# Patient Record
Sex: Female | Born: 1940
Health system: Southern US, Community
[De-identification: ages and names within clinical notes are randomized; demographics above are authoritative.]

## PROBLEM LIST (undated history)

## (undated) DIAGNOSIS — Z8719 Personal history of other diseases of the digestive system: Secondary | ICD-10-CM

## (undated) DIAGNOSIS — Z923 Personal history of irradiation: Secondary | ICD-10-CM

## (undated) DIAGNOSIS — E559 Vitamin D deficiency, unspecified: Secondary | ICD-10-CM

## (undated) DIAGNOSIS — C50919 Malignant neoplasm of unspecified site of unspecified female breast: Secondary | ICD-10-CM

## (undated) DIAGNOSIS — K219 Gastro-esophageal reflux disease without esophagitis: Secondary | ICD-10-CM

## (undated) DIAGNOSIS — E039 Hypothyroidism, unspecified: Secondary | ICD-10-CM

## (undated) DIAGNOSIS — D649 Anemia, unspecified: Secondary | ICD-10-CM

## (undated) DIAGNOSIS — Z8669 Personal history of other diseases of the nervous system and sense organs: Secondary | ICD-10-CM

## (undated) DIAGNOSIS — Z9889 Other specified postprocedural states: Secondary | ICD-10-CM

## (undated) DIAGNOSIS — M1990 Unspecified osteoarthritis, unspecified site: Secondary | ICD-10-CM

## (undated) DIAGNOSIS — R112 Nausea with vomiting, unspecified: Secondary | ICD-10-CM

## (undated) HISTORY — PX: BREAST EXCISIONAL BIOPSY: SUR124

## (undated) HISTORY — PX: BREAST LUMPECTOMY: SHX2

## (undated) HISTORY — PX: CATARACT EXTRACTION, BILATERAL: SHX1313

---

## 1998-02-16 ENCOUNTER — Ambulatory Visit (HOSPITAL_COMMUNITY): Admission: RE | Admit: 1998-02-16 | Discharge: 1998-02-16 | Payer: Self-pay | Admitting: *Deleted

## 2000-04-22 ENCOUNTER — Other Ambulatory Visit: Admission: RE | Admit: 2000-04-22 | Discharge: 2000-04-22 | Payer: Self-pay | Admitting: Obstetrics and Gynecology

## 2000-05-07 ENCOUNTER — Encounter: Admission: RE | Admit: 2000-05-07 | Discharge: 2000-05-07 | Payer: Self-pay | Admitting: Obstetrics and Gynecology

## 2000-05-07 ENCOUNTER — Encounter: Payer: Self-pay | Admitting: Obstetrics and Gynecology

## 2001-07-03 ENCOUNTER — Other Ambulatory Visit: Admission: RE | Admit: 2001-07-03 | Discharge: 2001-07-03 | Payer: Self-pay | Admitting: Obstetrics and Gynecology

## 2001-07-08 ENCOUNTER — Encounter: Admission: RE | Admit: 2001-07-08 | Discharge: 2001-07-08 | Payer: Self-pay | Admitting: Obstetrics and Gynecology

## 2001-07-08 ENCOUNTER — Encounter: Payer: Self-pay | Admitting: Obstetrics and Gynecology

## 2002-08-20 ENCOUNTER — Other Ambulatory Visit: Admission: RE | Admit: 2002-08-20 | Discharge: 2002-08-20 | Payer: Self-pay | Admitting: Obstetrics and Gynecology

## 2004-01-07 ENCOUNTER — Other Ambulatory Visit: Admission: RE | Admit: 2004-01-07 | Discharge: 2004-01-07 | Payer: Self-pay | Admitting: Obstetrics and Gynecology

## 2005-08-20 ENCOUNTER — Ambulatory Visit (HOSPITAL_COMMUNITY): Admission: RE | Admit: 2005-08-20 | Discharge: 2005-08-20 | Payer: Self-pay | Admitting: Family Medicine

## 2006-06-12 ENCOUNTER — Encounter: Admission: RE | Admit: 2006-06-12 | Discharge: 2006-06-12 | Payer: Self-pay | Admitting: Obstetrics and Gynecology

## 2006-06-12 ENCOUNTER — Encounter (INDEPENDENT_AMBULATORY_CARE_PROVIDER_SITE_OTHER): Payer: Self-pay | Admitting: Diagnostic Radiology

## 2006-06-12 ENCOUNTER — Encounter (INDEPENDENT_AMBULATORY_CARE_PROVIDER_SITE_OTHER): Payer: Self-pay | Admitting: *Deleted

## 2006-06-21 ENCOUNTER — Encounter: Admission: RE | Admit: 2006-06-21 | Discharge: 2006-06-21 | Payer: Self-pay | Admitting: Obstetrics and Gynecology

## 2006-07-08 ENCOUNTER — Encounter: Admission: RE | Admit: 2006-07-08 | Discharge: 2006-07-08 | Payer: Self-pay | Admitting: General Surgery

## 2006-07-08 ENCOUNTER — Ambulatory Visit (HOSPITAL_BASED_OUTPATIENT_CLINIC_OR_DEPARTMENT_OTHER): Admission: RE | Admit: 2006-07-08 | Discharge: 2006-07-08 | Payer: Self-pay | Admitting: General Surgery

## 2006-07-08 ENCOUNTER — Encounter (INDEPENDENT_AMBULATORY_CARE_PROVIDER_SITE_OTHER): Payer: Self-pay | Admitting: *Deleted

## 2006-07-09 ENCOUNTER — Ambulatory Visit: Payer: Self-pay | Admitting: Oncology

## 2006-07-15 ENCOUNTER — Ambulatory Visit: Admission: RE | Admit: 2006-07-15 | Discharge: 2006-11-06 | Payer: Self-pay | Admitting: Radiation Oncology

## 2006-07-16 LAB — CBC WITH DIFFERENTIAL/PLATELET
Basophils Absolute: 0 10*3/uL (ref 0.0–0.1)
EOS%: 3.4 % (ref 0.0–7.0)
HCT: 40.4 % (ref 34.8–46.6)
HGB: 13.9 g/dL (ref 11.6–15.9)
MCH: 30.7 pg (ref 26.0–34.0)
MCV: 89.4 fL (ref 81.0–101.0)
MONO%: 7.8 % (ref 0.0–13.0)
NEUT%: 59.8 % (ref 39.6–76.8)

## 2006-07-22 ENCOUNTER — Ambulatory Visit (HOSPITAL_COMMUNITY): Admission: RE | Admit: 2006-07-22 | Discharge: 2006-07-22 | Payer: Self-pay | Admitting: Oncology

## 2006-10-11 ENCOUNTER — Ambulatory Visit: Admission: RE | Admit: 2006-10-11 | Discharge: 2006-11-06 | Payer: Self-pay | Admitting: Radiation Oncology

## 2006-12-04 ENCOUNTER — Ambulatory Visit: Payer: Self-pay | Admitting: Oncology

## 2006-12-09 LAB — LACTATE DEHYDROGENASE: LDH: 177 U/L (ref 94–250)

## 2006-12-09 LAB — CBC WITH DIFFERENTIAL/PLATELET
BASO%: 0.4 % (ref 0.0–2.0)
EOS%: 2.8 % (ref 0.0–7.0)
HCT: 38.2 % (ref 34.8–46.6)
LYMPH%: 22.7 % (ref 14.0–48.0)
MCH: 31.3 pg (ref 26.0–34.0)
MCHC: 34.9 g/dL (ref 32.0–36.0)
MONO#: 0.5 10*3/uL (ref 0.1–0.9)
NEUT%: 64.8 % (ref 39.6–76.8)
Platelets: 311 10*3/uL (ref 145–400)

## 2006-12-09 LAB — COMPREHENSIVE METABOLIC PANEL
Albumin: 4.1 g/dL (ref 3.5–5.2)
BUN: 19 mg/dL (ref 6–23)
CO2: 28 mEq/L (ref 19–32)
Calcium: 9.4 mg/dL (ref 8.4–10.5)
Chloride: 108 mEq/L (ref 96–112)
Creatinine, Ser: 0.79 mg/dL (ref 0.40–1.20)
Glucose, Bld: 115 mg/dL — ABNORMAL HIGH (ref 70–99)
Potassium: 5 mEq/L (ref 3.5–5.3)

## 2006-12-25 ENCOUNTER — Ambulatory Visit: Payer: Self-pay | Admitting: Gastroenterology

## 2007-01-03 ENCOUNTER — Ambulatory Visit: Payer: Self-pay | Admitting: Gastroenterology

## 2007-01-03 HISTORY — PX: COLONOSCOPY: SHX174

## 2007-03-07 ENCOUNTER — Ambulatory Visit: Payer: Self-pay | Admitting: Oncology

## 2007-04-25 ENCOUNTER — Ambulatory Visit: Payer: Self-pay | Admitting: Oncology

## 2007-06-16 ENCOUNTER — Encounter: Admission: RE | Admit: 2007-06-16 | Discharge: 2007-06-16 | Payer: Self-pay | Admitting: *Deleted

## 2007-07-24 ENCOUNTER — Ambulatory Visit: Payer: Self-pay | Admitting: Oncology

## 2007-08-01 LAB — CBC WITH DIFFERENTIAL/PLATELET
Basophils Absolute: 0 10*3/uL (ref 0.0–0.1)
Eosinophils Absolute: 0.1 10*3/uL (ref 0.0–0.5)
HGB: 13.8 g/dL (ref 11.6–15.9)
MONO%: 11.5 % (ref 0.0–13.0)
NEUT#: 3.8 10*3/uL (ref 1.5–6.5)
RBC: 4.48 10*6/uL (ref 3.70–5.32)
RDW: 12.9 % (ref 11.3–14.5)
WBC: 6.2 10*3/uL (ref 3.9–10.0)
lymph#: 1.6 10*3/uL (ref 0.9–3.3)

## 2007-08-01 LAB — COMPREHENSIVE METABOLIC PANEL
AST: 24 U/L (ref 0–37)
Albumin: 4.4 g/dL (ref 3.5–5.2)
Alkaline Phosphatase: 82 U/L (ref 39–117)
BUN: 23 mg/dL (ref 6–23)
Calcium: 9.6 mg/dL (ref 8.4–10.5)
Chloride: 107 mEq/L (ref 96–112)
Glucose, Bld: 77 mg/dL (ref 70–99)
Potassium: 4.3 mEq/L (ref 3.5–5.3)
Sodium: 143 mEq/L (ref 135–145)
Total Protein: 7 g/dL (ref 6.0–8.3)

## 2007-08-01 LAB — CANCER ANTIGEN 27.29: CA 27.29: 15 U/mL (ref 0–39)

## 2007-09-12 ENCOUNTER — Encounter: Admission: RE | Admit: 2007-09-12 | Discharge: 2007-09-12 | Payer: Self-pay | Admitting: Obstetrics and Gynecology

## 2007-09-15 ENCOUNTER — Ambulatory Visit: Payer: Self-pay | Admitting: Gastroenterology

## 2007-09-23 ENCOUNTER — Ambulatory Visit: Payer: Self-pay | Admitting: Gastroenterology

## 2007-09-23 ENCOUNTER — Encounter: Payer: Self-pay | Admitting: Gastroenterology

## 2007-09-23 HISTORY — PX: UPPER GI ENDOSCOPY: SHX6162

## 2007-10-27 ENCOUNTER — Ambulatory Visit: Payer: Self-pay | Admitting: Gastroenterology

## 2008-02-04 ENCOUNTER — Telehealth: Payer: Self-pay | Admitting: Gastroenterology

## 2008-02-05 ENCOUNTER — Encounter: Payer: Self-pay | Admitting: Family Medicine

## 2008-02-10 ENCOUNTER — Ambulatory Visit: Payer: Self-pay | Admitting: Oncology

## 2008-03-15 LAB — CBC WITH DIFFERENTIAL/PLATELET
Eosinophils Absolute: 0.1 10*3/uL (ref 0.0–0.5)
MCV: 89.6 fL (ref 81.0–101.0)
MONO%: 9.2 % (ref 0.0–13.0)
NEUT#: 3 10*3/uL (ref 1.5–6.5)
RBC: 4.31 10*6/uL (ref 3.70–5.32)
RDW: 13.1 % (ref 11.3–14.5)
WBC: 5 10*3/uL (ref 3.9–10.0)
lymph#: 1.4 10*3/uL (ref 0.9–3.3)

## 2008-03-16 LAB — COMPREHENSIVE METABOLIC PANEL
AST: 26 U/L (ref 0–37)
Albumin: 4.2 g/dL (ref 3.5–5.2)
Alkaline Phosphatase: 83 U/L (ref 39–117)
Glucose, Bld: 93 mg/dL (ref 70–99)
Potassium: 4.5 mEq/L (ref 3.5–5.3)
Sodium: 139 mEq/L (ref 135–145)
Total Protein: 6.7 g/dL (ref 6.0–8.3)

## 2008-03-16 LAB — CANCER ANTIGEN 27.29: CA 27.29: 17 U/mL (ref 0–39)

## 2008-06-16 ENCOUNTER — Encounter: Admission: RE | Admit: 2008-06-16 | Discharge: 2008-06-16 | Payer: Self-pay | Admitting: Oncology

## 2008-07-13 ENCOUNTER — Ambulatory Visit: Payer: Self-pay | Admitting: Oncology

## 2008-07-13 LAB — COMPREHENSIVE METABOLIC PANEL
AST: 23 U/L (ref 0–37)
Albumin: 4.2 g/dL (ref 3.5–5.2)
BUN: 23 mg/dL (ref 6–23)
CO2: 25 mEq/L (ref 19–32)
Calcium: 9.7 mg/dL (ref 8.4–10.5)
Chloride: 105 mEq/L (ref 96–112)
Glucose, Bld: 84 mg/dL (ref 70–99)
Potassium: 4.4 mEq/L (ref 3.5–5.3)

## 2008-07-13 LAB — CBC WITH DIFFERENTIAL/PLATELET
Basophils Absolute: 0 10*3/uL (ref 0.0–0.1)
Eosinophils Absolute: 0.1 10*3/uL (ref 0.0–0.5)
HCT: 40.7 % (ref 34.8–46.6)
HGB: 13.8 g/dL (ref 11.6–15.9)
MONO#: 0.5 10*3/uL (ref 0.1–0.9)
NEUT#: 3.6 10*3/uL (ref 1.5–6.5)
NEUT%: 63.4 % (ref 39.6–76.8)
RDW: 13.3 % (ref 11.3–14.5)
lymph#: 1.4 10*3/uL (ref 0.9–3.3)

## 2008-07-13 LAB — CANCER ANTIGEN 27.29: CA 27.29: 16 U/mL (ref 0–39)

## 2008-10-12 ENCOUNTER — Ambulatory Visit: Payer: Self-pay | Admitting: Oncology

## 2008-10-20 LAB — COMPREHENSIVE METABOLIC PANEL
AST: 29 U/L (ref 0–37)
Alkaline Phosphatase: 86 U/L (ref 39–117)
BUN: 27 mg/dL — ABNORMAL HIGH (ref 6–23)
Glucose, Bld: 91 mg/dL (ref 70–99)
Potassium: 3.8 mEq/L (ref 3.5–5.3)
Sodium: 142 mEq/L (ref 135–145)
Total Bilirubin: 0.6 mg/dL (ref 0.3–1.2)
Total Protein: 6.5 g/dL (ref 6.0–8.3)

## 2008-10-20 LAB — CBC WITH DIFFERENTIAL/PLATELET
EOS%: 1.2 % (ref 0.0–7.0)
Eosinophils Absolute: 0.1 10*3/uL (ref 0.0–0.5)
LYMPH%: 24.8 % (ref 14.0–49.7)
MCH: 30.9 pg (ref 25.1–34.0)
MCV: 90.9 fL (ref 79.5–101.0)
MONO%: 7.7 % (ref 0.0–14.0)
NEUT#: 4.2 10*3/uL (ref 1.5–6.5)
Platelets: 296 10*3/uL (ref 145–400)
RBC: 4.28 10*6/uL (ref 3.70–5.45)
RDW: 13.1 % (ref 11.2–14.5)

## 2008-10-22 LAB — CANCER ANTIGEN 27.29: CA 27.29: 17 U/mL (ref 0–39)

## 2008-10-22 LAB — VITAMIN D 25 HYDROXY (VIT D DEFICIENCY, FRACTURES): Vit D, 25-Hydroxy: 33 ng/mL (ref 30–89)

## 2009-04-25 ENCOUNTER — Ambulatory Visit: Payer: Self-pay | Admitting: Oncology

## 2009-04-26 ENCOUNTER — Encounter: Admission: RE | Admit: 2009-04-26 | Discharge: 2009-04-26 | Payer: Self-pay | Admitting: Obstetrics and Gynecology

## 2009-04-26 LAB — CBC WITH DIFFERENTIAL/PLATELET
BASO%: 0.4 % (ref 0.0–2.0)
Eosinophils Absolute: 0.1 10*3/uL (ref 0.0–0.5)
LYMPH%: 29.5 % (ref 14.0–49.7)
MCHC: 34.1 g/dL (ref 31.5–36.0)
MONO#: 0.5 10*3/uL (ref 0.1–0.9)
NEUT#: 3.2 10*3/uL (ref 1.5–6.5)
Platelets: 328 10*3/uL (ref 145–400)
RBC: 4.35 10*6/uL (ref 3.70–5.45)
RDW: 12.7 % (ref 11.2–14.5)
WBC: 5.3 10*3/uL (ref 3.9–10.3)
lymph#: 1.6 10*3/uL (ref 0.9–3.3)

## 2009-04-27 LAB — CANCER ANTIGEN 27.29: CA 27.29: 25 U/mL (ref 0–39)

## 2009-04-27 LAB — COMPREHENSIVE METABOLIC PANEL
ALT: 16 U/L (ref 0–35)
Albumin: 4.2 g/dL (ref 3.5–5.2)
CO2: 23 mEq/L (ref 19–32)
Calcium: 9.3 mg/dL (ref 8.4–10.5)
Chloride: 107 mEq/L (ref 96–112)
Glucose, Bld: 81 mg/dL (ref 70–99)
Potassium: 4 mEq/L (ref 3.5–5.3)
Sodium: 140 mEq/L (ref 135–145)
Total Bilirubin: 0.4 mg/dL (ref 0.3–1.2)
Total Protein: 6.8 g/dL (ref 6.0–8.3)

## 2009-04-27 LAB — LACTATE DEHYDROGENASE: LDH: 205 U/L (ref 94–250)

## 2009-08-22 DIAGNOSIS — R7301 Impaired fasting glucose: Secondary | ICD-10-CM | POA: Insufficient documentation

## 2009-08-22 DIAGNOSIS — E039 Hypothyroidism, unspecified: Secondary | ICD-10-CM | POA: Insufficient documentation

## 2009-08-22 DIAGNOSIS — G2581 Restless legs syndrome: Secondary | ICD-10-CM | POA: Insufficient documentation

## 2009-10-14 ENCOUNTER — Ambulatory Visit: Payer: Self-pay | Admitting: Oncology

## 2009-10-18 LAB — CANCER ANTIGEN 27.29: CA 27.29: 18 U/mL (ref 0–39)

## 2009-10-18 LAB — COMPREHENSIVE METABOLIC PANEL
AST: 25 U/L (ref 0–37)
Alkaline Phosphatase: 82 U/L (ref 39–117)
Glucose, Bld: 99 mg/dL (ref 70–99)
Sodium: 140 mEq/L (ref 135–145)
Total Bilirubin: 0.3 mg/dL (ref 0.3–1.2)
Total Protein: 6.5 g/dL (ref 6.0–8.3)

## 2009-10-18 LAB — CBC WITH DIFFERENTIAL/PLATELET
BASO%: 0.4 % (ref 0.0–2.0)
EOS%: 2.5 % (ref 0.0–7.0)
Eosinophils Absolute: 0.2 10*3/uL (ref 0.0–0.5)
LYMPH%: 28.9 % (ref 14.0–49.7)
MCH: 30.6 pg (ref 25.1–34.0)
MCHC: 33.9 g/dL (ref 31.5–36.0)
MCV: 90.4 fL (ref 79.5–101.0)
MONO%: 8.3 % (ref 0.0–14.0)
Platelets: 323 10*3/uL (ref 145–400)
RBC: 4.32 10*6/uL (ref 3.70–5.45)

## 2009-10-25 ENCOUNTER — Encounter: Payer: Self-pay | Admitting: Gastroenterology

## 2009-11-03 ENCOUNTER — Encounter: Admission: RE | Admit: 2009-11-03 | Discharge: 2009-11-03 | Payer: Self-pay | Admitting: Oncology

## 2009-12-14 ENCOUNTER — Ambulatory Visit: Payer: Self-pay | Admitting: Oncology

## 2009-12-16 ENCOUNTER — Encounter: Admission: RE | Admit: 2009-12-16 | Discharge: 2009-12-16 | Payer: Self-pay | Admitting: Oncology

## 2010-05-31 ENCOUNTER — Ambulatory Visit: Payer: Self-pay | Admitting: Oncology

## 2010-06-05 LAB — CBC WITH DIFFERENTIAL/PLATELET
BASO%: 0.4 % (ref 0.0–2.0)
Eosinophils Absolute: 0.1 10*3/uL (ref 0.0–0.5)
HCT: 39.2 % (ref 34.8–46.6)
MCHC: 34.5 g/dL (ref 31.5–36.0)
MONO#: 0.5 10*3/uL (ref 0.1–0.9)
NEUT#: 3.6 10*3/uL (ref 1.5–6.5)
NEUT%: 64.6 % (ref 38.4–76.8)
Platelets: 302 10*3/uL (ref 145–400)
RBC: 4.31 10*6/uL (ref 3.70–5.45)
WBC: 5.7 10*3/uL (ref 3.9–10.3)
lymph#: 1.4 10*3/uL (ref 0.9–3.3)

## 2010-06-05 LAB — COMPREHENSIVE METABOLIC PANEL
ALT: 21 U/L (ref 0–35)
Albumin: 4.1 g/dL (ref 3.5–5.2)
CO2: 26 mEq/L (ref 19–32)
Calcium: 9.4 mg/dL (ref 8.4–10.5)
Chloride: 105 mEq/L (ref 96–112)
Glucose, Bld: 94 mg/dL (ref 70–99)
Sodium: 141 mEq/L (ref 135–145)
Total Protein: 6.5 g/dL (ref 6.0–8.3)

## 2010-06-05 LAB — LACTATE DEHYDROGENASE: LDH: 175 U/L (ref 94–250)

## 2010-06-05 LAB — CANCER ANTIGEN 27.29: CA 27.29: 24 U/mL (ref 0–39)

## 2010-09-02 ENCOUNTER — Encounter: Payer: Self-pay | Admitting: Obstetrics and Gynecology

## 2010-09-03 ENCOUNTER — Encounter: Payer: Self-pay | Admitting: Oncology

## 2010-09-12 NOTE — Letter (Signed)
[facility]   Imported By: Lennie Odor 11/23/2009 15:33:20  _____________________________________________________________________  External Attachment:    Type:   Image     Comment:   External Document

## 2010-09-19 DIAGNOSIS — E785 Hyperlipidemia, unspecified: Secondary | ICD-10-CM | POA: Insufficient documentation

## 2010-11-24 ENCOUNTER — Other Ambulatory Visit: Payer: Self-pay | Admitting: Internal Medicine

## 2010-11-24 DIAGNOSIS — Z9889 Other specified postprocedural states: Secondary | ICD-10-CM

## 2010-12-05 ENCOUNTER — Other Ambulatory Visit: Payer: Self-pay | Admitting: Oncology

## 2010-12-05 ENCOUNTER — Encounter (HOSPITAL_BASED_OUTPATIENT_CLINIC_OR_DEPARTMENT_OTHER): Payer: Medicare Other | Admitting: Oncology

## 2010-12-05 DIAGNOSIS — Z853 Personal history of malignant neoplasm of breast: Secondary | ICD-10-CM

## 2010-12-05 DIAGNOSIS — Z170 Estrogen receptor positive status [ER+]: Secondary | ICD-10-CM

## 2010-12-05 DIAGNOSIS — C50919 Malignant neoplasm of unspecified site of unspecified female breast: Secondary | ICD-10-CM

## 2010-12-05 LAB — COMPREHENSIVE METABOLIC PANEL
Albumin: 4.4 g/dL (ref 3.5–5.2)
BUN: 24 mg/dL — ABNORMAL HIGH (ref 6–23)
Calcium: 9.5 mg/dL (ref 8.4–10.5)
Chloride: 106 mEq/L (ref 96–112)
Glucose, Bld: 86 mg/dL (ref 70–99)
Potassium: 4.1 mEq/L (ref 3.5–5.3)

## 2010-12-05 LAB — CBC WITH DIFFERENTIAL/PLATELET
Basophils Absolute: 0 10*3/uL (ref 0.0–0.1)
Eosinophils Absolute: 0.1 10*3/uL (ref 0.0–0.5)
HCT: 39.2 % (ref 34.8–46.6)
HGB: 13.3 g/dL (ref 11.6–15.9)
MCV: 89.5 fL (ref 79.5–101.0)
MONO%: 7.7 % (ref 0.0–14.0)
NEUT#: 4.3 10*3/uL (ref 1.5–6.5)
RDW: 13 % (ref 11.2–14.5)
lymph#: 1.8 10*3/uL (ref 0.9–3.3)

## 2010-12-07 ENCOUNTER — Ambulatory Visit
Admission: RE | Admit: 2010-12-07 | Discharge: 2010-12-07 | Disposition: A | Discharge status: Home / Self Care | Payer: Medicare Other | Source: Ambulatory Visit | Attending: Internal Medicine | Admitting: Internal Medicine

## 2010-12-07 DIAGNOSIS — Z9889 Other specified postprocedural states: Secondary | ICD-10-CM

## 2010-12-26 NOTE — Assessment & Plan Note (Signed)
[facility]                         GASTROENTEROLOGY OFFICE NOTE   NAME:Rebecca [name], Rebecca [name]                       MRN:          [id number]  DATE:09/15/2007                            DOB:          08/02/1941    REFERRING PHYSICIAN:  Kari Baars, M.D.   REASON FOR CONSULTATION:  Dysphagia and GERD.   HISTORY OF PRESENT ILLNESS:  Mrs. Coderre is a 70 year old white female  that I saw previously for a directly scheduled screening colonoscopy in  May of 2008.  The colonoscopy was normal.  She relates problems over the  past few months with substernal burning chest pain and indigestion.  In  addition, she has had dysphagia with breads.  She has been taking Pepcid  OTC on a regular basis, and all of her symptoms have improved.  She has  no odynophagia, abdominal pain, nausea, vomiting, melena, hematochezia  or weight loss.   PAST MEDICAL HISTORY:  1. Hypothyroidism.  2. Left breast cancer status post lumpectomy and sentinel node      dissection December 2007.  3. Restless leg syndrome.  4. Hyperlipidemia.   Social history and review of systems per the handwritten form.   CURRENT MEDICATIONS:  Listed on the chart, updated and reviewed.   MEDICATION ALLERGIES:  None known.   PHYSICAL EXAMINATION:  Well developed, well nourished, no acute  distress.  Height :  5 feet 6 inches.  Weight:  151.6.  Blood pressure:  128/80.  Pulse:  60, regular.  HEENT:  Anicteric sclerae.  Oropharynx clear.  CHEST:  Clear to auscultation bilaterally.  CARDIAC:  Regular rate and rhythm without murmurs.  ABDOMEN:  Soft, nontender, nondistended.  Normoactive bowel sounds.  No  palpable organomegaly, masses or hernias.  NEUROLOGICAL:  Alert and oriented x3.  Grossly nonfocal.   ASSESSMENT/PLAN:  1. Gastrointestinal reflux disease with dysphagia.  Rule out      stricture.  Rule out esophagitis.  Less likely, upper      gastrointestinal tract neoplasms.  Continue Pepcid and  begin all      standard antireflux measures.  Risks, benefits and alternatives to      upper endoscopy with possible biopsy and possible dilation      discussed with the patient.  She consents to proceed.      This will be scheduled electively.  2. Personal history of breast cancer.  Colonoscopy recommended in May      2013.     Venita Lick. Russella Dar, MD, [facility]  Electronically Signed    MTS/MedQ  DD: 09/15/2007  DT: 09/15/2007  Job #: [id number]   cc:   Kari Baars, M.D.

## 2010-12-29 NOTE — Op Note (Signed)
NAMEARMANIE, Rebecca [name] NO.:  [id number]   MEDICAL RECORD NO.:  [id number]          PATIENT TYPE:  AMB   LOCATION:  DSC                          FACILITY:  [facility]   PHYSICIAN:  Rose Phi. Maple Hudson, M.D.   DATE OF BIRTH:  03-Jan-1941   DATE OF PROCEDURE:  07/08/2006  DATE OF DISCHARGE:                                 OPERATIVE REPORT   PREOPERATIVE DIAGNOSIS:  Stage I carcinoma of the left breast.   POSTOPERATIVE DIAGNOSIS:  Stage I carcinoma of the left breast.   OPERATION:  1. Blue dye injection.  2. Left partial mastectomy with needle localization as best by mammogram.  3. Left axillary sentinel lymph node biopsy.   SURGEON:  Rose Phi. Maple Hudson, M.D.   ANESTHESIA:  General.   OPERATIVE PROCEDURE:  Prior to coming to the operating room a localizing  wire had been placed for the lesion that sits at the 12:00 position of the  left breast.  In addition, 1 mCi of Technetium Sulfa Colloid was injected  intradermally.   After suitable general anesthesia was induced, the patient was placed in the  supine position with the arms extended on the arm board.  5 cc of a mixture  of 2 cc of Methylene Blue and 3 cc of injectable saline was injected in the  subareolar tissue and the breast gently massaged for three minutes.  We then  prepped and draped the breast and axilla.   A transverse incision at the 12:00 position of the left breast was then made  using the previously placed wire as a guide.  The wire was exposed in the  incision and a wide excision of the wire and surrounding tissue was carried  out.  Hemostasis obtained with the cautery. Specimen oriented for the  pathologist and submitted to the radiologist for a specimen mammogram.   While that was being done, the transverse left axillary incision was made  with dissection through the subcutaneous tissue to the clavipectoral fascia.  Deep to the fascia was one blue and hot lymph node.  There were no other  blue, hot or  palpable lymph nodes.  That was then submitted to the  pathologist as a sentinel node.  Specimen mammogram confirmed the removal of  the lesion and the tissue was then submitted to the pathologist to evaluate  the margins.   Touch prep on the sentinel nodes was negative for metastatic disease and the  margins were clean.   Both incisions were injected with an anesthetic mixture of Marcaine and  Xylocaine and then closed with 3-0 Vicryl and subcuticular 4-0 Monocryl and  then Dermabond glue.   After this had dried, the dressings were applied and the patient transferred  to the recovery room in satisfactory condition having tolerated the  procedure well.      Rose Phi. Maple Hudson, M.D.  Electronically Signed     PRY/MEDQ  D:  07/08/2006  T:  07/09/2006  Job:  [id number]

## 2011-01-03 ENCOUNTER — Encounter: Payer: Medicare Other | Admitting: Oncology

## 2011-05-25 ENCOUNTER — Other Ambulatory Visit: Payer: Self-pay | Admitting: Dermatology

## 2011-06-07 ENCOUNTER — Other Ambulatory Visit: Payer: Self-pay | Admitting: Dermatology

## 2011-07-10 ENCOUNTER — Other Ambulatory Visit: Payer: Self-pay | Admitting: Oncology

## 2011-07-10 ENCOUNTER — Other Ambulatory Visit: Payer: Medicare Other

## 2011-07-10 ENCOUNTER — Ambulatory Visit (HOSPITAL_BASED_OUTPATIENT_CLINIC_OR_DEPARTMENT_OTHER): Payer: Medicare Other | Admitting: Oncology

## 2011-07-10 DIAGNOSIS — E559 Vitamin D deficiency, unspecified: Secondary | ICD-10-CM

## 2011-07-10 DIAGNOSIS — Z1231 Encounter for screening mammogram for malignant neoplasm of breast: Secondary | ICD-10-CM

## 2011-07-10 DIAGNOSIS — Z170 Estrogen receptor positive status [ER+]: Secondary | ICD-10-CM

## 2011-07-10 DIAGNOSIS — C50919 Malignant neoplasm of unspecified site of unspecified female breast: Secondary | ICD-10-CM

## 2011-07-10 LAB — CBC WITH DIFFERENTIAL/PLATELET
Eosinophils Absolute: 0.1 10*3/uL (ref 0.0–0.5)
HCT: 40.6 % (ref 34.8–46.6)
LYMPH%: 28.3 % (ref 14.0–49.7)
MCHC: 33.1 g/dL (ref 31.5–36.0)
MCV: 90.6 fL (ref 79.5–101.0)
MONO#: 0.5 10*3/uL (ref 0.1–0.9)
NEUT#: 4.3 10*3/uL (ref 1.5–6.5)
NEUT%: 62.5 % (ref 38.4–76.8)
Platelets: 319 10*3/uL (ref 145–400)
WBC: 6.9 10*3/uL (ref 3.9–10.3)

## 2011-07-10 LAB — COMPREHENSIVE METABOLIC PANEL
CO2: 27 mEq/L (ref 19–32)
Creatinine, Ser: 0.64 mg/dL (ref 0.50–1.10)
Glucose, Bld: 88 mg/dL (ref 70–99)
Total Bilirubin: 0.3 mg/dL (ref 0.3–1.2)

## 2011-08-05 NOTE — Progress Notes (Signed)
Hematology and Oncology Follow Up Visit  TABITHA RIGGINS [id number] 05-01-1941 70 y.o. 08/05/2011 5:40 PM  W. Eric Form  DIAGNOSIS: Breast cancer Encounter Diagnoses  Name Primary?  . Malignant neoplasm of breast (female), unspecified site   . Unspecified vitamin D deficiency   . Other screening mammogram      PAST THERAPY: T1 C. N0 breast cancer status post lumpectomy November 07, status post radiation therapy completed 10/02/2006 status post of Aromasin Arimidex therapy now on Femara since September 2010.  Interim History:  Vision is doing well. She is tolerating Femara well. She denies headaches blurred vision shortness of breath or coughing. Last mammogram in April 2012 was within normal limits. Last bone density test today February 2012 was within normal limits.  Medications: I have reviewed the patient's current medications.  Allergies: No Known Allergies  Past Medical History, Surgical history, Social history, and Family History were reviewed and updated.  Review of Systems: Constitutional:  Negative for fever, chills, night sweats, anorexia, weight loss, pain. Cardiovascular: no chest pain or dyspnea on exertion Respiratory: no cough, shortness of breath, or wheezing Neurological: negative Dermatological: negative positive for dry skin ENT: negative Skin Gastrointestinal: negative Genito-Urinary: negative Hematological and Lymphatic: negative Breast: negative Musculoskeletal: negative Remaining ROS negative.  Physical Exam:  Blood pressure 144/82, pulse 69, temperature 98.1 F (36.7 C), temperature source Oral, weight 150 lb 1.6 oz (68.085 kg).  ECOG:    General appearance: alert, cooperative and appears stated age Head: Normocephalic, without obvious abnormality, atraumatic Throat: lips, mucosa, and tongue normal; teeth and gums normal Resp: clear to auscultation bilaterally and normal percussion bilaterally Breasts: normal appearance, no masses or  tenderness Cardio: regular rate and rhythm, S1, S2 normal, no murmur, click, rub or gallop GI: soft, non-tender; bowel sounds normal; no masses,  no organomegaly Extremities: extremities normal, atraumatic, no cyanosis or edema Neurologic: Grossly normal   Lab Results: Lab Results  Component Value Date   WBC 6.9 07/10/2011   HGB 13.5 07/10/2011   HCT 40.6 07/10/2011   MCV 90.6 07/10/2011   PLT 319 07/10/2011     Chemistry      Component Value Date/Time   NA 141 07/10/2011 1526   K 4.9 07/10/2011 1526   CL 105 07/10/2011 1526   CO2 27 07/10/2011 1526   BUN 22 07/10/2011 1526   CREATININE 0.64 07/10/2011 1526      Component Value Date/Time   CALCIUM 9.5 07/10/2011 1526   ALKPHOS 79 07/10/2011 1526   AST 35 07/10/2011 1526   ALT 30 07/10/2011 1526   BILITOT 0.3 07/10/2011 1526       Radiological Studies:  No results found.   IMPRESSIONS AND PLAN: A 70 y.o. female with a Street of her negative ER/PR positive breast cancer on Femara. Tolerating this well. I will see her in 6 months time with followup imaging studies. She is approaching the five-year mark of AI therapy and we alluded to the possibility of stopping this.     Spent more than half the time coordinating care.    Syniah Berne 12/23/20125:40 PM

## 2011-09-13 DIAGNOSIS — R030 Elevated blood-pressure reading, without diagnosis of hypertension: Secondary | ICD-10-CM | POA: Insufficient documentation

## 2011-09-18 ENCOUNTER — Telehealth: Payer: Self-pay

## 2011-09-18 ENCOUNTER — Other Ambulatory Visit: Payer: Self-pay

## 2011-09-18 ENCOUNTER — Telehealth: Payer: Self-pay | Admitting: *Deleted

## 2011-09-18 DIAGNOSIS — C50919 Malignant neoplasm of unspecified site of unspecified female breast: Secondary | ICD-10-CM

## 2011-09-18 NOTE — Telephone Encounter (Signed)
patient over the phone the new date and time of her scan

## 2011-09-18 NOTE — Telephone Encounter (Signed)
Received call from pt stating she has had rt back to rt chest pain x 2-3 weeks.  Pt has hx of left breast lumpectomy; taking Femara x 5 years. Pt reports the pain to be intermittent "throbbing" and seems to worsen at night.  It has gotten progressively worse over the last 2 weeks. Denies any movements, including inspiration, sneezing or coughing worsens or improves the pain.  Pt saw "Dr Alver Fisher PA" yesterday and reports they "decided it wasn't my heart, but they don't know what's causing this."   Pt denies need for medications, stating "I just want to know what it is."  Per Dr Donnie Coffin, bone scan ordered.  Routed to schedulers and notified Jacki Cones, scheduler of order.  Pt aware to expect a call from scheduling. dph

## 2011-09-18 NOTE — Telephone Encounter (Signed)
Received return call from pt stating she has a "full physical and bone density" planned with Dr Clelia Croft on Friday, so she does not want to have bone scan as ordered by Dr Donnie Coffin.  MD aware. dph

## 2011-09-19 ENCOUNTER — Encounter (HOSPITAL_COMMUNITY)
Admission: RE | Admit: 2011-09-19 | Discharge: 2011-09-19 | Disposition: A | Discharge status: Home / Self Care | Payer: Medicare Other | Source: Ambulatory Visit | Attending: Oncology | Admitting: Oncology

## 2011-09-19 ENCOUNTER — Encounter (HOSPITAL_COMMUNITY): Payer: Self-pay

## 2011-09-19 DIAGNOSIS — C50919 Malignant neoplasm of unspecified site of unspecified female breast: Secondary | ICD-10-CM | POA: Insufficient documentation

## 2011-09-19 DIAGNOSIS — R52 Pain, unspecified: Secondary | ICD-10-CM | POA: Insufficient documentation

## 2011-09-19 HISTORY — DX: Malignant neoplasm of unspecified site of unspecified female breast: C50.919

## 2011-09-19 MED ORDER — TECHNETIUM TC 99M MEDRONATE IV KIT
23.9000 | PACK | Freq: Once | INTRAVENOUS | Status: AC | PRN
  Administered 2011-09-19: 23.9 via INTRAVENOUS

## 2011-09-20 ENCOUNTER — Telehealth: Payer: Self-pay | Admitting: *Deleted

## 2011-09-20 ENCOUNTER — Other Ambulatory Visit: Payer: Self-pay | Admitting: Oncology

## 2011-09-20 DIAGNOSIS — C50919 Malignant neoplasm of unspecified site of unspecified female breast: Secondary | ICD-10-CM

## 2011-09-20 NOTE — Telephone Encounter (Signed)
Pt. Calls for results of her bone scan.   Discussed  With Dr. Donnie Coffin:  No evidence of skeletal mets.  Dr. Donnie Coffin will see pt. In 2-3 weeks.  Called pt. Back and let her know results and if she does not hear from schedulers by tomorrow afternoon, please call us.

## 2011-09-24 ENCOUNTER — Other Ambulatory Visit: Payer: Medicare Other | Admitting: Lab

## 2011-09-24 ENCOUNTER — Ambulatory Visit: Payer: Medicare Other | Admitting: Oncology

## 2011-10-29 ENCOUNTER — Encounter: Payer: Self-pay | Admitting: Gastroenterology

## 2012-05-28 DIAGNOSIS — Z9849 Cataract extraction status, unspecified eye: Secondary | ICD-10-CM | POA: Insufficient documentation

## 2012-07-10 ENCOUNTER — Other Ambulatory Visit: Payer: Self-pay | Admitting: Oncology

## 2012-07-11 NOTE — Telephone Encounter (Signed)
Patient must make appt. with MD

## 2012-08-02 DIAGNOSIS — Z9889 Other specified postprocedural states: Secondary | ICD-10-CM | POA: Insufficient documentation

## 2012-09-23 ENCOUNTER — Other Ambulatory Visit: Payer: Self-pay | Admitting: Internal Medicine

## 2012-09-23 DIAGNOSIS — Z853 Personal history of malignant neoplasm of breast: Secondary | ICD-10-CM

## 2012-09-23 DIAGNOSIS — Z9889 Other specified postprocedural states: Secondary | ICD-10-CM

## 2012-10-07 ENCOUNTER — Ambulatory Visit
Admission: RE | Admit: 2012-10-07 | Discharge: 2012-10-07 | Disposition: A | Discharge status: Home / Self Care | Payer: Medicare HMO | Source: Ambulatory Visit | Attending: Internal Medicine | Admitting: Internal Medicine

## 2012-10-07 DIAGNOSIS — Z9889 Other specified postprocedural states: Secondary | ICD-10-CM

## 2012-10-07 DIAGNOSIS — Z853 Personal history of malignant neoplasm of breast: Secondary | ICD-10-CM

## 2012-12-31 ENCOUNTER — Other Ambulatory Visit: Payer: Self-pay | Admitting: Dermatology

## 2014-01-25 ENCOUNTER — Other Ambulatory Visit: Payer: Self-pay | Admitting: Dermatology

## 2014-03-09 ENCOUNTER — Other Ambulatory Visit: Payer: Self-pay | Admitting: Internal Medicine

## 2014-03-09 DIAGNOSIS — Z853 Personal history of malignant neoplasm of breast: Secondary | ICD-10-CM

## 2014-03-09 DIAGNOSIS — N6320 Unspecified lump in the left breast, unspecified quadrant: Secondary | ICD-10-CM

## 2014-03-15 ENCOUNTER — Ambulatory Visit
Admission: RE | Admit: 2014-03-15 | Discharge: 2014-03-15 | Disposition: A | Discharge status: Home / Self Care | Payer: Medicare HMO | Source: Ambulatory Visit | Attending: Internal Medicine | Admitting: Internal Medicine

## 2014-03-15 ENCOUNTER — Encounter (INDEPENDENT_AMBULATORY_CARE_PROVIDER_SITE_OTHER): Payer: Self-pay

## 2014-03-15 DIAGNOSIS — N6320 Unspecified lump in the left breast, unspecified quadrant: Secondary | ICD-10-CM

## 2014-03-15 DIAGNOSIS — Z853 Personal history of malignant neoplasm of breast: Secondary | ICD-10-CM

## 2014-05-21 ENCOUNTER — Encounter: Payer: Self-pay | Admitting: Gastroenterology

## 2015-04-25 DIAGNOSIS — L820 Inflamed seborrheic keratosis: Secondary | ICD-10-CM | POA: Diagnosis not present

## 2015-04-25 DIAGNOSIS — D485 Neoplasm of uncertain behavior of skin: Secondary | ICD-10-CM | POA: Diagnosis not present

## 2016-11-07 ENCOUNTER — Encounter: Payer: Self-pay | Admitting: Gastroenterology

## 2017-05-01 ENCOUNTER — Other Ambulatory Visit: Payer: Self-pay | Admitting: Internal Medicine

## 2017-05-01 ENCOUNTER — Ambulatory Visit
Admission: RE | Admit: 2017-05-01 | Discharge: 2017-05-01 | Disposition: A | Discharge status: Home / Self Care | Payer: Medicare Other | Source: Ambulatory Visit | Attending: Internal Medicine | Admitting: Internal Medicine

## 2017-05-01 DIAGNOSIS — Z1231 Encounter for screening mammogram for malignant neoplasm of breast: Secondary | ICD-10-CM

## 2017-05-01 HISTORY — DX: Personal history of irradiation: Z92.3

## 2017-05-03 ENCOUNTER — Ambulatory Visit: Payer: Medicare HMO

## 2018-04-11 ENCOUNTER — Other Ambulatory Visit: Payer: Self-pay | Admitting: Internal Medicine

## 2018-04-11 DIAGNOSIS — Z1231 Encounter for screening mammogram for malignant neoplasm of breast: Secondary | ICD-10-CM

## 2018-05-13 ENCOUNTER — Ambulatory Visit
Admission: RE | Admit: 2018-05-13 | Discharge: 2018-05-13 | Disposition: A | Discharge status: Home / Self Care | Payer: Medicare Other | Source: Ambulatory Visit | Attending: Internal Medicine | Admitting: Internal Medicine

## 2018-05-13 DIAGNOSIS — Z1231 Encounter for screening mammogram for malignant neoplasm of breast: Secondary | ICD-10-CM

## 2018-08-14 ENCOUNTER — Other Ambulatory Visit: Payer: Self-pay | Admitting: Obstetrics and Gynecology

## 2018-08-14 DIAGNOSIS — N6320 Unspecified lump in the left breast, unspecified quadrant: Secondary | ICD-10-CM

## 2018-08-21 ENCOUNTER — Ambulatory Visit
Admission: RE | Admit: 2018-08-21 | Discharge: 2018-08-21 | Disposition: A | Discharge status: Home / Self Care | Payer: Medicare Other | Source: Ambulatory Visit | Attending: Obstetrics and Gynecology | Admitting: Obstetrics and Gynecology

## 2018-08-21 DIAGNOSIS — N6320 Unspecified lump in the left breast, unspecified quadrant: Secondary | ICD-10-CM

## 2018-10-23 ENCOUNTER — Ambulatory Visit (INDEPENDENT_AMBULATORY_CARE_PROVIDER_SITE_OTHER): Payer: Medicare Other | Admitting: Orthopaedic Surgery

## 2018-10-23 ENCOUNTER — Ambulatory Visit (INDEPENDENT_AMBULATORY_CARE_PROVIDER_SITE_OTHER): Payer: Self-pay

## 2018-10-23 ENCOUNTER — Other Ambulatory Visit: Payer: Self-pay

## 2018-10-23 VITALS — Ht 67.0 in | Wt 146.0 lb

## 2018-10-23 DIAGNOSIS — M25552 Pain in left hip: Secondary | ICD-10-CM

## 2018-10-23 NOTE — Progress Notes (Signed)
Office Visit Note   Patient: Rebecca [name]           Date of Birth: 1941/05/29           MRN: [id number] Visit Date: 10/23/2018              Requested by: Marton Redwood, MD [address] [place], [place] [address] PCP: Marton Redwood, MD   Assessment & Plan: Visit Diagnoses:  1. Pain in left hip     Plan: Given her high level of activity and function and the fact that she looks at least a decade younger than she is, I feel that it is important to try an intra-articular steroid injection in her left hip joint under direct fluoroscopy or ultrasound by 1 of my partners.  We will see if we can get that set up for sometime in the next week.  She agrees with this treatment plan as well.  Given that her pain seems to be in the groin and with activities I do feel this is warranted at this point to help come up with a diagnosis and treatment plan hopefully this can be diagnostic and therapeutic for her.  I will then see her back myself in about 3 weeks and we can see how she is done from that standpoint.  All question concerns were answered and addressed.  Follow-Up Instructions: Return in about 3 weeks (around 11/13/2018).   Orders:  Orders Placed This Encounter  Procedures   XR HIP UNILAT W OR W/O PELVIS 1V LEFT   No orders of the defined types were placed in this encounter.     Procedures: No procedures performed   Clinical Data: No additional findings.   Subjective: Chief Complaint  Patient presents with   Left Hip - Pain  The patient is very athletic 78 year old female that I saw years ago with left hip pain.  She has had pain again a left hip and hurts mainly with activities of daily living and walking.  It hurts mainly in the groin and not on the side of her hip and is been slowly getting worse with weightbearing.  If she has been sitting or laying down for long period time it stiff when she first gets up.  She has a harder time getting off the floor as well.  She is someone  who appears at least 10 years younger than her stated age and again is very active and does not need to get around with assistive device.  She has no other joint issues or complaints or no other active medical problems.  She is not a diabetic.  HPI  Review of Systems She currently denies any headache, chest pain, shortness of breath, fever, chills, nausea, vomiting  Objective: Vital Signs: Ht 5\' 7"  (1.702 m)    Wt 146 lb (66.2 kg)    BMI 22.87 kg/m   Physical Exam She is alert and orient x3 and in no acute distress Ortho Exam Examination of her left hip shows pain with extremes of internal and external rotation.  There is no blocks to rotation but it is painful.  There is no pain over the trochanteric area or the sciatic area.  There is no pain over the IT band.  Her right hip exam is normal.  She has excellent strength in her bilateral extremities. Specialty Comments:  No specialty comments available.  Imaging: Xr Hip Unilat W Or W/o Pelvis 1v Left  Result Date: 10/23/2018 An AP pelvis and lateral  left hip shows just slight joint space narrowing and slight flattening of the femoral head on the AP view of the left hip but no changes on the lateral view.    PMFS History: There are no active problems to display for this patient.  Past Medical History:  Diagnosis Date   Breast CA ([place])    Personal history of radiation therapy     Family History  Problem Relation Age of Onset   Breast cancer Sister     Past Surgical History:  Procedure Laterality Date   BREAST EXCISIONAL BIOPSY Left    BREAST LUMPECTOMY Left    Social History   Occupational History   Not on file  Tobacco Use   Smoking status: Not on file  Substance and Sexual Activity   Alcohol use: Not on file   Drug use: Not on file   Sexual activity: Not on file

## 2018-10-24 ENCOUNTER — Other Ambulatory Visit (INDEPENDENT_AMBULATORY_CARE_PROVIDER_SITE_OTHER): Payer: Self-pay

## 2018-10-24 DIAGNOSIS — M25552 Pain in left hip: Secondary | ICD-10-CM

## 2018-10-28 ENCOUNTER — Ambulatory Visit (INDEPENDENT_AMBULATORY_CARE_PROVIDER_SITE_OTHER): Payer: Medicare Other | Admitting: Physical Medicine and Rehabilitation

## 2018-10-28 ENCOUNTER — Ambulatory Visit (INDEPENDENT_AMBULATORY_CARE_PROVIDER_SITE_OTHER): Payer: Self-pay

## 2018-10-28 ENCOUNTER — Other Ambulatory Visit: Payer: Self-pay

## 2018-10-28 ENCOUNTER — Encounter (INDEPENDENT_AMBULATORY_CARE_PROVIDER_SITE_OTHER): Payer: Self-pay | Admitting: Physical Medicine and Rehabilitation

## 2018-10-28 VITALS — Temp 98.2°F

## 2018-10-28 DIAGNOSIS — M25552 Pain in left hip: Secondary | ICD-10-CM

## 2018-10-28 NOTE — Progress Notes (Signed)
 .  Numeric Pain Rating Scale and Functional Assessment Average Pain 10   In the last MONTH (on 0-10 scale) has pain interfered with the following?  1. General activity like being  able to carry out your everyday physical activities such as walking, climbing stairs, carrying groceries, or moving a chair?  Rating(8)   -Dye Allergies.  

## 2018-10-28 NOTE — Progress Notes (Signed)
   Rebecca [name] - 78 y.o. female MRN [id number]  Date of birth: 07-21-1941  Office Visit Note: Visit Date: 10/28/2018 PCP: Marton Redwood, MD Referred by: Marton Redwood, MD  Subjective: Chief Complaint  Patient presents with  . Left Hip - Pain   HPI: Rebecca [name] is a 78 y.o. female who comes in today At the request of Dr. Jean Rosenthal for diagnostic and therapeutic anesthetic hip arthrogram on the left.  She reports mostly left posterior pain without really much in the way of groin pain but it is anterior lateral.  She reports the pain started about a month ago.  She has trouble getting out of bed in and out of the car.  She does use Tylenol.  She rates her pain is 10 out of 10.  ROS Otherwise per HPI.  Assessment & Plan: Visit Diagnoses:  1. Pain in left hip     Plan: Findings:  Patient did report relief during the anesthetic phase of the injection.    Meds & Orders: No orders of the defined types were placed in this encounter.   Orders Placed This Encounter  Procedures  . Large Joint Inj: L hip joint  . XR C-ARM NO REPORT    Follow-up: No follow-ups on file.   Procedures: Large Joint Inj: L hip joint on 10/28/2018 3:39 PM Indications: pain and diagnostic evaluation Details: 22 G needle, anterior approach  Arthrogram: Yes  Medications: 80 mg triamcinolone acetonide 40 MG/ML; 3 mL bupivacaine 0.5 % Outcome: tolerated well, no immediate complications  Arthrogram demonstrated excellent flow of contrast throughout the joint surface without extravasation or obvious defect.  The patient had relief of symptoms during the anesthetic phase of the injection.  Procedure, treatment alternatives, risks and benefits explained, specific risks discussed. Consent was given by the patient. Immediately prior to procedure a time out was called to verify the correct patient, procedure, equipment, support staff and site/side marked as required. Patient was prepped and draped in the  usual sterile fashion.      No notes on file   Clinical History: No specialty comments available.   She has no history on file for tobacco. No results for input(s): HGBA1C, LABURIC in the last 8760 hours.  Objective:  VS:  HT:    WT:   BMI:     BP:   HR: bpm  TEMP:98.2 F (36.8 C)(Oral)  RESP:  Physical Exam  Ortho Exam Imaging: Xr C-arm No Report  Result Date: 10/28/2018 Please see Notes tab for imaging impression.   Past Medical/Family/Surgical/Social History: Medications & Allergies reviewed per EMR, new medications updated. There are no active problems to display for this patient.  Past Medical History:  Diagnosis Date  . Breast CA ([place])   . Personal history of radiation therapy    Family History  Problem Relation Age of Onset  . Breast cancer Sister    Past Surgical History:  Procedure Laterality Date  . BREAST EXCISIONAL BIOPSY Left   . BREAST LUMPECTOMY Left    Social History   Occupational History  . Not on file  Tobacco Use  . Smoking status: Not on file  Substance and Sexual Activity  . Alcohol use: Not on file  . Drug use: Not on file  . Sexual activity: Not on file

## 2018-10-29 MED ORDER — TRIAMCINOLONE ACETONIDE 40 MG/ML IJ SUSP
80.0000 mg | INTRAMUSCULAR | Status: AC | PRN
  Administered 2018-10-28: 80 mg via INTRA_ARTICULAR

## 2018-10-29 MED ORDER — BUPIVACAINE HCL 0.5 % IJ SOLN
3.0000 mL | INTRAMUSCULAR | Status: AC | PRN
  Administered 2018-10-28: 3 mL via INTRA_ARTICULAR

## 2018-11-13 ENCOUNTER — Ambulatory Visit (INDEPENDENT_AMBULATORY_CARE_PROVIDER_SITE_OTHER): Payer: Medicare Other | Admitting: Orthopaedic Surgery

## 2019-01-19 ENCOUNTER — Encounter: Payer: Self-pay | Admitting: Physician Assistant

## 2019-01-19 ENCOUNTER — Other Ambulatory Visit: Payer: Self-pay

## 2019-01-19 ENCOUNTER — Ambulatory Visit (INDEPENDENT_AMBULATORY_CARE_PROVIDER_SITE_OTHER): Payer: Medicare Other | Admitting: Physician Assistant

## 2019-01-19 DIAGNOSIS — M1612 Unilateral primary osteoarthritis, left hip: Secondary | ICD-10-CM | POA: Diagnosis not present

## 2019-01-19 NOTE — Progress Notes (Signed)
   Office Visit Note   Patient: Rebecca [name]           Date of Birth: [birth year]           MRN: [id number] Visit Date: 01/19/2019              Requested by: Marton Redwood, MD [address] [place], [place] [address] PCP: Marton Redwood, MD   Assessment & Plan: Visit Diagnoses:  1. Unilateral primary osteoarthritis, left hip     Plan: Explained to patient that she should wait at least 6 months between injections in the left hip.  This point time would recommend MRI to evaluate the hip cartilage.  She will undergo the MRI and then follow-up after the study to go over the results and discuss further treatment.  Questions encouraged and answered.  Follow-Up Instructions: Return After MRI.   Orders:  No orders of the defined types were placed in this encounter.  No orders of the defined types were placed in this encounter.     Procedures: No procedures performed   Clinical Data: No additional findings.   Subjective: Chief Complaint  Patient presents with  . Left Hip - Pain    HPI [name] is an 78 year old female comes in today due to left hip pain.  She was seen last in March by Dr. Beverly Sessions at that time was sent for an intra-articular injection with Dr. Ernestina Patches on 10/28/2018.  She states the intra-articular injection of the left hip lasted about 2-1/2 to 3 weeks she had great relief but her pain slowly came back.  She is requesting another injection in the hip today.  She states she cannot sleep on the left hip.  Pain is worse at night.  She is taking Tylenol arthritis.  She is sleeping during the day due to the fact that she cannot sleep at night. Radiographs back in March showed slight narrowing of the left hip joint with slight flattening the femoral head. Review of Systems See HPI otherwise negative.  Objective: Vital Signs: There were no vitals taken for this visit.  Physical Exam Physical exam General well-developed well-nourished female no acute distress mood  affect appropriate.  Psych alert and oriented x3.  Ortho Exam Left hip: Diminished internal and external rotation with pain with internal rotation of the left hip.  Patient ambulates without any assistive device and a nonantalgic gait. Specialty Comments:  No specialty comments available.  Imaging: No results found.   PMFS History: Patient Active Problem List   Diagnosis Date Noted  . Unilateral primary osteoarthritis, left hip 01/19/2019   Past Medical History:  Diagnosis Date  . Breast CA ([place])   . Personal history of radiation therapy     Family History  Problem Relation Age of Onset  . Breast cancer Sister     Past Surgical History:  Procedure Laterality Date  . BREAST EXCISIONAL BIOPSY Left   . BREAST LUMPECTOMY Left    Social History   Occupational History  . Not on file  Tobacco Use  . Smoking status: Never Smoker  . Smokeless tobacco: Never Used  Substance and Sexual Activity  . Alcohol use: Not on file  . Drug use: Not on file  . Sexual activity: Not on file

## 2019-01-19 NOTE — Addendum Note (Signed)
Addended by: Meyer Cory on: 01/19/2019 11:15 AM   Modules accepted: Orders

## 2019-02-04 ENCOUNTER — Ambulatory Visit
Admission: RE | Admit: 2019-02-04 | Discharge: 2019-02-04 | Disposition: A | Discharge status: Home / Self Care | Payer: Medicare Other | Source: Ambulatory Visit | Attending: Physician Assistant | Admitting: Physician Assistant

## 2019-02-04 ENCOUNTER — Other Ambulatory Visit: Payer: Self-pay

## 2019-02-04 DIAGNOSIS — M1612 Unilateral primary osteoarthritis, left hip: Secondary | ICD-10-CM

## 2019-02-09 ENCOUNTER — Ambulatory Visit: Payer: Medicare Other | Admitting: Orthopaedic Surgery

## 2019-02-10 ENCOUNTER — Other Ambulatory Visit: Payer: Self-pay

## 2019-02-10 ENCOUNTER — Ambulatory Visit (INDEPENDENT_AMBULATORY_CARE_PROVIDER_SITE_OTHER): Payer: Medicare Other | Admitting: Orthopaedic Surgery

## 2019-02-10 ENCOUNTER — Encounter: Payer: Self-pay | Admitting: Orthopaedic Surgery

## 2019-02-10 DIAGNOSIS — M1612 Unilateral primary osteoarthritis, left hip: Secondary | ICD-10-CM

## 2019-02-10 NOTE — Progress Notes (Signed)
Office Visit Note   Patient: Rebecca [name]           Date of Birth: [birth year]           MRN: [id number] Visit Date: 02/10/2019              Requested by: Marton Redwood, MD [address] [place],  [place] [address] PCP: Marton Redwood, MD   Assessment & Plan: Visit Diagnoses:  1. Unilateral primary osteoarthritis, left hip     Plan: At this point given her daily pain and her MRI findings combined with failure of conservative treatment we are recommending hip replacement surgery and she does wish to proceed with this.  We had a long and thorough discussion about hip replacement surgery.  We talked about the risk and benefits of surgery.  I talked about her interoperative and postoperative course and gave her handout about this.  All question concerns were answered addressed.  She would like to work on getting this scheduled.  Follow-Up Instructions: Return for 2 weeks post-op.   Orders:  No orders of the defined types were placed in this encounter.  No orders of the defined types were placed in this encounter.     Procedures: No procedures performed   Clinical Data: No additional findings.   Subjective: Chief Complaint  Patient presents with  . Left Hip - Follow-up  The patient is a very pleasant 78 year old female who comes in for follow-up after having an MRI of her left hip.  Her x-rays showed mild arthritic changes but due to continued severe left hip pain we obtained an MRI.  This was also after the failure of conservative treatment included an intra-articular steroid injection as well as anti-inflammatories, activity modification, use of assistive device, and strengthening exercises of the hip.  Her pain is still in the groin is still daily.  The steroid injection lasted for about 2 weeks.  At this point her left hip pain is been detriment affecting her actives the living, her quality of life and her mobility.  HPI  Review of Systems She currently denies any  headache, chest pain, shortness of breath, fever, chills, nausea, vomiting  Objective: Vital Signs: There were no vitals taken for this visit.  Physical Exam She is alert and orient x3 and in no acute distress Ortho Exam Examination of her left hip shows pain with internal and external rotation and so in the groin. Specialty Comments:  No specialty comments available.  Imaging: No results found.  The MRI is independently reviewed and shared with her of her left hip.  It does show quite significant arthritis of the left hip with cartilage loss of the weightbearing surface of the hip as well as degenerative labral tearing and reactive changes in the bone with edema. PMFS History: Patient Active Problem List   Diagnosis Date Noted  . Unilateral primary osteoarthritis, left hip 01/19/2019   Past Medical History:  Diagnosis Date  . Breast CA ([place])   . Personal history of radiation therapy     Family History  Problem Relation Age of Onset  . Breast cancer Sister     Past Surgical History:  Procedure Laterality Date  . BREAST EXCISIONAL BIOPSY Left   . BREAST LUMPECTOMY Left    Social History   Occupational History  . Not on file  Tobacco Use  . Smoking status: Never Smoker  . Smokeless tobacco: Never Used  Substance and Sexual Activity  . Alcohol use: Not on file  .  Drug use: Not on file  . Sexual activity: Not on file

## 2019-02-20 ENCOUNTER — Other Ambulatory Visit: Payer: Self-pay

## 2019-02-23 ENCOUNTER — Other Ambulatory Visit: Payer: Self-pay | Admitting: Physician Assistant

## 2019-03-03 ENCOUNTER — Other Ambulatory Visit (HOSPITAL_COMMUNITY)
Admission: RE | Admit: 2019-03-03 | Discharge: 2019-03-03 | Disposition: A | Discharge status: Home / Self Care | Payer: Medicare Other | Source: Ambulatory Visit | Attending: Orthopaedic Surgery | Admitting: Orthopaedic Surgery

## 2019-03-03 DIAGNOSIS — Z1159 Encounter for screening for other viral diseases: Secondary | ICD-10-CM | POA: Diagnosis present

## 2019-03-03 LAB — SARS CORONAVIRUS 2 (TAT 6-24 HRS): SARS Coronavirus 2: NEGATIVE

## 2019-03-04 ENCOUNTER — Encounter (HOSPITAL_COMMUNITY): Payer: Self-pay

## 2019-03-04 NOTE — Patient Instructions (Addendum)
DUE TO COVID-19 ONLY ONE VISITOR IS ALLOWED IN THE HOSPITAL AT THIS TIME   COVID SWAB TESTING COMPLETED ON: March 03, 2019 (Must self quarantine after testing. Follow instructions on handout.)   Your procedure is scheduled on: Friday, March 06, 2019   Report to [facility] Main  Entrance   Report to Short Stay at 5:30 AM   Call this number if you have problems the morning of surgery [phone]   Do not eat food:After Midnight.   May have liquids until 4:15 AM day of sugery   CLEAR LIQUID DIET  Foods Allowed                                                                     Foods Excluded  Water, Black Coffee and tea, regular and decaf                             liquids that you cannot  Plain Jell-O in any flavor  (No red)                                           see through such as: Fruit ices (not with fruit pulp)                                     milk, soups, orange juice  Iced Popsicles (No red)                                    All solid food Carbonated beverages, regular and diet                                    Apple juices Sports drinks like Gatorade (No red) Lightly seasoned clear broth or consume(fat free) Sugar, honey syrup  Sample Menu Breakfast                                Lunch                                     Supper Cranberry juice                    Beef broth                            Chicken broth Jell-O                                     Grape juice  Apple juice Coffee or tea                        Jell-O                                      Popsicle                                                Coffee or tea                        Coffee or tea   Complete one Ensure drink the morning of surgery at 4:15AM the day of surgery.   Brush your teeth the morning of surgery.   Do NOT smoke after Midnight   Take these medicines the morning of surgery with A SIP OF WATER: Levothyroxine                       You may not have any metal on your body including hair pins, jewelry, and body piercings             Do not wear make-up, lotions, powders, perfumes/cologne, or deodorant             Do not wear nail polish.  Do not shave  48 hours prior to surgery.                Do not bring valuables to the hospital. [place].   Contacts, dentures or bridgework may not be worn into surgery.   Bring small overnight bag day of surgery.    Special Instructions: Bring a copy of your healthcare power of attorney and living will documents         the day of surgery if you haven't scanned them in before.              Please read over the following fact sheets you were given:  [facility] - Preparing for Surgery Before surgery, you can play an important role.  Because skin is not sterile, your skin needs to be as free of germs as possible.  You can reduce the number of germs on your skin by washing with CHG (chlorahexidine gluconate) soap before surgery.  CHG is an antiseptic cleaner which kills germs and bonds with the skin to continue killing germs even after washing. Please DO NOT use if you have an allergy to CHG or antibacterial soaps.  If your skin becomes reddened/irritated stop using the CHG and inform your nurse when you arrive at Short Stay. Do not shave (including legs and underarms) for at least 48 hours prior to the first CHG shower.  You may shave your face/neck.  Please follow these instructions carefully:  1.  Shower with CHG Soap the night before surgery and the  morning of surgery.  2.  If you choose to wash your hair, wash your hair first as usual with your normal  shampoo.  3.  After you shampoo, rinse your hair and body thoroughly to remove the shampoo.  4.  Use CHG as you would any other liquid soap.  You can apply chg directly to the skin and wash.  Gently with a scrungie or clean washcloth.  5.  Apply the CHG  Soap to your body ONLY FROM THE NECK DOWN.   Do   not use on face/ open                           Wound or open sores. Avoid contact with eyes, ears mouth and   genitals (private parts).                       Wash face,  Genitals (private parts) with your normal soap.             6.  Wash thoroughly, paying special attention to the area where your    surgery  will be performed.  7.  Thoroughly rinse your body with warm water from the neck down.  8.  DO NOT shower/wash with your normal soap after using and rinsing off the CHG Soap.                9.  Pat yourself dry with a clean towel.            10.  Wear clean pajamas.            11.  Place clean sheets on your bed the night of your first shower and do not  sleep with pets. Day of Surgery : Do not apply any lotions/deodorants the morning of surgery.  Please wear clean clothes to the hospital/surgery center.  FAILURE TO FOLLOW THESE INSTRUCTIONS MAY RESULT IN THE CANCELLATION OF YOUR SURGERY  PATIENT SIGNATURE_________________________________  NURSE SIGNATURE__________________________________  ________________________________________________________________________   Rebecca [name]  An incentive spirometer is a tool that can help keep your lungs clear and active. This tool measures how well you are filling your lungs with each breath. Taking long deep breaths may help reverse or decrease the chance of developing breathing (pulmonary) problems (especially infection) following:  A long period of time when you are unable to move or be active. BEFORE THE PROCEDURE   If the spirometer includes an indicator to show your best effort, your nurse or respiratory therapist will set it to a desired goal.  If possible, sit up straight or lean slightly forward. Try not to slouch.  Hold the incentive spirometer in an upright position. INSTRUCTIONS FOR USE  1. Sit on the edge of your bed if possible, or sit up as far as you can in bed or on  a chair. 2. Hold the incentive spirometer in an upright position. 3. Breathe out normally. 4. Place the mouthpiece in your mouth and seal your lips tightly around it. 5. Breathe in slowly and as deeply as possible, raising the piston or the ball toward the top of the column. 6. Hold your breath for 3-5 seconds or for as long as possible. Allow the piston or ball to fall to the bottom of the column. 7. Remove the mouthpiece from your mouth and breathe out normally. 8. Rest for a few seconds and repeat Steps 1 through 7 at least 10 times every 1-2 hours when you are awake. Take your time and take a few normal breaths between deep breaths. 9. The spirometer may include an indicator to show your best effort. Use the indicator as a goal to work toward during each  repetition. 10. After each set of 10 deep breaths, practice coughing to be sure your lungs are clear. If you have an incision (the cut made at the time of surgery), support your incision when coughing by placing a pillow or rolled up towels firmly against it. Once you are able to get out of bed, walk around indoors and cough well. You may stop using the incentive spirometer when instructed by your caregiver.  RISKS AND COMPLICATIONS  Take your time so you do not get dizzy or light-headed.  If you are in pain, you may need to take or ask for pain medication before doing incentive spirometry. It is harder to take a deep breath if you are having pain. AFTER USE  Rest and breathe slowly and easily.  It can be helpful to keep track of a log of your progress. Your caregiver can provide you with a simple table to help with this. If you are using the spirometer at home, follow these instructions: [place] IF:   You are having difficultly using the spirometer.  You have trouble using the spirometer as often as instructed.  Your pain medication is not giving enough relief while using the spirometer.  You develop fever of 100.5 F  (38.1 C) or higher. SEEK IMMEDIATE MEDICAL CARE IF:   You cough up bloody sputum that had not been present before.  You develop fever of 102 F (38.9 C) or greater.  You develop worsening pain at or near the incision site. MAKE SURE YOU:   Understand these instructions.  Will watch your condition.  Will get help right away if you are not doing well or get worse. Document Released: 12/10/2006 Document Revised: 10/22/2011 Document Reviewed: 02/10/2007 [facility] Patient Information 2014 [place], [place].   ________________________________________________________________________

## 2019-03-05 ENCOUNTER — Encounter (HOSPITAL_COMMUNITY): Payer: Self-pay

## 2019-03-05 ENCOUNTER — Encounter (HOSPITAL_COMMUNITY)
Admission: RE | Admit: 2019-03-05 | Discharge: 2019-03-05 | Disposition: A | Discharge status: Home / Self Care | Payer: Medicare Other | Source: Ambulatory Visit | Attending: Orthopaedic Surgery | Admitting: Orthopaedic Surgery

## 2019-03-05 ENCOUNTER — Other Ambulatory Visit: Payer: Self-pay

## 2019-03-05 DIAGNOSIS — Z7982 Long term (current) use of aspirin: Secondary | ICD-10-CM | POA: Diagnosis not present

## 2019-03-05 DIAGNOSIS — M1612 Unilateral primary osteoarthritis, left hip: Secondary | ICD-10-CM | POA: Insufficient documentation

## 2019-03-05 DIAGNOSIS — K219 Gastro-esophageal reflux disease without esophagitis: Secondary | ICD-10-CM | POA: Insufficient documentation

## 2019-03-05 DIAGNOSIS — Z79899 Other long term (current) drug therapy: Secondary | ICD-10-CM | POA: Insufficient documentation

## 2019-03-05 DIAGNOSIS — D649 Anemia, unspecified: Secondary | ICD-10-CM | POA: Insufficient documentation

## 2019-03-05 DIAGNOSIS — Z01812 Encounter for preprocedural laboratory examination: Secondary | ICD-10-CM | POA: Insufficient documentation

## 2019-03-05 DIAGNOSIS — E039 Hypothyroidism, unspecified: Secondary | ICD-10-CM | POA: Insufficient documentation

## 2019-03-05 DIAGNOSIS — E559 Vitamin D deficiency, unspecified: Secondary | ICD-10-CM | POA: Diagnosis not present

## 2019-03-05 DIAGNOSIS — Z853 Personal history of malignant neoplasm of breast: Secondary | ICD-10-CM | POA: Insufficient documentation

## 2019-03-05 DIAGNOSIS — K449 Diaphragmatic hernia without obstruction or gangrene: Secondary | ICD-10-CM | POA: Diagnosis not present

## 2019-03-05 DIAGNOSIS — Z923 Personal history of irradiation: Secondary | ICD-10-CM | POA: Insufficient documentation

## 2019-03-05 HISTORY — DX: Gastro-esophageal reflux disease without esophagitis: K21.9

## 2019-03-05 HISTORY — DX: Hypothyroidism, unspecified: E03.9

## 2019-03-05 HISTORY — DX: Personal history of other diseases of the nervous system and sense organs: Z86.69

## 2019-03-05 HISTORY — DX: Nausea with vomiting, unspecified: R11.2

## 2019-03-05 HISTORY — DX: Unspecified osteoarthritis, unspecified site: M19.90

## 2019-03-05 HISTORY — DX: Vitamin D deficiency, unspecified: E55.9

## 2019-03-05 HISTORY — DX: Personal history of other diseases of the digestive system: Z87.19

## 2019-03-05 HISTORY — DX: Other specified postprocedural states: Z98.890

## 2019-03-05 HISTORY — DX: Anemia, unspecified: D64.9

## 2019-03-05 LAB — CBC
HCT: 41.8 % (ref 36.0–46.0)
Hemoglobin: 12.9 g/dL (ref 12.0–15.0)
MCH: 28.9 pg (ref 26.0–34.0)
MCHC: 30.9 g/dL (ref 30.0–36.0)
MCV: 93.5 fL (ref 80.0–100.0)
Platelets: 308 10*3/uL (ref 150–400)
RBC: 4.47 MIL/uL (ref 3.87–5.11)
RDW: 12.9 % (ref 11.5–15.5)
WBC: 5.6 10*3/uL (ref 4.0–10.5)
nRBC: 0 % (ref 0.0–0.2)

## 2019-03-05 LAB — SURGICAL PCR SCREEN
MRSA, PCR: NEGATIVE
Staphylococcus aureus: NEGATIVE

## 2019-03-05 NOTE — Anesthesia Preprocedure Evaluation (Addendum)
Anesthesia Evaluation  Patient identified by MRN, date of birth, ID band Patient awake    Reviewed: Allergy & Precautions, H&P , NPO status , Patient's Chart, lab work & pertinent test results  History of Anesthesia Complications (+) PONV  Airway Mallampati: II  TM Distance: >3 FB Neck ROM: Full    Dental no notable dental hx. (+) Teeth Intact, Dental Advisory Given   Pulmonary neg pulmonary ROS,    Pulmonary exam normal breath sounds clear to auscultation       Cardiovascular Exercise Tolerance: Good negative cardio ROS   Rhythm:Regular Rate:Normal     Neuro/Psych negative neurological ROS  negative psych ROS   GI/Hepatic Neg liver ROS, hiatal hernia, GERD  Medicated and Controlled,  Endo/Other  Hypothyroidism   Renal/GU negative Renal ROS  negative genitourinary   Musculoskeletal  (+) Arthritis , Osteoarthritis,    Abdominal   Peds  Hematology  (+) Blood dyscrasia, anemia ,   Anesthesia Other Findings   Reproductive/Obstetrics negative OB ROS                            Anesthesia Physical Anesthesia Plan  ASA: II  Anesthesia Plan: Spinal   Post-op Pain Management:    Induction: Intravenous  PONV Risk Score and Plan: 4 or greater and Propofol infusion, Ondansetron, Dexamethasone and TIVA  Airway Management Planned: Simple Face Mask  Additional Equipment:   Intra-op Plan:   Post-operative Plan:   Informed Consent: I have reviewed the patients History and Physical, chart, labs and discussed the procedure including the risks, benefits and alternatives for the proposed anesthesia with the patient or authorized representative who has indicated his/her understanding and acceptance.     Dental advisory given  Plan Discussed with: CRNA  Anesthesia Plan Comments:         Anesthesia Quick Evaluation

## 2019-03-05 NOTE — Progress Notes (Signed)
SPOKE W/  Rebecca [name]     SCREENING SYMPTOMS OF COVID 19:   COUGH--NO  RUNNY NOSE--- NO  SORE THROAT---NO  NASAL CONGESTION----NO  SNEEZING----NO  SHORTNESS OF BREATH---NO  DIFFICULTY BREATHING---NO  TEMP >100.0 -----NO  UNEXPLAINED BODY ACHES------NO  CHILLS -------- NO  HEADACHES ---------NO  LOSS OF SMELL/ TASTE --------NO    HAVE YOU OR ANY FAMILY MEMBER TRAVELLED PAST 14 DAYS OUT OF THE   COUNTY---NO STATE----NO COUNTRY----NO  HAVE YOU OR ANY FAMILY MEMBER BEEN EXPOSED TO ANYONE WITH COVID 19? NO

## 2019-03-06 ENCOUNTER — Other Ambulatory Visit: Payer: Self-pay

## 2019-03-06 ENCOUNTER — Encounter (HOSPITAL_COMMUNITY): Payer: Self-pay

## 2019-03-06 ENCOUNTER — Observation Stay (HOSPITAL_COMMUNITY)
Admission: RE | Admit: 2019-03-06 | Discharge: 2019-03-08 | Disposition: A | Discharge status: Home Health | Payer: Medicare Other | Attending: Orthopaedic Surgery | Admitting: Orthopaedic Surgery

## 2019-03-06 ENCOUNTER — Observation Stay (HOSPITAL_COMMUNITY): Payer: Medicare Other

## 2019-03-06 ENCOUNTER — Ambulatory Visit (HOSPITAL_COMMUNITY): Payer: Medicare Other | Admitting: Anesthesiology

## 2019-03-06 ENCOUNTER — Encounter (HOSPITAL_COMMUNITY)
Admission: RE | Disposition: A | Discharge status: Home Health | Payer: Self-pay | Source: Home / Self Care | Attending: Orthopaedic Surgery

## 2019-03-06 ENCOUNTER — Ambulatory Visit (HOSPITAL_COMMUNITY): Payer: Medicare Other | Admitting: Physician Assistant

## 2019-03-06 ENCOUNTER — Ambulatory Visit (HOSPITAL_COMMUNITY): Payer: Medicare Other

## 2019-03-06 DIAGNOSIS — K219 Gastro-esophageal reflux disease without esophagitis: Secondary | ICD-10-CM | POA: Diagnosis not present

## 2019-03-06 DIAGNOSIS — K449 Diaphragmatic hernia without obstruction or gangrene: Secondary | ICD-10-CM | POA: Diagnosis not present

## 2019-03-06 DIAGNOSIS — Z96642 Presence of left artificial hip joint: Secondary | ICD-10-CM

## 2019-03-06 DIAGNOSIS — E039 Hypothyroidism, unspecified: Secondary | ICD-10-CM | POA: Insufficient documentation

## 2019-03-06 DIAGNOSIS — Z853 Personal history of malignant neoplasm of breast: Secondary | ICD-10-CM | POA: Diagnosis not present

## 2019-03-06 DIAGNOSIS — Z419 Encounter for procedure for purposes other than remedying health state, unspecified: Secondary | ICD-10-CM

## 2019-03-06 DIAGNOSIS — M1612 Unilateral primary osteoarthritis, left hip: Secondary | ICD-10-CM | POA: Diagnosis not present

## 2019-03-06 DIAGNOSIS — E559 Vitamin D deficiency, unspecified: Secondary | ICD-10-CM | POA: Insufficient documentation

## 2019-03-06 DIAGNOSIS — Z79899 Other long term (current) drug therapy: Secondary | ICD-10-CM | POA: Insufficient documentation

## 2019-03-06 DIAGNOSIS — Z7982 Long term (current) use of aspirin: Secondary | ICD-10-CM | POA: Insufficient documentation

## 2019-03-06 DIAGNOSIS — D649 Anemia, unspecified: Secondary | ICD-10-CM | POA: Insufficient documentation

## 2019-03-06 HISTORY — PX: TOTAL HIP ARTHROPLASTY: SHX124

## 2019-03-06 SURGERY — ARTHROPLASTY, HIP, TOTAL, ANTERIOR APPROACH
Anesthesia: Spinal | Laterality: Left

## 2019-03-06 MED ORDER — SODIUM CHLORIDE 0.9 % IV SOLN
INTRAVENOUS | Status: DC | PRN
  Administered 2019-03-06: 08:00:00 25 ug/min via INTRAVENOUS

## 2019-03-06 MED ORDER — POLYSACCHARIDE IRON COMPLEX 150 MG PO CAPS
150.0000 mg | ORAL_CAPSULE | Freq: Every day | ORAL | Status: DC
  Administered 2019-03-06 – 2019-03-08 (×3): 150 mg via ORAL
  Filled 2019-03-06 (×3): qty 1

## 2019-03-06 MED ORDER — DIPHENHYDRAMINE HCL 12.5 MG/5ML PO ELIX: 12.5000 mg | ORAL_SOLUTION | ORAL | Status: DC | PRN

## 2019-03-06 MED ORDER — METOCLOPRAMIDE HCL 5 MG PO TABS: 5.0000 mg | ORAL_TABLET | Freq: Three times a day (TID) | ORAL | Status: DC | PRN

## 2019-03-06 MED ORDER — MENTHOL 3 MG MT LOZG: 1.0000 | LOZENGE | OROMUCOSAL | Status: DC | PRN

## 2019-03-06 MED ORDER — ONDANSETRON HCL 4 MG/2ML IJ SOLN: 4.0000 mg | Freq: Four times a day (QID) | INTRAMUSCULAR | Status: DC | PRN

## 2019-03-06 MED ORDER — FENTANYL CITRATE (PF) 100 MCG/2ML IJ SOLN: 25.0000 ug | INTRAMUSCULAR | Status: DC | PRN

## 2019-03-06 MED ORDER — PHENOL 1.4 % MT LIQD: 1.0000 | OROMUCOSAL | Status: DC | PRN

## 2019-03-06 MED ORDER — DEXAMETHASONE SODIUM PHOSPHATE 10 MG/ML IJ SOLN
INTRAMUSCULAR | Status: AC
  Filled 2019-03-06: qty 1

## 2019-03-06 MED ORDER — VITAMIN D 25 MCG (1000 UNIT) PO TABS
1000.0000 [IU] | ORAL_TABLET | Freq: Every day | ORAL | Status: DC
  Administered 2019-03-06 – 2019-03-08 (×3): 1000 [IU] via ORAL
  Filled 2019-03-06 (×3): qty 1

## 2019-03-06 MED ORDER — TRANEXAMIC ACID-NACL 1000-0.7 MG/100ML-% IV SOLN
1000.0000 mg | INTRAVENOUS | Status: AC
  Administered 2019-03-06: 1000 mg via INTRAVENOUS
  Filled 2019-03-06: qty 100

## 2019-03-06 MED ORDER — LACTATED RINGERS IV SOLN
INTRAVENOUS | Status: DC
  Administered 2019-03-06 (×2): via INTRAVENOUS
  Administered 2019-03-06: 1000 mL via INTRAVENOUS

## 2019-03-06 MED ORDER — CHLORHEXIDINE GLUCONATE 4 % EX LIQD: 60.0000 mL | Freq: Once | CUTANEOUS | Status: DC

## 2019-03-06 MED ORDER — PANTOPRAZOLE SODIUM 40 MG PO TBEC
40.0000 mg | DELAYED_RELEASE_TABLET | Freq: Every day | ORAL | Status: DC
  Administered 2019-03-06 – 2019-03-08 (×3): 40 mg via ORAL
  Filled 2019-03-06 (×3): qty 1

## 2019-03-06 MED ORDER — SODIUM CHLORIDE 0.9 % IV SOLN
INTRAVENOUS | Status: DC
  Administered 2019-03-06 – 2019-03-07 (×2): via INTRAVENOUS

## 2019-03-06 MED ORDER — METOCLOPRAMIDE HCL 5 MG/ML IJ SOLN: 5.0000 mg | Freq: Three times a day (TID) | INTRAMUSCULAR | Status: DC | PRN

## 2019-03-06 MED ORDER — ONDANSETRON HCL 4 MG/2ML IJ SOLN
INTRAMUSCULAR | Status: DC | PRN
  Administered 2019-03-06: 4 mg via INTRAVENOUS

## 2019-03-06 MED ORDER — POLYETHYLENE GLYCOL 3350 17 G PO PACK
17.0000 g | PACK | Freq: Every day | ORAL | Status: DC | PRN
  Administered 2019-03-08: 17 g via ORAL
  Filled 2019-03-06: qty 1

## 2019-03-06 MED ORDER — PROPOFOL 10 MG/ML IV BOLUS
INTRAVENOUS | Status: AC
  Filled 2019-03-06: qty 60

## 2019-03-06 MED ORDER — ACETAMINOPHEN 500 MG PO TABS
1000.0000 mg | ORAL_TABLET | Freq: Once | ORAL | Status: AC
  Administered 2019-03-06: 1000 mg via ORAL
  Filled 2019-03-06: qty 2

## 2019-03-06 MED ORDER — ASPIRIN 81 MG PO CHEW
81.0000 mg | CHEWABLE_TABLET | Freq: Two times a day (BID) | ORAL | Status: DC
  Administered 2019-03-06 – 2019-03-08 (×4): 81 mg via ORAL
  Filled 2019-03-06 (×4): qty 1

## 2019-03-06 MED ORDER — PROPOFOL 500 MG/50ML IV EMUL
INTRAVENOUS | Status: DC | PRN
  Administered 2019-03-06: 75 ug/kg/min via INTRAVENOUS

## 2019-03-06 MED ORDER — DOCUSATE SODIUM 100 MG PO CAPS
100.0000 mg | ORAL_CAPSULE | Freq: Two times a day (BID) | ORAL | Status: DC
  Administered 2019-03-06 – 2019-03-08 (×5): 100 mg via ORAL
  Filled 2019-03-06 (×5): qty 1

## 2019-03-06 MED ORDER — METHOCARBAMOL 500 MG IVPB - SIMPLE MED
500.0000 mg | Freq: Four times a day (QID) | INTRAVENOUS | Status: DC | PRN
  Administered 2019-03-06: 500 mg via INTRAVENOUS
  Filled 2019-03-06: qty 50

## 2019-03-06 MED ORDER — METHOCARBAMOL 500 MG IVPB - SIMPLE MED
INTRAVENOUS | Status: AC
  Filled 2019-03-06: qty 50

## 2019-03-06 MED ORDER — MIDAZOLAM HCL 2 MG/2ML IJ SOLN
INTRAMUSCULAR | Status: AC
  Filled 2019-03-06: qty 2

## 2019-03-06 MED ORDER — METHOCARBAMOL 500 MG PO TABS
500.0000 mg | ORAL_TABLET | Freq: Four times a day (QID) | ORAL | Status: DC | PRN
  Administered 2019-03-06 – 2019-03-08 (×4): 500 mg via ORAL
  Filled 2019-03-06 (×4): qty 1

## 2019-03-06 MED ORDER — HYDROCODONE-ACETAMINOPHEN 5-325 MG PO TABS
1.0000 | ORAL_TABLET | ORAL | Status: DC | PRN
  Administered 2019-03-06: 2 via ORAL
  Administered 2019-03-07: 1 via ORAL
  Administered 2019-03-07 (×2): 2 via ORAL
  Administered 2019-03-08: 1 via ORAL
  Filled 2019-03-06 (×2): qty 1
  Filled 2019-03-06 (×4): qty 2

## 2019-03-06 MED ORDER — TRAMADOL HCL 50 MG PO TABS
50.0000 mg | ORAL_TABLET | Freq: Four times a day (QID) | ORAL | Status: DC
  Administered 2019-03-06 – 2019-03-08 (×6): 50 mg via ORAL
  Filled 2019-03-06 (×6): qty 1

## 2019-03-06 MED ORDER — DEXAMETHASONE SODIUM PHOSPHATE 10 MG/ML IJ SOLN
INTRAMUSCULAR | Status: DC | PRN
  Administered 2019-03-06: 6 mg via INTRAVENOUS

## 2019-03-06 MED ORDER — ONDANSETRON HCL 4 MG PO TABS: 4.0000 mg | ORAL_TABLET | Freq: Four times a day (QID) | ORAL | Status: DC | PRN

## 2019-03-06 MED ORDER — ONDANSETRON HCL 4 MG/2ML IJ SOLN
INTRAMUSCULAR | Status: AC
  Filled 2019-03-06: qty 2

## 2019-03-06 MED ORDER — FENTANYL CITRATE (PF) 100 MCG/2ML IJ SOLN
INTRAMUSCULAR | Status: DC | PRN
  Administered 2019-03-06: 75 ug via INTRAVENOUS

## 2019-03-06 MED ORDER — SODIUM CHLORIDE 0.9 % IR SOLN
Status: DC | PRN
  Administered 2019-03-06 (×2): 1000 mL

## 2019-03-06 MED ORDER — HYDROCODONE-ACETAMINOPHEN 7.5-325 MG PO TABS: 1.0000 | ORAL_TABLET | ORAL | Status: DC | PRN

## 2019-03-06 MED ORDER — ACETAMINOPHEN 325 MG PO TABS
325.0000 mg | ORAL_TABLET | Freq: Four times a day (QID) | ORAL | Status: DC | PRN
  Administered 2019-03-08: 08:00:00 650 mg via ORAL
  Filled 2019-03-06: qty 2

## 2019-03-06 MED ORDER — MORPHINE SULFATE (PF) 2 MG/ML IV SOLN
0.5000 mg | INTRAVENOUS | Status: DC | PRN
  Administered 2019-03-06: 1 mg via INTRAVENOUS
  Filled 2019-03-06: qty 1

## 2019-03-06 MED ORDER — ALUM & MAG HYDROXIDE-SIMETH 200-200-20 MG/5ML PO SUSP: 30.0000 mL | ORAL | Status: DC | PRN

## 2019-03-06 MED ORDER — FENTANYL CITRATE (PF) 100 MCG/2ML IJ SOLN
INTRAMUSCULAR | Status: AC
  Filled 2019-03-06: qty 2

## 2019-03-06 MED ORDER — LEVOTHYROXINE SODIUM 75 MCG PO TABS
75.0000 ug | ORAL_TABLET | Freq: Every day | ORAL | Status: DC
  Administered 2019-03-07 – 2019-03-08 (×2): 75 ug via ORAL
  Filled 2019-03-06 (×2): qty 1

## 2019-03-06 MED ORDER — POVIDONE-IODINE 10 % EX SWAB: 2.0000 "application " | Freq: Once | CUTANEOUS | Status: DC

## 2019-03-06 MED ORDER — CEFAZOLIN SODIUM-DEXTROSE 2-4 GM/100ML-% IV SOLN
2.0000 g | INTRAVENOUS | Status: AC
  Administered 2019-03-06: 2 g via INTRAVENOUS
  Filled 2019-03-06: qty 100

## 2019-03-06 MED ORDER — BUPIVACAINE IN DEXTROSE 0.75-8.25 % IT SOLN
INTRATHECAL | Status: DC | PRN
  Administered 2019-03-06: 1.6 mL via INTRATHECAL

## 2019-03-06 MED ORDER — CEFAZOLIN SODIUM-DEXTROSE 1-4 GM/50ML-% IV SOLN
1.0000 g | Freq: Four times a day (QID) | INTRAVENOUS | Status: AC
  Administered 2019-03-06 (×2): 1 g via INTRAVENOUS
  Filled 2019-03-06 (×2): qty 50

## 2019-03-06 SURGICAL SUPPLY — 41 items
BAG ZIPLOCK 12X15 (MISCELLANEOUS) IMPLANT
BENZOIN TINCTURE PRP APPL 2/3 (GAUZE/BANDAGES/DRESSINGS) ×1 IMPLANT
BLADE SAW SGTL 18X1.27X75 (BLADE) ×2 IMPLANT
BLADE SURG SZ10 CARB STEEL (BLADE) ×4 IMPLANT
COVER PERINEAL POST (MISCELLANEOUS) ×2 IMPLANT
COVER SURGICAL LIGHT HANDLE (MISCELLANEOUS) ×2 IMPLANT
COVER WAND RF STERILE (DRAPES) IMPLANT
CUP SECTOR GRIPTON 50MM (Cup) ×1 IMPLANT
DRAPE STERI IOBAN 125X83 (DRAPES) ×2 IMPLANT
DRAPE U-SHAPE 47X51 STRL (DRAPES) ×4 IMPLANT
DRESSING AQUACEL AG SP 3.5X10 (GAUZE/BANDAGES/DRESSINGS) IMPLANT
DRSG AQUACEL AG ADV 3.5X10 (GAUZE/BANDAGES/DRESSINGS) ×2 IMPLANT
DRSG AQUACEL AG SP 3.5X10 (GAUZE/BANDAGES/DRESSINGS) ×2
DURAPREP 26ML APPLICATOR (WOUND CARE) ×2 IMPLANT
ELECT REM PT RETURN 15FT ADLT (MISCELLANEOUS) ×2 IMPLANT
GAUZE XEROFORM 1X8 LF (GAUZE/BANDAGES/DRESSINGS) ×2 IMPLANT
GLOVE BIO SURGEON STRL SZ7.5 (GLOVE) ×2 IMPLANT
GLOVE BIOGEL PI IND STRL 8 (GLOVE) ×2 IMPLANT
GLOVE BIOGEL PI INDICATOR 8 (GLOVE) ×2
GLOVE ECLIPSE 8.0 STRL XLNG CF (GLOVE) ×2 IMPLANT
GOWN STRL REUS W/TWL XL LVL3 (GOWN DISPOSABLE) ×4 IMPLANT
HANDPIECE INTERPULSE COAX TIP (DISPOSABLE) ×1
HEAD FEM STD 32X+1 STRL (Hips) ×1 IMPLANT
HOLDER FOLEY CATH W/STRAP (MISCELLANEOUS) ×2 IMPLANT
KIT TURNOVER KIT A (KITS) IMPLANT
LINER ACETABULAR 32X50 (Liner) ×1 IMPLANT
PACK ANTERIOR HIP CUSTOM (KITS) ×2 IMPLANT
SET HNDPC FAN SPRY TIP SCT (DISPOSABLE) ×1 IMPLANT
STAPLER VISISTAT 35W (STAPLE) IMPLANT
STEM CORAIL KLA11 (Stem) ×1 IMPLANT
STRIP CLOSURE SKIN 1/2X4 (GAUZE/BANDAGES/DRESSINGS) ×2 IMPLANT
SUT ETHIBOND NAB CT1 #1 30IN (SUTURE) ×2 IMPLANT
SUT ETHILON 2 0 PS N (SUTURE) IMPLANT
SUT MNCRL AB 4-0 PS2 18 (SUTURE) IMPLANT
SUT VIC AB 0 CT1 36 (SUTURE) ×2 IMPLANT
SUT VIC AB 1 CT1 36 (SUTURE) ×2 IMPLANT
SUT VIC AB 2-0 CT1 27 (SUTURE) ×2
SUT VIC AB 2-0 CT1 TAPERPNT 27 (SUTURE) ×2 IMPLANT
TRAY FOLEY MTR SLVR 14FR STAT (SET/KITS/TRAYS/PACK) ×1 IMPLANT
TRAY FOLEY MTR SLVR 16FR STAT (SET/KITS/TRAYS/PACK) ×1 IMPLANT
YANKAUER SUCT BULB TIP 10FT TU (MISCELLANEOUS) ×2 IMPLANT

## 2019-03-06 NOTE — Anesthesia Postprocedure Evaluation (Signed)
Anesthesia Post Note  Patient: Rebecca [name]  Procedure(s) Performed: LEFT TOTAL HIP ARTHROPLASTY ANTERIOR APPROACH (Left )     Patient location during evaluation: PACU Anesthesia Type: Spinal Level of consciousness: oriented and awake and alert Pain management: pain level controlled Vital Signs Assessment: post-procedure vital signs reviewed and stable Respiratory status: spontaneous breathing, respiratory function stable and patient connected to nasal cannula oxygen Cardiovascular status: blood pressure returned to baseline and stable Postop Assessment: no headache, no backache and no apparent nausea or vomiting Anesthetic complications: no    Last Vitals:  Vitals:   03/06/19 0945 03/06/19 1000  BP: 132/68 (!) 137/52  Pulse: (!) 47 (!) 44  Resp: 15 15  Temp:  36.4 C  SpO2: 100% 100%    Last Pain:  Vitals:   03/06/19 1000  TempSrc:   PainSc: 3                  Kaylin Schellenberg,W. EDMOND

## 2019-03-06 NOTE — Evaluation (Signed)
Physical Therapy Evaluation Patient Details Name: Rebecca [name] MRN: [id number] DOB: 03-12-1941 Today's Date: 03/06/2019   History of Present Illness  s/p L DA THA  Clinical Impression  Pt is s/p THA resulting in the deficits listed below (see PT Problem List).  Anticipate pt should progress well in acute setting. amb ~ 75' today with min assist. 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  Pt will benefit from skilled PT to increase their independence and safety with mobility to allow discharge to the venue listed below.      Follow Up Recommendations Follow surgeon's recommendation for DC plan and follow-up therapies    Equipment Recommendations  None recommended by PT    Recommendations for Other Services       Precautions / Restrictions Precautions Precautions: Fall Restrictions Weight Bearing Restrictions: No Other Position/Activity Restrictions: WBAT      Mobility  Bed Mobility Overal bed mobility: Needs Assistance Bed Mobility: Supine to Sit     Supine to sit: Min assist     General bed mobility comments: cues for technique, assist with LLE  Transfers Overall transfer level: Needs assistance Equipment used: Rolling walker (2 wheeled) Transfers: Sit to/from Stand Sit to Stand: Min assist         General transfer comment: light assist to rise and transition to RW  Ambulation/Gait Ambulation/Gait assistance: Min guard;Min assist Gait Distance (Feet): 65 Feet Assistive device: Rolling walker (2 wheeled) Gait Pattern/deviations: Step-to pattern     General Gait Details: cues for  sequence and safety  Stairs            Wheelchair Mobility    Modified Rankin (Stroke Patients Only)       Balance                                             Pertinent Vitals/Pain Pain Assessment: 0-10 Pain Score: 4  Pain Location:  Left hip Pain Descriptors / Indicators: Sore Pain Intervention(s): Premedicated before session;Monitored during session;Limited activity within patient's tolerance    Home Living Family/patient expects to be discharged to:: Private residence Living Arrangements: Spouse/significant other;Children(spouse has dementia) Available Help at Discharge: Family(family staying as needed) Type of Home: House Home Access: Stairs to enter   [employer] of Steps: 1 Home Layout: Laundry or work area in basement;Two level(would like to be able to go downstairs to movie room) Home Equipment: Gilford Rile - 2 wheels;Bedside commode;Adaptive equipment;Cane - single point      Prior Function Level of Independence: Independent         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     Hand Dominance        Extremity/Trunk Assessment   Upper Extremity Assessment Upper Extremity Assessment: Overall WFL for tasks assessed    Lower Extremity Assessment Lower Extremity Assessment: LLE deficits/detail LLE Deficits / Details: ankle WFL, knee and hip grossly 3/5 limited by post op pain/discomfort/weakness       Communication   Communication: No difficulties  Cognition Arousal/Alertness: Awake/alert Behavior During Therapy: WFL for tasks assessed/performed Overall Cognitive Status: Within Functional Limits for tasks assessed  General Comments      Exercises Total Joint Exercises Ankle Circles/Pumps: AROM;10 reps;Both Quad Sets: Both;10 reps;AROM   Assessment/Plan    PT Assessment Patient needs continued PT services  PT Problem List Decreased strength;Decreased mobility;Decreased knowledge of use of DME;Pain;Decreased activity tolerance       PT Treatment Interventions DME instruction;Gait training;Functional mobility training;Therapeutic activities;Patient/family education;Therapeutic exercise;Stair training    PT Goals  (Current goals can be found in the Care Plan section)  Acute Rehab PT Goals Patient Stated Goal: to be able to do as much as possible PT Goal Formulation: With patient Time For Goal Achievement: 03/13/19 Potential to Achieve Goals: Good    Frequency 7X/week   Barriers to discharge        Co-evaluation               AM-PAC PT "6 Clicks" Mobility  Outcome Measure Help needed turning from your back to your side while in a flat bed without using bedrails?: A Little Help needed moving from lying on your back to sitting on the side of a flat bed without using bedrails?: A Little Help needed moving to and from a bed to a chair (including a wheelchair)?: A Little Help needed standing up from a chair using your arms (e.g., wheelchair or bedside chair)?: A Little Help needed to walk in hospital room?: A Little Help needed climbing 3-5 steps with a railing? : A Lot 6 Click Score: 17    End of Session Equipment Utilized During Treatment: Gait belt Activity Tolerance: Patient tolerated treatment well Patient left: in chair;with call bell/phone within reach   PT Visit Diagnosis: Difficulty in walking, not elsewhere classified (R26.2)    Time: 1102-1117 PT Time Calculation (min) (ACUTE ONLY): 24 min   Charges:   PT Evaluation $PT Eval Low Complexity: 1 Low PT Treatments $Gait Training: 8-22 mins        Kenyon Ana, PT  Pager: [phone] Acute Rehab Dept [facility]): [phone]   03/06/2019   [facility] 03/06/2019, 4:30 PM

## 2019-03-06 NOTE — Op Note (Signed)
NAME: Rebecca [name], FOMBY MEDICAL RECORD UR:[id number] ACCOUNT [id number] DATE OF BIRTH:1940/10/29 FACILITY: [facility] LOCATION: [facility] PHYSICIAN:Ulisses Vondrak [place], MD  OPERATIVE REPORT  DATE OF PROCEDURE:  03/06/2019  PREOPERATIVE DIAGNOSIS:  Primary osteoarthritis and degenerative joint disease, left hip.  POSTOPERATIVE DIAGNOSIS:  Primary osteoarthritis and degenerative joint disease, left hip.  PROCEDURE:  Left total hip arthroplasty through direct anterior approach.  IMPLANTS:  DePuy Sector Gription acetabular component size 50, size 32+0 neutral polyethylene liner, size 11 Corail femoral component with varus offset, size 32+1 metal hip ball.  SURGEON:  Lind Guest. Ninfa Linden, MD  ASSISTANT:  Erskine Emery, PA-C  ANESTHESIA:  Spinal.  ANTIBIOTICS:  Two grams IV Ancef.  ESTIMATED BLOOD LOSS:  150 mL.  COMPLICATIONS:  None.  INDICATIONS:  The patient is a very pleasant 78 year old female with debilitating arthritis involving her left hip.  This is well documented on x-rays and MRI and clinical exam.  She is very spry and young-appearing 78 year old female.  At this point,  her left hip pain has detrimentally affected her activities of daily living, her quality of life, and her mobility to the point she does wish to proceed with a total hip arthroplasty.  She understands fully the risk of acute blood loss anemia, nerve and  vessel injury, fracture, infection, dislocation, DVT, and implant failure.  She understands her goals are to decrease pain, improve mobility, and overall improve quality of life.  DESCRIPTION OF PROCEDURE:  After informed consent was obtained and appropriate left hip was marked.  She was brought to the operating room and sat up on a stretcher where spinal anesthesia was then obtained.  She was laid in supine position on a  stretcher.  I assessed her leg lengths and found them to be equal.  Traction boots were placed on both her feet.  A Foley catheter was  placed, and she was placed supine on the Hana fracture table, the perineal post in place.  Both legs in in-line  skeletal traction device and no traction applied.  Her left operative hip was prepped and draped with DuraPrep and sterile drapes.  A time-out was called, and she was identified as correct patient, correct left hip.  I then made an incision just inferior  and posterior to the anterior superior iliac spine and carried this obliquely down the leg.  I dissected down tensor fascia lata muscle, and the tensor fascia was then divided longitudinally to proceed with a direct anterior approach to the hip.  We  identified and cauterized circumflex vessels.  I then identified the hip capsule.  We opened up the hip capsule in an L-type format finding a moderate joint effusion.  We placed Cobra retractors around the medial and lateral femoral neck and then made  our femoral neck cut with an oscillating saw just proximal to the lesser trochanter and completed this with an osteotome.  We placed a corkscrew guide in the femoral head and removed the femoral head in its entirety and found a wide area devoid of  cartilage.  I then placed a bent Hohmann over the medial acetabular rim and removed remnants of the acetabular labrum and other debris.  We then began reaming under direct visualization from a size 44 reamer in stepwise increments, going up to a size 49  with all reamers under direct visualization, the last reamer also under direct fluoroscopy so we could obtain our depth of reaming, our inclination, and anteversion.  I then placed the real DePuy Sector Gription acetabular component  size 50 and a 32+0  neutral polyethylene liner for that size acetabular component.  Attention was then turned to the femur.  With the leg externally rotated to 120 degrees, extended and adducted, we were able to place a Mueller retractor medially and a Hohmann retractor  behind the greater trochanter.  We released the lateral  joint capsule and used a box-cutting osteotome to enter the femoral canal and a rongeur to lateralize.  Then began broaching using the Corail broaching system from a size 8 going up to a size 11.   With the size 11 in place, we tried a varus offset on the femoral neck based off her anatomy and a 32+1 hip ball, reduced this in the acetabulum, and we were pleased with stability and range of motion and felt that she is certainly tight, but did not  have any choice of going with smaller implants at all.  Her offset looked good.  We dislocated the hip and removed the trial components.  We then placed the real Corail femoral component with varus offset size 11 and the real 32+1 metal hip ball.  Again  reduced this in the acetabulum, and we were pleased with stability and range of motion.  We then irrigated the soft tissue with normal saline solution using pulsatile lavage.  We closed the joint capsule with interrupted #1 Ethibond suture, followed by  running #1 Vicryl to close the tensor fascia, 0 Vicryl was used to close the deep tissue, 2-0 Vicryl was used to close the subcutaneous tissue, and 4-0 Monocryl subcuticular stitch was used.  Steri-Strips were applied on the skin as well as an Aquacel  dressing.  She was taken to recovery room in stable condition.  All final counts were correct.  There were no complications noted.  Note Benita Stabile, PA-C, assisted the entire case.  His assistance was crucial for facilitating all aspects of this case.  LN/NUANCE  D:03/06/2019 T:03/06/2019 JOB:[id number]

## 2019-03-06 NOTE — Brief Op Note (Signed)
03/06/2019  8:29 AM  PATIENT:  Carren Rang  78 y.o. female  PRE-OPERATIVE DIAGNOSIS:  osteoarthritis left hip  POST-OPERATIVE DIAGNOSIS:  osteoarthritis left hip  PROCEDURE:  Procedure(s): LEFT TOTAL HIP ARTHROPLASTY ANTERIOR APPROACH (Left)  SURGEON:  Surgeon(s) and Role:    Mcarthur Rossetti, MD - Primary  PHYSICIAN ASSISTANT: Benita Stabile, PA-C  ANESTHESIA:   spinal  EBL:  150 mL   COUNTS:  YES  DICTATION: .Other Dictation: Dictation Number [id number]  PLAN OF CARE: Admit to inpatient   PATIENT DISPOSITION:  Stable to PACU   Delay start of Pharmacological VTE agent (>24hrs) due to surgical blood loss or risk of bleeding: no

## 2019-03-06 NOTE — Anesthesia Postprocedure Evaluation (Signed)
Anesthesia Post Note  Patient: Rebecca [name]  Procedure(s) Performed: LEFT TOTAL HIP ARTHROPLASTY ANTERIOR APPROACH (Left )     Patient location during evaluation: PACU Anesthesia Type: Spinal Level of consciousness: oriented and awake and alert Pain management: pain level controlled Vital Signs Assessment: post-procedure vital signs reviewed and stable Respiratory status: spontaneous breathing, respiratory function stable and patient connected to nasal cannula oxygen Cardiovascular status: blood pressure returned to baseline and stable Postop Assessment: no headache, no backache, no apparent nausea or vomiting, spinal receding and patient able to bend at knees Anesthetic complications: no    Last Vitals:  Vitals:   03/06/19 1034 03/06/19 1154  BP: 134/73 (!) 180/69  Pulse: (!) 52 63  Resp: 16 16  Temp: (!) 36.4 C 36.7 C  SpO2: 100% 100%    Last Pain:  Vitals:   03/06/19 1034  TempSrc: Oral  PainSc:                  Kalysta Kneisley,W. EDMOND

## 2019-03-06 NOTE — H&P (Signed)
TOTAL HIP ADMISSION H&P  Patient is admitted for left total hip arthroplasty.  Subjective:  Chief Complaint: left hip pain  HPI: Rebecca [name], 78 y.o. female, has a history of pain and functional disability in the left hip(s) due to arthritis and patient has failed non-surgical conservative treatments for greater than 12 weeks to include NSAID's and/or analgesics, corticosteriod injections, flexibility and strengthening excercises, use of assistive devices and activity modification.  Onset of symptoms was gradual starting 3 years ago with gradually worsening course since that time.The patient noted no past surgery on the left hip(s).  Patient currently rates pain in the left hip at 10 out of 10 with activity. Patient has night pain, worsening of pain with activity and weight bearing, pain that interfers with activities of daily living and pain with passive range of motion. Patient has evidence of subchondral cysts, subchondral sclerosis, periarticular osteophytes and joint space narrowing by imaging studies. This condition presents safety issues increasing the risk of falls.  There is no current active infection.  Patient Active Problem List   Diagnosis Date Noted  . Unilateral primary osteoarthritis, left hip 01/19/2019   Past Medical History:  Diagnosis Date  . Anemia   . Breast CA ([place])    Left  . GERD (gastroesophageal reflux disease)   . History of cataract   . History of hiatal hernia   . Hypothyroidism   . OA (osteoarthritis)   . Personal history of radiation therapy   . PONV (postoperative nausea and vomiting)   . Vitamin D deficiency     Past Surgical History:  Procedure Laterality Date  . BREAST EXCISIONAL BIOPSY Left   . BREAST LUMPECTOMY Left   . CATARACT EXTRACTION, BILATERAL    . COLONOSCOPY  01/03/2007  . UPPER GI ENDOSCOPY  09/23/2007    Current Facility-Administered Medications  Medication Dose Route Frequency Provider Last Rate Last Dose  . ceFAZolin (ANCEF)  IVPB 2g/100 mL premix  2 g Intravenous On Call to OR Pete Pelt, PA-C      . chlorhexidine (HIBICLENS) 4 % liquid 4 application  60 mL Topical Once Pete Pelt, PA-C      . lactated ringers infusion   Intravenous Continuous Montez Hageman, MD 10 mL/hr at 03/06/19 0551    . povidone-iodine 10 % swab 2 application  2 application Topical Once Erskine Emery W, PA-C      . tranexamic acid (CYKLOKAPRON) IVPB 1,000 mg  1,000 mg Intravenous To OR Pete Pelt, PA-C       No Known Allergies  Social History   Tobacco Use  . Smoking status: Never Smoker  . Smokeless tobacco: Never Used  Substance Use Topics  . Alcohol use: Yes    Comment: occ    Family History  Problem Relation Age of Onset  . Breast cancer Sister      Review of Systems  Musculoskeletal: Positive for joint pain.  All other systems reviewed and are negative.   Objective:  Physical Exam  Constitutional: She is oriented to person, place, and time. She appears well-developed and well-nourished.  HENT:  Head: Normocephalic and atraumatic.  Eyes: Pupils are equal, round, and reactive to light. EOM are normal.  Neck: Normal range of motion. Neck supple.  Cardiovascular: Normal rate.  Respiratory: Effort normal.  GI: Soft.  Musculoskeletal:     Left hip: She exhibits decreased range of motion, decreased strength, tenderness and bony tenderness.  Neurological: She is alert and oriented to person, place,  and time.  Skin: Skin is warm and dry.  Psychiatric: She has a normal mood and affect.    Vital signs in last 24 hours: Temp:  [98.4 F (36.9 C)-98.6 F (37 C)] 98.4 F (36.9 C) (07/24 0540) Pulse Rate:  [66-87] 87 (07/24 0540) Resp:  [18] 18 (07/24 0540) BP: (140-146)/(68-70) 140/68 (07/24 0540) SpO2:  [100 %] 100 % (07/24 0540) Weight:  [63 kg] 63 kg (07/24 0500)  Labs:   Estimated body mass index is 22.44 kg/m as calculated from the following:   Height as of this encounter: 5\' 6"  (1.676  m).   Weight as of this encounter: 63 kg.   Imaging Review Plain radiographs demonstrate severe degenerative joint disease of the left hip(s). The bone quality appears to be good for age and reported activity level.      Assessment/Plan:  End stage arthritis, left hip(s)  The patient history, physical examination, clinical judgement of the provider and imaging studies are consistent with end stage degenerative joint disease of the left hip(s) and total hip arthroplasty is deemed medically necessary. The treatment options including medical management, injection therapy, arthroscopy and arthroplasty were discussed at length. The risks and benefits of total hip arthroplasty were presented and reviewed. The risks due to aseptic loosening, infection, stiffness, dislocation/subluxation,  thromboembolic complications and other imponderables were discussed.  The patient acknowledged the explanation, agreed to proceed with the plan and consent was signed. Patient is being admitted for inpatient treatment for surgery, pain control, PT, OT, prophylactic antibiotics, VTE prophylaxis, progressive ambulation and ADL's and discharge planning.The patient is planning to be discharged home with home health services

## 2019-03-06 NOTE — Anesthesia Procedure Notes (Signed)
Spinal  Patient location during procedure: OR Start time: 03/06/2019 7:24 AM End time: 03/06/2019 7:26 AM Staffing Anesthesiologist: Roderic Palau, MD Performed: anesthesiologist  Preanesthetic Checklist Completed: patient identified, surgical consent, pre-op evaluation, timeout performed, IV checked, risks and benefits discussed and monitors and equipment checked Spinal Block Patient position: sitting Prep: DuraPrep Patient monitoring: cardiac monitor, continuous pulse ox and blood pressure Approach: right paramedian (Attempted midline by CRNA and MD) Location: L3-4 Injection technique: single-shot Needle Needle type: Quincke  Needle gauge: 22 G Needle length: 9 cm Assessment Sensory level: T8 Additional Notes Functioning IV was confirmed and monitors were applied. Sterile prep and drape, including hand hygiene and sterile gloves were used. The patient was positioned and the spine was prepped. The skin was anesthetized with lidocaine.  Free flow of clear CSF was obtained prior to injecting local anesthetic into the CSF.  The spinal needle aspirated freely following injection.  The needle was carefully withdrawn.  The patient tolerated the procedure well. Procedure attempted by CRNA prior to MD.

## 2019-03-06 NOTE — Transfer of Care (Signed)
Immediate Anesthesia Transfer of Care Note  Patient: Rebecca [name]  Procedure(s) Performed: LEFT TOTAL HIP ARTHROPLASTY ANTERIOR APPROACH (Left )  Patient Location: PACU  Anesthesia Type:Spinal  Level of Consciousness: awake, alert , oriented and patient cooperative  Airway & Oxygen Therapy: Patient Spontanous Breathing and Patient connected to face mask oxygen  Post-op Assessment: Report given to RN and Post -op Vital signs reviewed and stable  Post vital signs: stable  Last Vitals:  Vitals Value Taken Time  BP 112/45 03/06/19 0900  Temp    Pulse 61 03/06/19 0902  Resp 20 03/06/19 0902  SpO2 100 % 03/06/19 0902  Vitals shown include unvalidated device data.  Last Pain:  Vitals:   03/06/19 0544  TempSrc:   PainSc: 0-No pain         Complications: No apparent anesthesia complications

## 2019-03-06 NOTE — Anesthesia Procedure Notes (Signed)
Procedure Name: MAC Date/Time: 03/06/2019 8:16 AM Performed by: Lissa Morales, CRNA Pre-anesthesia Checklist: Patient identified, Emergency Drugs available, Suction available, Patient being monitored and Timeout performed Patient Re-evaluated:Patient Re-evaluated prior to induction Oxygen Delivery Method: Simple face mask Placement Confirmation: positive ETCO2

## 2019-03-07 DIAGNOSIS — M1612 Unilateral primary osteoarthritis, left hip: Secondary | ICD-10-CM | POA: Diagnosis not present

## 2019-03-07 LAB — CBC
HCT: 33.1 % — ABNORMAL LOW (ref 36.0–46.0)
Hemoglobin: 10.6 g/dL — ABNORMAL LOW (ref 12.0–15.0)
MCH: 30.3 pg (ref 26.0–34.0)
MCHC: 32 g/dL (ref 30.0–36.0)
MCV: 94.6 fL (ref 80.0–100.0)
Platelets: 235 10*3/uL (ref 150–400)
RBC: 3.5 MIL/uL — ABNORMAL LOW (ref 3.87–5.11)
RDW: 12.8 % (ref 11.5–15.5)
WBC: 10.9 10*3/uL — ABNORMAL HIGH (ref 4.0–10.5)
nRBC: 0 % (ref 0.0–0.2)

## 2019-03-07 LAB — BASIC METABOLIC PANEL
Anion gap: 5 (ref 5–15)
BUN: 18 mg/dL (ref 8–23)
CO2: 23 mmol/L (ref 22–32)
Calcium: 8.1 mg/dL — ABNORMAL LOW (ref 8.9–10.3)
Chloride: 111 mmol/L (ref 98–111)
Creatinine, Ser: 0.56 mg/dL (ref 0.44–1.00)
GFR calc Af Amer: >60 mL/min (ref >60)
GFR calc non Af Amer: >60 mL/min (ref >60)
Glucose, Bld: 105 mg/dL — ABNORMAL HIGH (ref 70–99)
Potassium: 3.6 mmol/L (ref 3.5–5.1)
Sodium: 139 mmol/L (ref 135–145)

## 2019-03-07 MED ORDER — TRAMADOL HCL 50 MG PO TABS: 50.0000 mg | ORAL_TABLET | Freq: Four times a day (QID) | ORAL | 0 refills | Status: DC | PRN

## 2019-03-07 MED ORDER — TIZANIDINE HCL 2 MG PO TABS: 2.0000 mg | ORAL_TABLET | Freq: Three times a day (TID) | ORAL | 0 refills | Status: DC | PRN

## 2019-03-07 MED ORDER — ASPIRIN 81 MG PO CHEW: 81.0000 mg | CHEWABLE_TABLET | Freq: Two times a day (BID) | ORAL | 0 refills | Status: DC

## 2019-03-07 NOTE — Progress Notes (Signed)
Physical Therapy Treatment Patient Details Name: Rebecca [name] MRN: [id number] DOB: December 05, 1940 Today's Date: 03/07/2019    History of Present Illness s/p L DA THA    PT Comments     pt is progressing well. 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 Continue PT POC  Follow Up Recommendations  Follow surgeon's recommendation for DC plan and follow-up therapies     Equipment Recommendations  None recommended by PT    Recommendations for Other Services       Precautions / Restrictions Precautions Precautions: Fall Restrictions Weight Bearing Restrictions: No Other Position/Activity Restrictions: WBAT    Mobility  Bed Mobility      pt in recliner on arrival            Transfers Overall transfer level: Needs assistance Equipment used: Rolling walker (2 wheeled) Transfers: Sit to/from Stand Sit to Stand: Min guard         General transfer comment: light assist to rise and transition to RW  Ambulation/Gait Ambulation/Gait assistance: Min guard;Min assist Gait Distance (Feet): 120 Feet Assistive device: Rolling walker (2 wheeled) Gait Pattern/deviations: Step-to pattern;Step-through pattern     General Gait Details: cues for  sequence and safety, gait progression   Stairs             Wheelchair Mobility    Modified Rankin (Stroke Patients Only)       Balance                                            Cognition Arousal/Alertness: Awake/alert Behavior During Therapy: WFL for tasks assessed/performed Overall Cognitive Status: Within Functional Limits for tasks assessed                                        Exercises Total Joint Exercises Ankle Circles/Pumps: AROM;10 reps;Both Quad Sets: Both;10 reps;AROM Gluteal Sets: AROM;Both;10 reps    General Comments         Pertinent Vitals/Pain Pain Assessment: 0-10 Pain Score: 3  Pain Location: Left hip Pain Descriptors / Indicators: Sore Pain Intervention(s): Limited activity within patient's tolerance;Monitored during session;Premedicated before session;Repositioned    Home Living                      Prior Function            PT Goals (current goals can now be found in the care plan section) Acute Rehab PT Goals Patient Stated Goal: to be able to do as much as possible PT Goal Formulation: With patient Time For Goal Achievement: 03/13/19 Potential to Achieve Goals: Good Progress towards PT goals: Progressing toward goals    Frequency    7X/week      PT Plan Current plan remains appropriate    Co-evaluation              AM-PAC PT "6 Clicks" Mobility   Outcome Measure  Help needed turning from your back to your side while in a flat bed without using bedrails?: A Little Help needed moving from lying on your back to sitting on the side of a flat bed  without using bedrails?: A Little Help needed moving to and from a bed to a chair (including a wheelchair)?: A Little Help needed standing up from a chair using your arms (e.g., wheelchair or bedside chair)?: A Little Help needed to walk in hospital room?: A Little Help needed climbing 3-5 steps with a railing? : A Little 6 Click Score: 18    End of Session Equipment Utilized During Treatment: Gait belt Activity Tolerance: Patient tolerated treatment well Patient left: in chair;with call bell/phone within reach;with chair alarm set   PT Visit Diagnosis: Difficulty in walking, not elsewhere classified (R26.2)     Time: 8110-3159 PT Time Calculation (min) (ACUTE ONLY): 24 min  Charges:  $Gait Training: 23-37 mins                     Kenyon Ana, PT  Pager: [phone] Acute Rehab Dept [facility]): [phone]   03/07/2019    [facility] 03/07/2019, 11:17 AM

## 2019-03-07 NOTE — TOC Initial Note (Signed)
Transition of Care [facility]) - Initial/Assessment Note    Patient Details  Name: Rebecca [name] MRN: [id number] Date of Birth: [birth year]  Transition of Care [facility]) CM/SW Contact:    Joaquin Courts, RN Phone Number: 03/07/2019, 3:25 PM  Clinical Narrative:    CM spoke with patient at bedside, patient set up with [facility] for [place]. Patient reports she has rolling walker and 3-in-1 at home.                Expected Discharge Plan: [place] Barriers to Discharge: Continued Medical Work up   Patient Goals and CMS Choice Patient states their goals for this hospitalization and ongoing recovery are:: to go home CMS Medicare.gov Compare Post Acute Care list provided to:: Patient Choice offered to / list presented to : Patient  Expected Discharge Plan and Services Expected Discharge Plan: [place]   Discharge Planning Services: CM Consult Post Acute Care Choice: [place] arrangements for the past 2 months: Single Family Home Expected Discharge Date: 03/08/19               DME Arranged: N/A DME Agency: NA       HH Arranged: PT HH Agency: [facility] [employer]) Date [place]: 03/07/19 Time [place]: [place] Representative spoke with at [place]: Pre arranged at MD Office  Prior Living Arrangements/Services Living arrangements for the past 2 months: [place] with:: Spouse Patient language and need for interpreter reviewed:: Yes Do you feel safe going back to the place where you live?: Yes      Need for Family Participation in Patient Care: Yes (Comment) Care giver support system in place?: Yes (comment)   Criminal Activity/Legal Involvement Pertinent to Current Situation/Hospitalization: No - Comment as needed  Activities of Daily Living Home Assistive Devices/Equipment: Bedside commode/3-in-1, Walker (specify type) ADL Screening (condition at time of  admission) Patient's cognitive ability adequate to safely complete daily activities?: Yes Is the patient deaf or have difficulty hearing?: No Does the patient have difficulty seeing, even when wearing glasses/contacts?: No Does the patient have difficulty concentrating, remembering, or making decisions?: No Patient able to express need for assistance with ADLs?: Yes Does the patient have difficulty dressing or bathing?: No Independently performs ADLs?: Yes (appropriate for developmental age) Does the patient have difficulty walking or climbing stairs?: Yes Weakness of Legs: None Weakness of Arms/Hands: None  Permission Sought/Granted                  Emotional Assessment Appearance:: Appears stated age Attitude/Demeanor/Rapport: Engaged Affect (typically observed): Accepting Orientation: : Oriented to Place, Oriented to  Time, Oriented to Situation, Oriented to Self   Psych Involvement: No (comment)  Admission diagnosis:  osteoarthritis left hip Patient Active Problem List   Diagnosis Date Noted  . Status post total replacement of left hip 03/06/2019  . Unilateral primary osteoarthritis, left hip 01/19/2019   PCP:  Marton Redwood, MD Pharmacy:   [facility] - [address] [place] [address] Phone: [phone] Fax: [phone]  [id number] - [place], [place] [address] [place] [address] Phone: [phone] Fax: [phone]     Social Determinants of Health (SDOH) Interventions    Readmission Risk Interventions No flowsheet data found.

## 2019-03-07 NOTE — Plan of Care (Signed)

## 2019-03-07 NOTE — Progress Notes (Signed)
Home health agencies that serve [phone].        [place] Quality of Patient Care Rating Patient Survey Summary Rating  [facility] [phone] 4 out of 5 stars 4 out of [place] [phone] 3 out of 5 stars 5 out of [place] [phone] 3 out of 5 stars 4 out of [place] [phone]  out of 5 stars 3 out of [place] [phone] 4 out of 5 stars 4 out of [place] [phone] 4 out of 5 stars 4 out of [place] [phone] 4  out of 5 stars 4 out of [place] [phone] 4 out of 5 stars 4 out of 5 stars  [facility] [place] [phone] 3  out of 5 stars 4 out of [place] [phone] 3 out of 5 stars 4 out of 5 stars  [facility] [phone] 4 out of 5 stars Not Available12  [facility] [phone] 3  out of 5 stars 3 out of 5 stars  [facility] ([phone]) [phone] 3  out of 5 stars 4 out of [place] [phone] 3  out of 5 stars 4 out of [place] [phone] 3  out of 5 stars 3 out of [place] [phone] 3  out of 5 stars [place] [phone] 3  out of 5 stars 4 out of [place] [phone] 4  out of 5 stars 3 out of [place] [phone] 4  out of 5 stars 2 out of [place] number Footnote as displayed on [place]  1 This agency provides services under a federal waiver program to non-traditional, chronic long term population.  2 This agency provides services to a  special needs population.  3 Not Available.  4 The number of patient episodes for this measure is too small to report.  5 This measure currently does not have data or provider has been certified/recertified for less than 6 months.  6 The national average for this measure is not provided because of state-to-state differences in data collection.  7 Medicare is not displaying rates for this measure for any home health agency, because of an issue with the data.  8 There were problems with the data and they are being corrected.  9 Zero, or very few, patients met the survey's rules for inclusion. The scores shown, if any, reflect a very small number of surveys and may not accurately tell how an agency is doing.  10 Survey results are based on less than 12 months of data.  11 Fewer than 70 patients completed the survey. Use the scores shown, if any, with caution as the number of surveys may be too low to accurately tell how an agency is doing.  12 No survey results are available for this period.  13 Data suppressed by CMS  for one or more quarters.

## 2019-03-07 NOTE — Care Management Obs Status (Signed)
MEDICARE OBSERVATION STATUS NOTIFICATION   Patient Details  Name: Rebecca [name] MRN: [id number] Date of Birth: 1941/02/24   Medicare Observation Status Notification Given:  Yes    Joaquin Courts, RN 03/07/2019, 10:43 AM

## 2019-03-07 NOTE — Progress Notes (Signed)
Subjective: 1 Day Post-Op Procedure(s) (LRB): LEFT TOTAL HIP ARTHROPLASTY ANTERIOR APPROACH (Left) Patient reports pain as mild.    Objective: Vital signs in last 24 hours: Temp:  [97.7 F (36.5 C)-98.4 F (36.9 C)] 97.8 F (36.6 C) (07/25 1013) Pulse Rate:  [52-67] 67 (07/25 1013) Resp:  [16-18] 16 (07/25 1013) BP: (107-164)/(42-83) 132/57 (07/25 1013) SpO2:  [99 %-100 %] 100 % (07/25 1013)  Intake/Output from previous day: 07/24 0701 - 07/25 0700 In: 4108.9 [P.O.:480; I.V.:3628.9] Out: 3725 [Urine:3575; Blood:150] Intake/Output this shift: Total I/O In: 240 [P.O.:240] Out: -   Recent Labs    03/05/19 0830 03/07/19 0318  HGB 12.9 10.6*   Recent Labs    03/05/19 0830 03/07/19 0318  WBC 5.6 10.9*  RBC 4.47 3.50*  HCT 41.8 33.1*  PLT 308 235   Recent Labs    03/07/19 0318  NA 139  K 3.6  CL 111  CO2 23  BUN 18  CREATININE 0.56  GLUCOSE 105*  CALCIUM 8.1*   No results for input(s): LABPT, INR in the last 72 hours.  Sensation intact distally Intact pulses distally Dorsiflexion/Plantar flexion intact Incision: dressing C/D/I   Assessment/Plan: 1 Day Post-Op Procedure(s) (LRB): LEFT TOTAL HIP ARTHROPLASTY ANTERIOR APPROACH (Left) Up with therapy Plan for discharge tomorrow Discharge home with home health      Mcarthur Rossetti 03/07/2019, 12:27 PM

## 2019-03-07 NOTE — Plan of Care (Signed)
  Problem: Activity: ?Goal: Ability to avoid complications of mobility impairment will improve ?Outcome: Progressing ?  ?Problem: Clinical Measurements: ?Goal: Postoperative complications will be avoided or minimized ?Outcome: Progressing ?  ?Problem: Pain Management: ?Goal: Pain level will decrease with appropriate interventions ?Outcome: Progressing ?  ?Problem: Activity: ?Goal: Ability to avoid complications of mobility impairment will improve ?Outcome: Progressing ?  ?

## 2019-03-07 NOTE — Progress Notes (Signed)
Pt up in chair with no needs or pain to report. Pt stable at time of bedside rounding with Rich Fuchs.

## 2019-03-07 NOTE — Progress Notes (Signed)
   03/07/19 1600  PT Visit Information  Last PT Received On 03/07/19 Pt is progressing well. Will  plan to review stairs, further educate on HEP tomorrow  Assistance Needed +1  History of Present Illness s/p L DA THA  Subjective Data  Patient Stated Goal to be able to do as much as possible  Precautions  Precautions Fall  Restrictions  Weight Bearing Restrictions No  Other Position/Activity Restrictions WBAT  Pain Assessment  Pain Assessment 0-10  Pain Location Left hip  Pain Descriptors / Indicators Sore  Pain Intervention(s) Limited activity within patient's tolerance;Monitored during session;Repositioned  Cognition  Arousal/Alertness Awake/alert  Behavior During Therapy [facility] for tasks assessed/performed  Overall Cognitive Status Within Functional Limits for tasks assessed  Bed Mobility  General bed mobility comments pt in chair  Transfers  Overall transfer level Needs assistance  Equipment used Rolling walker (2 wheeled)  Transfers Sit to/from Stand  Sit to Stand Min guard;Supervision (from bed and BSC)  General transfer comment cues for hand placement   Ambulation/Gait  Ambulation/Gait assistance Min guard;Supervision  Assistive device Rolling walker (2 wheeled)  Gait Pattern/deviations Step-to pattern;Step-through pattern  General Gait Details cues for  sequence and safety, gait progression  Total Joint Exercises  Ankle Circles/Pumps AROM;10 reps;Both  Quad Sets Both;10 reps;AROM  Gluteal Sets AROM;Both;10 reps  Hip ABduction/ADduction AROM;Right;10 reps  Short Arc Quad AROM;Right;10 reps  Heel Slides AAROM;AROM;Right;10 reps  PT - End of Session  Equipment Utilized During Treatment Gait belt  Activity Tolerance Patient tolerated treatment well  Patient left in chair;with call bell/phone within reach;with chair alarm set   PT - Assessment/Plan  PT Plan Current plan remains appropriate  PT Visit Diagnosis Difficulty in walking, not elsewhere classified (R26.2)  PT  Frequency (ACUTE ONLY) 7X/week  Follow Up Recommendations Follow surgeon's recommendation for DC plan and follow-up therapies  PT equipment None recommended by PT  AM-PAC PT "6 Clicks" Mobility Outcome Measure (Version 2)  Help needed turning from your back to your side while in a flat bed without using bedrails? 3  Help needed moving from lying on your back to sitting on the side of a flat bed without using bedrails? 3  Help needed moving to and from a bed to a chair (including a wheelchair)? 3  Help needed standing up from a chair using your arms (e.g., wheelchair or bedside chair)? 3  Help needed to walk in hospital room? 3  Help needed climbing 3-5 steps with a railing?  3  6 Click Score 18  Consider Recommendation of Discharge To: Home with [facility]  PT Goal Progression  Progress towards PT goals Progressing toward goals  Acute Rehab PT Goals  PT Goal Formulation With patient  Time For Goal Achievement 03/13/19  Potential to Achieve Goals Good  PT Time Calculation  PT Start Time (ACUTE ONLY) 1531  PT Stop Time (ACUTE ONLY) 1558  PT Time Calculation (min) (ACUTE ONLY) 27 min  PT General Charges  $$ ACUTE PT VISIT 1 Visit  PT Treatments  $Gait Training 8-22 mins  $Therapeutic Exercise 8-22 mins

## 2019-03-07 NOTE — Discharge Instructions (Signed)

## 2019-03-08 DIAGNOSIS — M1612 Unilateral primary osteoarthritis, left hip: Secondary | ICD-10-CM | POA: Diagnosis not present

## 2019-03-08 NOTE — Progress Notes (Signed)
Pt stable with no needs at time of d/c. No questions or concerns with d/c instructions and education. Pt has all needed home equipment.

## 2019-03-08 NOTE — Progress Notes (Signed)
   Subjective: 2 Days Post-Op Procedure(s) (LRB): LEFT TOTAL HIP ARTHROPLASTY ANTERIOR APPROACH (Left) Patient reports pain as mild and moderate.    Objective: Vital signs in last 24 hours: Temp:  [97.7 F (36.5 C)-98.9 F (37.2 C)] 98.9 F (37.2 C) (07/26 0601) Pulse Rate:  [58-68] 67 (07/26 0601) Resp:  [16] 16 (07/26 0601) BP: (114-132)/(48-57) 119/48 (07/26 0601) SpO2:  [93 %-100 %] 93 % (07/26 0601)  Intake/Output from previous day: 07/25 0701 - 07/26 0700 In: 1080 [P.O.:1080] Out: 300 [Urine:300] Intake/Output this shift: Total I/O In: 240 [P.O.:240] Out: -   Recent Labs    03/05/19 0830 03/07/19 0318  HGB 12.9 10.6*   Recent Labs    03/05/19 0830 03/07/19 0318  WBC 5.6 10.9*  RBC 4.47 3.50*  HCT 41.8 33.1*  PLT 308 235   Recent Labs    03/07/19 0318  NA 139  K 3.6  CL 111  CO2 23  BUN 18  CREATININE 0.56  GLUCOSE 105*  CALCIUM 8.1*   No results for input(s): LABPT, INR in the last 72 hours.  Neurologically intact No results found.  Assessment/Plan: 2 Days Post-Op Procedure(s) (LRB): LEFT TOTAL HIP ARTHROPLASTY ANTERIOR APPROACH (Left) Up with therapy, home today after stairs.   Rebecca [name] 03/08/2019, 8:24 AM

## 2019-03-08 NOTE — Discharge Summary (Signed)
Patient ID: LINDSIE SIMAR MRN: [id number] DOB/AGE: 1941-07-11 78 y.o.  Admit date: 03/06/2019 Discharge date: 03/08/2019  Admission Diagnoses:  Principal Problem:   Unilateral primary osteoarthritis, left hip Active Problems:   Status post total replacement of left hip   Discharge Diagnoses:  Same  Past Medical History:  Diagnosis Date  . Anemia   . Breast CA ([facility])    Left  . GERD (gastroesophageal reflux disease)   . History of cataract   . History of hiatal hernia   . Hypothyroidism   . OA (osteoarthritis)   . Personal history of radiation therapy   . PONV (postoperative nausea and vomiting)   . Vitamin D deficiency     Surgeries: Procedure(s): LEFT TOTAL HIP ARTHROPLASTY ANTERIOR APPROACH on 03/06/2019   Consultants:   Discharged Condition: Improved  Hospital Course: JERZI TIGERT is an 78 y.o. female who was admitted 03/06/2019 for operative treatment ofUnilateral primary osteoarthritis, left hip. Patient has severe unremitting pain that affects sleep, daily activities, and work/hobbies. After pre-op clearance the patient was taken to the operating room on 03/06/2019 and underwent  Procedure(s): LEFT TOTAL HIP ARTHROPLASTY ANTERIOR APPROACH.    Patient was given perioperative antibiotics:  Anti-infectives (From admission, onward)   Start     Dose/Rate Route Frequency Ordered Stop   03/06/19 1300  ceFAZolin (ANCEF) IVPB 1 g/50 mL premix     1 g 100 mL/hr over 30 Minutes Intravenous Every 6 hours 03/06/19 1031 03/06/19 2014   03/06/19 0600  ceFAZolin (ANCEF) IVPB 2g/100 mL premix     2 g 200 mL/hr over 30 Minutes Intravenous On call to O.R. 03/06/19 0530 03/06/19 0716       Patient was given sequential compression devices, early ambulation, and chemoprophylaxis to prevent DVT.  Patient benefited maximally from hospital stay and there were no complications.    Recent vital signs:  Patient Vitals for the past 24 hrs:  BP Temp Temp src Pulse Resp SpO2   03/08/19 0601 (!) 119/48 98.9 F (37.2 C) Oral 67 16 93 %  03/07/19 2237 (!) 122/49 98.4 F (36.9 C) Oral 68 16 98 %     Recent laboratory studies:  Recent Labs    03/07/19 0318  WBC 10.9*  HGB 10.6*  HCT 33.1*  PLT 235  NA 139  K 3.6  CL 111  CO2 23  BUN 18  CREATININE 0.56  GLUCOSE 105*  CALCIUM 8.1*     Discharge Medications:   Allergies as of 03/08/2019   No Known Allergies     Medication List    STOP taking these medications   aspirin EC 81 MG tablet Replaced by: aspirin 81 MG chewable tablet     TAKE these medications   aspirin 81 MG chewable tablet Chew 1 tablet (81 mg total) by mouth 2 (two) times daily. Replaces: aspirin EC 81 MG tablet   cholecalciferol 25 MCG (1000 UT) tablet Commonly known as: VITAMIN D3 Take 1,000 Units by mouth daily.   Fish Oil 1000 MG Caps Take 1,000 mg by mouth daily.   levothyroxine 75 MCG tablet Commonly known as: SYNTHROID Take 75 mcg by mouth daily before breakfast.   magnesium oxide 400 MG tablet Commonly known as: MAG-OX Take 400 mg by mouth daily.   omeprazole 20 MG capsule Commonly known as: PRILOSEC Take 10 mg by mouth every other day.   Poly-Iron 150 150 MG capsule Generic drug: iron polysaccharides Take 150 mg by mouth daily.   SUPER B  COMPLEX PO Take 1 tablet by mouth daily.   tiZANidine 2 MG tablet Commonly known as: ZANAFLEX Take 1 tablet (2 mg total) by mouth every 8 (eight) hours as needed.   traMADol 50 MG tablet Commonly known as: Ultram Take 1 tablet (50 mg total) by mouth every 6 (six) hours as needed.       Diagnostic Studies: Dg Pelvis Portable  Result Date: 03/06/2019 CLINICAL DATA:  Status post left hip replacement. EXAM: PORTABLE PELVIS 1-2 VIEWS COMPARISON:  Radiographs of October 23, 2018. FINDINGS: The left femoral and acetabular components appear to be well situated. Expected postoperative changes are seen in the surrounding soft tissues. No fracture or dislocation is noted.  IMPRESSION: Status post left total hip arthroplasty. Electronically Signed   By: Marijo Conception M.D.   On: 03/06/2019 09:50   Dg C-arm 1-60 Min-no Report  Result Date: 03/06/2019 Fluoroscopy was utilized by the requesting physician.  No radiographic interpretation.   Dg Hip Operative Unilat W Or W/o Pelvis Left  Result Date: 03/06/2019 CLINICAL DATA:  Left hip replacement. EXAM: OPERATIVE LEFT HIP (WITH PELVIS IF PERFORMED)  VIEWS TECHNIQUE: Fluoroscopic spot image(s) were submitted for interpretation post-operatively. COMPARISON:  October 23, 2018 FINDINGS: Single frontal image of the pelvis post left total hip arthroplasty demonstrates normal alignment of the orthopedic hardware. There are no fractures. Expected postsurgical soft tissue emphysema and edema. Fluoroscopy time is recorded as 20 seconds IMPRESSION: Post left hip arthroplasty without evidence of immediate complications. Electronically Signed   By: Fidela Salisbury M.D.   On: 03/06/2019 09:47    Disposition:     Follow-up Information    Mcarthur Rossetti, MD Follow up in 2 week(s).   Specialty: Orthopedic Surgery Contact information: [place] [address] [phone]        [facility] Follow up.   Specialty: Home Health Services Why: agency wil provide home health physical therapy. agency will call you to schedule first visit. Contact information: [address] [place]  [place] [address] [phone]            Signed: Mcarthur Rossetti 03/08/2019, 8:41 PM

## 2019-03-08 NOTE — Progress Notes (Signed)
Physical Therapy Treatment Patient Details Name: Rebecca [name] MRN: [id number] DOB: [birth year] Today's Date: 03/08/2019    History of Present Illness s/p L DA THA    PT Comments    Pt is progressing well. Reviewed stairs, gait, HEP. Pt ready for d/c from PT standpoint   Follow Up Recommendations  Follow surgeon's recommendation for DC plan and follow-up therapies     Equipment Recommendations  None recommended by PT    Recommendations for Other Services       Precautions / Restrictions Precautions Precautions: Fall Restrictions Weight Bearing Restrictions: No Other Position/Activity Restrictions: WBAT    Mobility  Bed Mobility               General bed mobility comments: pt in chair  Transfers Overall transfer level: Needs assistance Equipment used: Rolling walker (2 wheeled) Transfers: Sit to/from Stand Sit to Stand: Modified independent (Device/Increase time);Supervision         General transfer comment: cues for hand placement, self corrects end of session  Ambulation/Gait Ambulation/Gait assistance: Supervision;Modified independent (Device/Increase time) Gait Distance (Feet): 140 Feet Assistive device: Rolling walker (2 wheeled) Gait Pattern/deviations: Step-to pattern;Step-through pattern     General Gait Details: cues for progression, improved wt shift and fluidity of gait today   Stairs Stairs: Yes Stairs assistance: Min guard;Supervision Stair Management: One rail Right;One rail Left;Step to pattern;Sideways Number of Stairs: 5 General stair comments: cues for sequence and safety   Wheelchair Mobility    Modified Rankin (Stroke Patients Only)       Balance                                            Cognition Arousal/Alertness: Awake/alert Behavior During Therapy: WFL for tasks assessed/performed Overall Cognitive Status: Within Functional Limits for tasks assessed                                         Exercises Total Joint Exercises Ankle Circles/Pumps: AROM;10 reps;Both Quad Sets: Both;10 reps;AROM Gluteal Sets: AROM;Both;10 reps    General Comments        Pertinent Vitals/Pain Pain Assessment: 0-10 Pain Score: 1  Pain Location: Left hip Pain Descriptors / Indicators: Sore;Discomfort Pain Intervention(s): Limited activity within patient's tolerance;Monitored during session;Premedicated before session;Ice applied    Home Living                      Prior Function            PT Goals (current goals can now be found in the care plan section) Acute Rehab PT Goals Patient Stated Goal: to be able to do as much as possible PT Goal Formulation: With patient Time For Goal Achievement: 03/13/19 Potential to Achieve Goals: Good Progress towards PT goals: Progressing toward goals    Frequency    7X/week      PT Plan Current plan remains appropriate    Co-evaluation              AM-PAC PT "6 Clicks" Mobility   Outcome Measure  Help needed turning from your back to your side while in a flat bed without using bedrails?: A Little Help needed moving from lying on your back to sitting on the side of a flat bed without  using bedrails?: A Little Help needed moving to and from a bed to a chair (including a wheelchair)?: A Little Help needed standing up from a chair using your arms (e.g., wheelchair or bedside chair)?: None Help needed to walk in hospital room?: A Little Help needed climbing 3-5 steps with a railing? : A Little 6 Click Score: 19    End of Session Equipment Utilized During Treatment: Gait belt Activity Tolerance: Patient tolerated treatment well Patient left: in chair;with call bell/phone within reach;with chair alarm set   PT Visit Diagnosis: Difficulty in walking, not elsewhere classified (R26.2)     Time: 3009-7949 PT Time Calculation (min) (ACUTE ONLY): 21 min  Charges:  $Gait Training: 8-22 mins                      Kenyon Ana, PT  Pager: [phone] Acute Rehab Dept [facility]): [phone]   03/08/2019    [facility] 03/08/2019, 12:52 PM

## 2019-03-08 NOTE — Plan of Care (Signed)
Pt stable this am with no needs. Pt up in chair. Pt to d/c home once cleared by physical therapy.

## 2019-03-09 ENCOUNTER — Encounter (HOSPITAL_COMMUNITY): Payer: Self-pay | Admitting: Orthopaedic Surgery

## 2019-03-09 ENCOUNTER — Telehealth: Payer: Self-pay | Admitting: Orthopaedic Surgery

## 2019-03-09 NOTE — Telephone Encounter (Signed)
[facility]/PT called needed a verbal order for PT  3x a week for 2 2x week for 1   Please call [facility] [phone]

## 2019-03-09 NOTE — Telephone Encounter (Signed)
Verbal order given  

## 2019-03-11 ENCOUNTER — Telehealth: Payer: Self-pay

## 2019-03-11 NOTE — Telephone Encounter (Signed)
Patient called stating that her left leg is swollen and that her leg is black and blue from the back of her left knee to her groin. Patient had left hip surgery on 03/06/2019.  Would like to know if this is normal?  CB# is [phone]. Please advise.  Thank you.

## 2019-03-11 NOTE — Telephone Encounter (Signed)
Let her know that this can be normal after the extent of surgery that people have on her hips.  It can cause pain radiating into her knee as well as bruising.  This is also from being on blood thinning medication.

## 2019-03-11 NOTE — Telephone Encounter (Signed)
Patient aware of the below message  

## 2019-03-18 ENCOUNTER — Other Ambulatory Visit: Payer: Self-pay | Admitting: Physician Assistant

## 2019-03-18 ENCOUNTER — Telehealth: Payer: Self-pay | Admitting: Orthopaedic Surgery

## 2019-03-18 MED ORDER — TIZANIDINE HCL 2 MG PO TABS: 2.0000 mg | ORAL_TABLET | Freq: Three times a day (TID) | ORAL | 0 refills | Status: DC | PRN

## 2019-03-18 MED ORDER — TRAMADOL HCL 50 MG PO TABS: 50.0000 mg | ORAL_TABLET | Freq: Four times a day (QID) | ORAL | 0 refills | Status: DC | PRN

## 2019-03-18 NOTE — Telephone Encounter (Signed)
Please advise  03/06/19 left hip replacement

## 2019-03-18 NOTE — Telephone Encounter (Signed)
Patient called requesting an RX refill on her Tizanidine and Tramadol.  Patient uses [facility] on [address].  CB#[phone].  Thank you.

## 2019-03-18 NOTE — Telephone Encounter (Signed)
Done

## 2019-03-23 ENCOUNTER — Encounter: Payer: Self-pay | Admitting: Physician Assistant

## 2019-03-23 ENCOUNTER — Other Ambulatory Visit: Payer: Self-pay

## 2019-03-23 ENCOUNTER — Ambulatory Visit (INDEPENDENT_AMBULATORY_CARE_PROVIDER_SITE_OTHER): Payer: Medicare Other | Admitting: Physician Assistant

## 2019-03-23 VITALS — Ht 66.0 in | Wt 139.0 lb

## 2019-03-23 DIAGNOSIS — Z96642 Presence of left artificial hip joint: Secondary | ICD-10-CM

## 2019-03-23 NOTE — Progress Notes (Signed)
HPI: Rebecca [name] returns today 2 weeks status post left total hip arthroplasty.  She is overall doing well.  She working with physical therapy at home.  She states she does not have to use a cane but she walks with it "show".  She has had no shortness of breath, fevers  or chest pain.  Physical exam: Left hip surgical incisions healing well no signs of infection.  Subcu stitch well approximate skin.  Good range of motion of the left hip without significant pain.  Left calf supple nontender dorsiflexion plantarflexion ankle intact.  Impression: 2-week status post left total hip arthroplasty.  Plan: She is able to get the incision wet in shower.  New Steri-Strips applied.  She will follow-up if there is any questions about the incisions or signs of infection.  She will continue to work with therapy on range of motion strengthening.  Follow-up with [place] in 1 month sooner if there is any  questions.

## 2019-04-27 ENCOUNTER — Other Ambulatory Visit: Payer: Self-pay

## 2019-04-27 ENCOUNTER — Ambulatory Visit (INDEPENDENT_AMBULATORY_CARE_PROVIDER_SITE_OTHER): Payer: Medicare Other | Admitting: Physician Assistant

## 2019-04-27 ENCOUNTER — Encounter: Payer: Self-pay | Admitting: Physician Assistant

## 2019-04-27 VITALS — Ht 66.0 in | Wt 134.0 lb

## 2019-04-27 DIAGNOSIS — Z96642 Presence of left artificial hip joint: Secondary | ICD-10-CM

## 2019-04-27 NOTE — Progress Notes (Addendum)
HPI: Rebecca [name] returns today 7 weeks status post left total hip arthroplasty.  She is overall doing great.  She has no complaints.  States that she is very happy with the results she had no pain.  She is ambulating without any assistive device.  Physical exam: Left hip excellent range of motion without pain.  Left calf supple nontender dorsiflexion plantarflexion left ankle intact.  Impression: 7-week status post left total hip arthroplasty  Plan: She will follow-up with [place] in July 2021 and at that time we will obtain an AP pelvis and lateral view of her left hip.  Questions encouraged and answered at length.  She follow-up with [place] sooner if there is any questions or concerns.

## 2019-12-09 DIAGNOSIS — E038 Other specified hypothyroidism: Secondary | ICD-10-CM | POA: Diagnosis not present

## 2019-12-09 DIAGNOSIS — R7301 Impaired fasting glucose: Secondary | ICD-10-CM | POA: Diagnosis not present

## 2019-12-09 DIAGNOSIS — M8589 Other specified disorders of bone density and structure, multiple sites: Secondary | ICD-10-CM | POA: Diagnosis not present

## 2019-12-09 DIAGNOSIS — Z0000 Encounter for general adult medical examination without abnormal findings: Secondary | ICD-10-CM | POA: Diagnosis not present

## 2019-12-09 DIAGNOSIS — M859 Disorder of bone density and structure, unspecified: Secondary | ICD-10-CM | POA: Diagnosis not present

## 2019-12-09 DIAGNOSIS — E7849 Other hyperlipidemia: Secondary | ICD-10-CM | POA: Diagnosis not present

## 2019-12-15 DIAGNOSIS — R82998 Other abnormal findings in urine: Secondary | ICD-10-CM | POA: Diagnosis not present

## 2019-12-15 DIAGNOSIS — E7849 Other hyperlipidemia: Secondary | ICD-10-CM | POA: Diagnosis not present

## 2019-12-16 DIAGNOSIS — Z0000 Encounter for general adult medical examination without abnormal findings: Secondary | ICD-10-CM | POA: Diagnosis not present

## 2019-12-16 DIAGNOSIS — R7301 Impaired fasting glucose: Secondary | ICD-10-CM | POA: Diagnosis not present

## 2019-12-16 DIAGNOSIS — M8580 Other specified disorders of bone density and structure, unspecified site: Secondary | ICD-10-CM | POA: Diagnosis not present

## 2019-12-16 DIAGNOSIS — R030 Elevated blood-pressure reading, without diagnosis of hypertension: Secondary | ICD-10-CM | POA: Diagnosis not present

## 2019-12-16 DIAGNOSIS — E039 Hypothyroidism, unspecified: Secondary | ICD-10-CM | POA: Diagnosis not present

## 2019-12-16 DIAGNOSIS — Z1339 Encounter for screening examination for other mental health and behavioral disorders: Secondary | ICD-10-CM | POA: Diagnosis not present

## 2019-12-16 DIAGNOSIS — Z853 Personal history of malignant neoplasm of breast: Secondary | ICD-10-CM | POA: Diagnosis not present

## 2019-12-16 DIAGNOSIS — Z1331 Encounter for screening for depression: Secondary | ICD-10-CM | POA: Diagnosis not present

## 2019-12-16 DIAGNOSIS — E785 Hyperlipidemia, unspecified: Secondary | ICD-10-CM | POA: Diagnosis not present

## 2020-01-13 ENCOUNTER — Other Ambulatory Visit: Payer: Self-pay | Admitting: Internal Medicine

## 2020-01-13 DIAGNOSIS — Z1231 Encounter for screening mammogram for malignant neoplasm of breast: Secondary | ICD-10-CM

## 2020-01-22 DIAGNOSIS — B354 Tinea corporis: Secondary | ICD-10-CM | POA: Diagnosis not present

## 2020-02-10 ENCOUNTER — Ambulatory Visit: Payer: Medicare Other

## 2020-02-17 ENCOUNTER — Ambulatory Visit
Admission: RE | Admit: 2020-02-17 | Discharge: 2020-02-17 | Disposition: A | Discharge status: Home / Self Care | Payer: Medicare PPO | Source: Ambulatory Visit | Attending: Internal Medicine | Admitting: Internal Medicine

## 2020-02-17 ENCOUNTER — Other Ambulatory Visit: Payer: Self-pay

## 2020-02-17 DIAGNOSIS — Z1231 Encounter for screening mammogram for malignant neoplasm of breast: Secondary | ICD-10-CM | POA: Diagnosis not present

## 2020-02-22 ENCOUNTER — Ambulatory Visit: Payer: Medicare Other | Admitting: Physician Assistant

## 2020-08-13 DIAGNOSIS — I10 Essential (primary) hypertension: Secondary | ICD-10-CM

## 2020-08-13 HISTORY — DX: Essential (primary) hypertension: I10

## 2021-01-04 DIAGNOSIS — M859 Disorder of bone density and structure, unspecified: Secondary | ICD-10-CM | POA: Diagnosis not present

## 2021-01-04 DIAGNOSIS — E039 Hypothyroidism, unspecified: Secondary | ICD-10-CM | POA: Diagnosis not present

## 2021-01-04 DIAGNOSIS — E785 Hyperlipidemia, unspecified: Secondary | ICD-10-CM | POA: Diagnosis not present

## 2021-01-04 DIAGNOSIS — R7301 Impaired fasting glucose: Secondary | ICD-10-CM | POA: Diagnosis not present

## 2021-01-11 ENCOUNTER — Other Ambulatory Visit: Payer: Self-pay | Admitting: Internal Medicine

## 2021-01-11 DIAGNOSIS — G5601 Carpal tunnel syndrome, right upper limb: Secondary | ICD-10-CM | POA: Diagnosis not present

## 2021-01-11 DIAGNOSIS — E039 Hypothyroidism, unspecified: Secondary | ICD-10-CM | POA: Diagnosis not present

## 2021-01-11 DIAGNOSIS — M8580 Other specified disorders of bone density and structure, unspecified site: Secondary | ICD-10-CM | POA: Diagnosis not present

## 2021-01-11 DIAGNOSIS — Z1331 Encounter for screening for depression: Secondary | ICD-10-CM | POA: Diagnosis not present

## 2021-01-11 DIAGNOSIS — E785 Hyperlipidemia, unspecified: Secondary | ICD-10-CM | POA: Diagnosis not present

## 2021-01-11 DIAGNOSIS — Z1339 Encounter for screening examination for other mental health and behavioral disorders: Secondary | ICD-10-CM | POA: Diagnosis not present

## 2021-01-11 DIAGNOSIS — R82998 Other abnormal findings in urine: Secondary | ICD-10-CM | POA: Diagnosis not present

## 2021-01-11 DIAGNOSIS — R7301 Impaired fasting glucose: Secondary | ICD-10-CM | POA: Diagnosis not present

## 2021-01-11 DIAGNOSIS — Z0000 Encounter for general adult medical examination without abnormal findings: Secondary | ICD-10-CM | POA: Diagnosis not present

## 2021-01-11 DIAGNOSIS — R030 Elevated blood-pressure reading, without diagnosis of hypertension: Secondary | ICD-10-CM | POA: Diagnosis not present

## 2021-01-11 DIAGNOSIS — Z1231 Encounter for screening mammogram for malignant neoplasm of breast: Secondary | ICD-10-CM

## 2021-01-19 DIAGNOSIS — R030 Elevated blood-pressure reading, without diagnosis of hypertension: Secondary | ICD-10-CM | POA: Diagnosis not present

## 2021-01-26 DIAGNOSIS — R030 Elevated blood-pressure reading, without diagnosis of hypertension: Secondary | ICD-10-CM | POA: Diagnosis not present

## 2021-03-07 DIAGNOSIS — E785 Hyperlipidemia, unspecified: Secondary | ICD-10-CM | POA: Diagnosis not present

## 2021-03-09 ENCOUNTER — Ambulatory Visit
Admission: RE | Admit: 2021-03-09 | Discharge: 2021-03-09 | Disposition: A | Discharge status: Home / Self Care | Payer: Medicare PPO | Source: Ambulatory Visit | Attending: Internal Medicine | Admitting: Internal Medicine

## 2021-03-09 ENCOUNTER — Other Ambulatory Visit: Payer: Self-pay

## 2021-03-09 DIAGNOSIS — Z1231 Encounter for screening mammogram for malignant neoplasm of breast: Secondary | ICD-10-CM | POA: Diagnosis not present

## 2021-03-13 ENCOUNTER — Other Ambulatory Visit: Payer: Self-pay | Admitting: Internal Medicine

## 2021-03-13 DIAGNOSIS — R928 Other abnormal and inconclusive findings on diagnostic imaging of breast: Secondary | ICD-10-CM

## 2021-03-20 ENCOUNTER — Ambulatory Visit
Admission: RE | Admit: 2021-03-20 | Discharge: 2021-03-20 | Disposition: A | Discharge status: Home / Self Care | Payer: Medicare PPO | Source: Ambulatory Visit | Attending: Internal Medicine | Admitting: Internal Medicine

## 2021-03-20 ENCOUNTER — Other Ambulatory Visit: Payer: Self-pay

## 2021-03-20 ENCOUNTER — Other Ambulatory Visit: Payer: Self-pay | Admitting: Internal Medicine

## 2021-03-20 DIAGNOSIS — R928 Other abnormal and inconclusive findings on diagnostic imaging of breast: Secondary | ICD-10-CM

## 2021-03-20 DIAGNOSIS — R922 Inconclusive mammogram: Secondary | ICD-10-CM | POA: Diagnosis not present

## 2021-03-23 ENCOUNTER — Other Ambulatory Visit: Payer: Self-pay

## 2021-03-23 ENCOUNTER — Ambulatory Visit
Admission: RE | Admit: 2021-03-23 | Discharge: 2021-03-23 | Disposition: A | Discharge status: Home / Self Care | Payer: Medicare PPO | Source: Ambulatory Visit | Attending: Internal Medicine | Admitting: Internal Medicine

## 2021-03-23 ENCOUNTER — Other Ambulatory Visit: Payer: Self-pay | Admitting: Internal Medicine

## 2021-03-23 DIAGNOSIS — C50411 Malignant neoplasm of upper-outer quadrant of right female breast: Secondary | ICD-10-CM | POA: Diagnosis not present

## 2021-03-23 DIAGNOSIS — R928 Other abnormal and inconclusive findings on diagnostic imaging of breast: Secondary | ICD-10-CM

## 2021-03-23 DIAGNOSIS — N6311 Unspecified lump in the right breast, upper outer quadrant: Secondary | ICD-10-CM | POA: Diagnosis not present

## 2021-03-23 DIAGNOSIS — Z170 Estrogen receptor positive status [ER+]: Secondary | ICD-10-CM | POA: Diagnosis not present

## 2021-03-24 ENCOUNTER — Other Ambulatory Visit: Payer: Self-pay | Admitting: Internal Medicine

## 2021-03-24 DIAGNOSIS — C50911 Malignant neoplasm of unspecified site of right female breast: Secondary | ICD-10-CM

## 2021-03-27 ENCOUNTER — Other Ambulatory Visit: Payer: Medicare PPO

## 2021-03-28 ENCOUNTER — Ambulatory Visit
Admission: RE | Admit: 2021-03-28 | Discharge: 2021-03-28 | Disposition: A | Discharge status: Home / Self Care | Payer: Medicare PPO | Source: Ambulatory Visit | Attending: Internal Medicine | Admitting: Internal Medicine

## 2021-03-28 DIAGNOSIS — C50911 Malignant neoplasm of unspecified site of right female breast: Secondary | ICD-10-CM

## 2021-03-28 DIAGNOSIS — Z01818 Encounter for other preprocedural examination: Secondary | ICD-10-CM | POA: Diagnosis not present

## 2021-03-28 MED ORDER — GADOBUTROL 1 MMOL/ML IV SOLN
6.0000 mL | Freq: Once | INTRAVENOUS | Status: AC | PRN
  Administered 2021-03-28: 6 mL via INTRAVENOUS

## 2021-03-30 ENCOUNTER — Other Ambulatory Visit: Payer: Medicare PPO

## 2021-03-31 ENCOUNTER — Other Ambulatory Visit: Payer: Self-pay | Admitting: General Surgery

## 2021-03-31 DIAGNOSIS — R928 Other abnormal and inconclusive findings on diagnostic imaging of breast: Secondary | ICD-10-CM | POA: Diagnosis not present

## 2021-03-31 DIAGNOSIS — Z170 Estrogen receptor positive status [ER+]: Secondary | ICD-10-CM

## 2021-03-31 DIAGNOSIS — C50411 Malignant neoplasm of upper-outer quadrant of right female breast: Secondary | ICD-10-CM | POA: Diagnosis not present

## 2021-03-31 DIAGNOSIS — Z853 Personal history of malignant neoplasm of breast: Secondary | ICD-10-CM | POA: Diagnosis not present

## 2021-04-01 ENCOUNTER — Other Ambulatory Visit: Payer: Self-pay | Admitting: General Surgery

## 2021-04-01 DIAGNOSIS — R928 Other abnormal and inconclusive findings on diagnostic imaging of breast: Secondary | ICD-10-CM

## 2021-04-03 ENCOUNTER — Other Ambulatory Visit: Payer: Self-pay | Admitting: General Surgery

## 2021-04-03 DIAGNOSIS — Z170 Estrogen receptor positive status [ER+]: Secondary | ICD-10-CM

## 2021-04-05 ENCOUNTER — Other Ambulatory Visit: Payer: Self-pay | Admitting: *Deleted

## 2021-04-05 DIAGNOSIS — Z170 Estrogen receptor positive status [ER+]: Secondary | ICD-10-CM

## 2021-04-05 DIAGNOSIS — C50411 Malignant neoplasm of upper-outer quadrant of right female breast: Secondary | ICD-10-CM

## 2021-04-11 ENCOUNTER — Telehealth: Payer: Self-pay | Admitting: Hematology and Oncology

## 2021-04-11 NOTE — Telephone Encounter (Signed)
Scheduled appt per RN Varney Biles. Pt is aware of appt date and time.

## 2021-04-13 ENCOUNTER — Other Ambulatory Visit: Payer: Self-pay | Admitting: Body Imaging

## 2021-04-13 ENCOUNTER — Other Ambulatory Visit: Payer: Self-pay

## 2021-04-13 ENCOUNTER — Telehealth: Payer: Self-pay | Admitting: Radiation Oncology

## 2021-04-13 ENCOUNTER — Ambulatory Visit
Admission: RE | Admit: 2021-04-13 | Discharge: 2021-04-13 | Disposition: A | Discharge status: Home / Self Care | Payer: Medicare PPO | Source: Ambulatory Visit | Attending: General Surgery | Admitting: General Surgery

## 2021-04-13 ENCOUNTER — Telehealth: Payer: Self-pay | Admitting: Oncology

## 2021-04-13 ENCOUNTER — Other Ambulatory Visit: Payer: Self-pay | Admitting: General Surgery

## 2021-04-13 DIAGNOSIS — R928 Other abnormal and inconclusive findings on diagnostic imaging of breast: Secondary | ICD-10-CM | POA: Diagnosis not present

## 2021-04-13 DIAGNOSIS — N6031 Fibrosclerosis of right breast: Secondary | ICD-10-CM | POA: Diagnosis not present

## 2021-04-13 DIAGNOSIS — N6011 Diffuse cystic mastopathy of right breast: Secondary | ICD-10-CM | POA: Diagnosis not present

## 2021-04-13 MED ORDER — GADOBUTROL 1 MMOL/ML IV SOLN
5.0000 mL | Freq: Once | INTRAVENOUS | Status: AC | PRN
  Administered 2021-04-13: 5 mL via INTRAVENOUS

## 2021-04-13 NOTE — Telephone Encounter (Signed)
Pt had called and requested to see Dr. Jana Hakim. Moved appt to first available on Dr. Virgie Dad schedule. Okay to [place] per [employer]. Pt is aware of change.

## 2021-04-18 NOTE — Progress Notes (Signed)
Surgical Instructions    Your procedure is scheduled on Friday September 16.  Report to [facility] Main Entrance "A" at 7 A.M., then check in with the Admitting office.  Call this number if you have problems the morning of surgery:  [phone]   If you have any questions prior to your surgery date call [phone]: Open Monday-Friday 8am-4pm    Remember:  Do not eat after midnight the night before your surgery  You may drink clear liquids until 6am the morning of your surgery.   Clear liquids allowed are: Water, Non-Citrus Juices (without pulp), Carbonated Beverages, Clear Tea, Black Coffee ONLY (NO MILK, CREAM OR POWDERED CREAMER of any kind), and Gatorade    Take these medicines the morning of surgery with A SIP OF WATER :            levothyroxine (SYNTHROID)             omeprazole (PRILOSEC)   As of today, STOP taking any Aspirin (unless otherwise instructed by your surgeon) Aleve, Naproxen, Ibuprofen, Motrin, Advil, Goody's, BC's, all herbal medications, fish oil, and all vitamins.          Do not wear jewelry or makeup Do not wear lotions, powders, perfumes/colognes, or deodorant. Do not shave 48 hours prior to surgery.  Men may shave face and neck. Do not bring valuables to the hospital. DO Not wear nail polish, gel polish, artificial nails, or any other type of covering on  natural nails including finger and toenails. If patients have artificial nails, gel coating, etc. that need to be removed by a nail salon please have this removed prior to surgery or surgery may need to be canceled/delayed if the surgeon/ anesthesia feels like the patient is unable to be adequately monitored.             [facility] is not responsible for any belongings or valuables.  Do NOT Smoke (Tobacco/Vaping)  24 hours prior to your procedure If you use a CPAP at night, you may bring all equipment for your overnight stay.   Contacts, glasses, dentures or bridgework may not be worn into surgery,  please bring cases for these belongings   For patients admitted to the hospital, discharge time will be determined by your treatment team.   Patients discharged the day of surgery will not be allowed to drive home, and someone needs to stay with them for 24 hours.  ONLY 1 SUPPORT PERSON MAY BE PRESENT WHILE YOU ARE IN SURGERY. IF YOU ARE TO BE ADMITTED ONCE YOU ARE IN YOUR ROOM YOU WILL BE ALLOWED TWO (2) VISITORS.  Minor children may have two parents present. Special consideration for safety and communication needs will be reviewed on a case by case basis.  Special instructions:    Oral Hygiene is also important to reduce your risk of infection.  Remember - BRUSH YOUR TEETH THE MORNING OF SURGERY WITH YOUR REGULAR TOOTHPASTE   - Preparing For Surgery  Before surgery, you can play an important role. Because skin is not sterile, your skin needs to be as free of germs as possible. You can reduce the number of germs on your skin by washing with CHG (chlorahexidine gluconate) Soap before surgery.  CHG is an antiseptic cleaner which kills germs and bonds with the skin to continue killing germs even after washing.     Please do not use if you have an allergy to CHG or antibacterial soaps. If your skin becomes reddened/irritated stop  using the CHG.  Do not shave (including legs and underarms) for at least 48 hours prior to first CHG shower. It is OK to shave your face.  Please follow these instructions carefully.     Shower the NIGHT BEFORE SURGERY and the MORNING OF SURGERY with CHG Soap.   If you chose to wash your hair, wash your hair first as usual with your normal shampoo. After you shampoo, rinse your hair and body thoroughly to remove the shampoo.  Then [employer] and genitals (private parts) with your normal soap and rinse thoroughly to remove soap.  After that Use CHG Soap as you would any other liquid soap. You can apply CHG directly to the skin and wash gently with a scrungie  or a clean washcloth.   Apply the CHG Soap to your body ONLY FROM THE NECK DOWN.  Do not use on open wounds or open sores. Avoid contact with your eyes, ears, mouth and genitals (private parts). Wash Face and genitals (private parts)  with your normal soap.   Wash thoroughly, paying special attention to the area where your surgery will be performed.  Thoroughly rinse your body with warm water from the neck down.  DO NOT shower/wash with your normal soap after using and rinsing off the CHG Soap.  Pat yourself dry with a CLEAN TOWEL.  Wear CLEAN PAJAMAS to bed the night before surgery  Place CLEAN SHEETS on your bed the night before your surgery  DO NOT SLEEP WITH PETS.   Day of Surgery:  Take a shower with CHG soap. Wear Clean/Comfortable clothing the morning of surgery Do not apply any deodorants/lotions.   Remember to brush your teeth WITH YOUR REGULAR TOOTHPASTE.   Please read over the following fact sheets that you were given.

## 2021-04-19 ENCOUNTER — Other Ambulatory Visit: Payer: Self-pay

## 2021-04-19 ENCOUNTER — Encounter (HOSPITAL_COMMUNITY): Payer: Self-pay

## 2021-04-19 ENCOUNTER — Encounter (HOSPITAL_COMMUNITY)
Admission: RE | Admit: 2021-04-19 | Discharge: 2021-04-19 | Disposition: A | Discharge status: Home / Self Care | Payer: Medicare PPO | Source: Ambulatory Visit | Attending: General Surgery | Admitting: General Surgery

## 2021-04-19 DIAGNOSIS — Z01818 Encounter for other preprocedural examination: Secondary | ICD-10-CM | POA: Diagnosis not present

## 2021-04-19 LAB — BASIC METABOLIC PANEL
Anion gap: 6 (ref 5–15)
BUN: 30 mg/dL — ABNORMAL HIGH (ref 8–23)
CO2: 26 mmol/L (ref 22–32)
Calcium: 9.2 mg/dL (ref 8.9–10.3)
Chloride: 107 mmol/L (ref 98–111)
Creatinine, Ser: 0.78 mg/dL (ref 0.44–1.00)
GFR, Estimated: >60 mL/min (ref >60)
Glucose, Bld: 98 mg/dL (ref 70–99)
Potassium: 4 mmol/L (ref 3.5–5.1)
Sodium: 139 mmol/L (ref 135–145)

## 2021-04-19 LAB — CBC
HCT: 38.8 % (ref 36.0–46.0)
Hemoglobin: 12.6 g/dL (ref 12.0–15.0)
MCH: 30.5 pg (ref 26.0–34.0)
MCHC: 32.5 g/dL (ref 30.0–36.0)
MCV: 93.9 fL (ref 80.0–100.0)
Platelets: 284 10*3/uL (ref 150–400)
RBC: 4.13 MIL/uL (ref 3.87–5.11)
RDW: 13.2 % (ref 11.5–15.5)
WBC: 5.4 10*3/uL (ref 4.0–10.5)
nRBC: 0 % (ref 0.0–0.2)

## 2021-04-19 NOTE — Progress Notes (Signed)
PCP - Dr. Lendon Ka. Carlynn Herald. Cardiologist - denies  PPM/ICD - denies   Chest x-ray - 07/08/06 EKG - 04/19/21, at PAT appt Stress Test - denies ECHO - denies Cardiac Cath - denies  Sleep Study - denies   DM: denies  Blood Thinner Instructions: n/a Aspirin Instructions: n/a  ERAS Protcol - yes, no drink   COVID TEST- n/a, ambulatory surgery   Anesthesia review: no  Patient denies shortness of breath, fever, cough and chest pain at PAT appointment   All instructions explained to the patient, with a verbal understanding of the material. Patient agrees to go over the instructions while at home for a better understanding. The opportunity to ask questions was provided.

## 2021-04-20 ENCOUNTER — Ambulatory Visit: Payer: Medicare PPO | Admitting: Hematology and Oncology

## 2021-04-21 ENCOUNTER — Encounter: Payer: Self-pay | Admitting: *Deleted

## 2021-04-21 NOTE — Progress Notes (Signed)
Location of Breast Cancer:upper-outer quadrant of right breast  Histology per Pathology Report:  03/23/2021 Diagnosis Breast, right, needle core biopsy, upper outer, 10 o'clock - INVASIVE MAMMARY CARCINOMA, GRADE 1/2. - MAMMARY CARCINOMA IN SITU.  Receptor Status:  The tumor cells are EQUIVOCAL for Her2 (2+). Her2 by FISH will be performed and results reported separately. Estrogen Receptor: 100%, POSITIVE, STRONG STAINING INTENSITY Progesterone Receptor: 95%, POSITIVE, STRONG STAINING INTENSITY Proliferation Marker Ki67: 1%  Did patient present with symptoms (if so, please note symptoms) or was this found on screening mammography?: presented with a palpable mass  Past/Anticipated interventions by surgeon, if any: Dr Barry Dienes Right Breast Radioactive seed bracketed lumpectomy and sentinel lymph node biopsy  Past/Anticipated interventions by medical oncology, if any:  previous breast cancer in 2004 on the left that was treated with breast conservation, radiation, and tamoxifen. Dr. Annamaria Boots was her surgeon. She did not receive chemotherapy at the time but it was offered.  does not meet criteria for anti-HER-2 immunotherapy, will receive antiestrogens as her systemic treatment   Lymphedema issues, if any:  no    Pain issues, if any:  no   SAFETY ISSUES: Prior radiation? yes, left breast 2004 Pacemaker/ICD? no Possible current pregnancy?no, postmenopausal Is the patient on methotrexate? no  Current Complaints / other details:  none    Vitals:   05/01/21 0806  BP: 137/71  Pulse: 67  Resp: 18  Temp: (!) 96.8 F (36 C)  TempSrc: Temporal  SpO2: 100%  Weight: 128 lb 9.6 oz (58.3 kg)  Height: _0  (1.676 m)

## 2021-04-25 NOTE — Progress Notes (Signed)
[place]  [phone] Fax:([phone]     ID: ARAMINTA ZORN DOB: 06-23-1941  MR#: [id number]  HWT#:[id number]  Patient Care Team: Ginger Organ., MD as PCP - General (Internal Medicine) Mauro Kaufmann, RN as Oncology Nurse Navigator Rockwell Germany, RN as Oncology Nurse Navigator Stark Klein, MD as Consulting Physician (General Surgery) Jazmon Kos, Virgie Dad, MD as Consulting Physician (Oncology) Gery Pray, MD as Consulting Physician (Radiation Oncology) Chauncey Cruel, MD OTHER MD:  CHIEF COMPLAINT: Estrogen receptor positive breast cancer  CURRENT TREATMENT: Awaiting definitive surgery   HISTORY OF CURRENT ILLNESS: PUNEET SELDEN has a history of stage T1c left breast cancer, status post lumpectomy 06/2006, radiation therapy, and 5 years of Femara under the care of Dr. Truddie Coco.  More recently, she had routine screening mammography on 03/09/2021 showing a possible abnormality in the right breast. She underwent right diagnostic mammography with tomography and right breast ultrasonography at [facility] on 03/20/2021 showing: breast density category C; 2.2 cm irregular right breast mass at 10 o'clock; no right axillary adenopathy.  Accordingly on 03/23/2021 she proceeded to biopsy of the right breast area in question. The pathology from this procedure ([id number]) showed: invasive and in situ mammary carcinoma, e-cadherin positive, grade 1/2. Prognostic indicators significant for: estrogen receptor, 100% positive and progesterone receptor, 95% positive, both with strong staining intensity. Proliferation marker Ki67 at 1%. HER2 equivocal by immunohistochemistry (2+), but negative by fluorescent in situ hybridization with a signals ratio 1.27 and number per cell 1.65.  She underwent breast MRI on 03/28/2021 showing: breast composition C; 1.5 cm biopsy-proven malignancy in upper-outer right breast; indeterminate 1.2 cm focal non-mass enhancement  in lower-outer right breast; no evidence of malignancy in left breast; no suspicious lymphadenopathy.  She proceeded to additional right breast biopsies on 04/13/2021. Pathology [id number]) showed: 1. Right Breast, lower outer  - increased stromal fibrosis with fibrocystic changes, duct ectasia and calcifications 2. Right Breast, lateral mid depth  - stromal fibrosis with lobular neoplasia and adenosis  She is scheduled for definitive surgery on 04/28/2021.  Cancer Staging Malignant neoplasm of upper-outer quadrant of right breast in female, estrogen receptor positive ([place]) Staging form: Breast, AJCC 8th Edition - Clinical: Stage IA (cT1c, cN0, cM0, G1, ER+, PR+, HER2-) - Signed by Chauncey Cruel, MD on 04/27/2021 Histologic grading system: 3 grade system  The patient's subsequent history is as detailed below.   INTERVAL HISTORY: Aiana was evaluated in the breast cancer clinic on 04/26/2021 accompanied by her sister Jossie Ng, who is an MD specializing in maternal-fetal medicine. Her case was also presented at the multidisciplinary breast cancer conference on 03/29/2021 at that time a preliminary plan was proposed: Lumpectomy without sentinel lymph node sampling, consideration of adjuvant radiation, antiestrogens, and genetics   REVIEW OF SYSTEMS: There were no specific symptoms leading to the original mammogram, which was routinely scheduled. The patient denies unusual headaches, visual changes, nausea, vomiting, stiff neck, dizziness, or gait imbalance. There has been no cough, phlegm production, or pleurisy, no chest pain or pressure, and no change in bowel or bladder habits. The patient denies fever, rash, bleeding, unexplained fatigue or unexplained weight loss.  She likes to walk for exercise.  A detailed review of systems was otherwise entirely negative.   COVID 19 VACCINATION STATUS: Status post Pfizer x4 as of September 2020   PAST MEDICAL HISTORY: Past Medical History:   Diagnosis Date   Anemia    Breast CA ([place])  Left   GERD (gastroesophageal reflux disease)    History of cataract    History of hiatal hernia    Hypertension 2022   Hypothyroidism    OA (osteoarthritis)    Personal history of radiation therapy    PONV (postoperative nausea and vomiting)    Vitamin D deficiency     PAST SURGICAL HISTORY: Past Surgical History:  Procedure Laterality Date   BREAST EXCISIONAL BIOPSY Left    BREAST LUMPECTOMY Left    CATARACT EXTRACTION, BILATERAL     COLONOSCOPY  01/03/2007   TOTAL HIP ARTHROPLASTY Left 03/06/2019   Procedure: LEFT TOTAL HIP ARTHROPLASTY ANTERIOR APPROACH;  Surgeon: Mcarthur Rossetti, MD;  Location: [facility];  Service: Orthopedics;  Laterality: Left;   UPPER GI ENDOSCOPY  09/23/2007    FAMILY HISTORY: Family History  Problem Relation Age of Onset   Breast cancer Sister   The patient's father had a history of renal cancer, subsequently dying in his 45s from other causes.  The patient's mother also died at an advanced age.  1 of 2 maternal aunts had bladder cancer and 1 maternal cousin had breast cancer.  The patient has 1 brother, 4 sisters.  1 sister died from renal cancer, 1 sister [name]) has a history of breast cancer   GYNECOLOGIC HISTORY:  No LMP recorded. Patient is postmenopausal. Menarche: 80 years old [place] P 0 LMP 48 HRT more than 10 yearsHysterectomy? no BSO? 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  Her husband [name] worked for [employer].  Her (adopted) children are [name], 55, who lives in [place] is a Pharmacist, hospital in middle school and has 5 children; and [name], who has ADHD and bipolar issues is age 6 and lives with the patient.  Karolynn attends [employer]    ADVANCED DIRECTIVES: In the absence of any documentation to the contrary, the patient's spouse is their HCPOA.   HEALTH MAINTENANCE: Social  History   Tobacco Use   Smoking status: Never   Smokeless tobacco: Never  Vaping Use   Vaping Use: Never used  Substance Use Topics   Alcohol use: Yes    Comment: maybe once a month   Drug use: Never     No Known Allergies  Current Outpatient Medications  Medication Sig Dispense Refill   B Complex-C (SUPER B COMPLEX PO) Take 1 tablet by mouth daily.     calcium carbonate (TUMS - DOSED IN MG ELEMENTAL CALCIUM) 500 MG chewable tablet Chew 2 tablets by mouth 3 (three) times daily as needed for indigestion or heartburn.     cholecalciferol (VITAMIN D3) 25 MCG (1000 UT) tablet Take 1,000 Units by mouth daily.     levothyroxine (SYNTHROID) 75 MCG tablet Take 75 mcg by mouth daily before breakfast.      magnesium oxide (MAG-OX) 400 MG tablet Take 400 mg by mouth daily as needed (cramps).     olmesartan (BENICAR) 20 MG tablet Take 20 mg by mouth daily.     POLY-IRON 150 150 MG capsule Take 150 mg by mouth daily.      omeprazole (PRILOSEC) 20 MG capsule Take 20 mg by mouth daily as needed (acid reflux). (Patient not taking: Reported on 04/26/2021)     No current facility-administered medications for this visit.    OBJECTIVE: White woman in no acute distress  Vitals:   04/26/21 1648  BP: (!) 153/73  Pulse:  73  Resp: 18  Temp: 97.9 F (36.6 C)  SpO2: 100%     Body mass index is 20.3 kg/m.   Wt Readings from Last 3 Encounters:  04/26/21 129 lb 9.6 oz (58.8 kg)  04/19/21 132 lb 3.2 oz (60 kg)  04/27/19 134 lb (60.8 kg)      ECOG FS:1 - Symptomatic but completely ambulatory  Ocular: Sclerae unicteric, pupils round and equal Ear-nose-throat: Wearing a mask Lymphatic: No cervical or supraclavicular adenopathy Lungs no rales or rhonchi Heart regular rate and rhythm Abd soft, nontender, positive bowel sounds MSK no focal spinal tenderness, no joint edema Neuro: non-focal, well-oriented, appropriate affect Breasts:    LAB RESULTS:  CMP     Component Value Date/Time   NA  139 04/19/2021 1000   K 4.0 04/19/2021 1000   CL 107 04/19/2021 1000   CO2 26 04/19/2021 1000   GLUCOSE 98 04/19/2021 1000   BUN 30 (H) 04/19/2021 1000   CREATININE 0.78 04/19/2021 1000   CALCIUM 9.2 04/19/2021 1000   PROT 6.5 07/10/2011 1526   ALBUMIN 4.2 07/10/2011 1526   AST 35 07/10/2011 1526   ALT 30 07/10/2011 1526   ALKPHOS 79 07/10/2011 1526   BILITOT 0.3 07/10/2011 1526   GFRNONAA >60 04/19/2021 1000   GFRAA >60 03/07/2019 0318    No results found for: TOTALPROTELP, ALBUMINELP, A1GS, A2GS, BETS, BETA2SER, GAMS, MSPIKE, SPEI  Lab Results  Component Value Date   WBC 5.4 04/19/2021   NEUTROABS 4.3 07/10/2011   HGB 12.6 04/19/2021   HCT 38.8 04/19/2021   MCV 93.9 04/19/2021   PLT 284 04/19/2021    Lab Results  Component Value Date   LABCA2 16 12/05/2010    No components found for: OXBDZH299  No results for input(s): INR in the last 168 hours.  Lab Results  Component Value Date   LABCA2 16 12/05/2010    No results found for: MEQ683  No results found for: MHD622  No results found for: WLN989  No results found for: CA2729  No components found for: HGQUANT  No results found for: CEA1 / No results found for: CEA1   No results found for: AFPTUMOR  No results found for: CHROMOGRNA  No results found for: KPAFRELGTCHN, LAMBDASER, KAPLAMBRATIO (kappa/lambda light chains)  No results found for: HGBA, HGBA2QUANT, HGBFQUANT, HGBSQUAN (Hemoglobinopathy evaluation)   Lab Results  Component Value Date   LDH 175 06/05/2010    No results found for: IRON, TIBC, IRONPCTSAT (Iron and TIBC)  No results found for: FERRITIN  Urinalysis No results found for: COLORURINE, APPEARANCEUR, LABSPEC, PHURINE, GLUCOSEU, HGBUR, BILIRUBINUR, KETONESUR, PROTEINUR, UROBILINOGEN, NITRITE, LEUKOCYTESUR   STUDIES: MM RT RADIOACTIVE SEED LOC MAMMO GUIDE  Result Date: 04/27/2021 CLINICAL DATA:  Localization of the ribbon shaped clip and the dumbbell shaped clip prior to  surgery. EXAM: MAMMOGRAPHIC GUIDED RADIOACTIVE SEED LOCALIZATION OF THE RIGHT BREAST COMPARISON:  Previous exam(s). FINDINGS: Patient presents for radioactive seed localization prior to surgery. I met with the patient and we discussed the procedure of seed localization including benefits and alternatives. We discussed the high likelihood of a successful procedure. We discussed the risks of the procedure including infection, bleeding, tissue injury and further surgery. We discussed the low dose of radioactivity involved in the procedure. Informed, written consent was given. The usual time-out protocol was performed immediately prior to the procedure. Using mammographic guidance, sterile technique, 1% lidocaine and an I-125 radioactive seed, the ribbon shaped clip was localized using a lateral approach. The follow-up  mammogram images confirm the seed in the expected location and were marked for the surgeon. Follow-up survey of the patient confirms presence of the radioactive seed. Order number of I-125 seed:  [id number]. Total activity:  3.545 millicurie reference Date: April 04, 2021 Using mammographic guidance, sterile technique, 1% lidocaine and an I-125 radioactive seed, the dumbbell shaped clip was localized using a lateral approach. The follow-up mammogram images confirm the seed in the expected location and were marked for the surgeon. Follow-up survey of the patient confirms presence of the radioactive seed. Order number of I-125 seed:  [id number]. Total activity:  3.428 millicuries reference Date: April 04, 2021 The patient tolerated the procedure well and was released from the [facility]. She was given instructions regarding seed removal. IMPRESSION: Radioactive seed localization right breast. No apparent complications. Electronically Signed   By: Dorise Bullion III M.D.   On: 04/27/2021 18:12   MM RT RADIO SEED EA ADD LESION LOC MAMMO  Result Date: 04/27/2021 CLINICAL DATA:  Localization of the  ribbon shaped clip and the dumbbell shaped clip prior to surgery. EXAM: MAMMOGRAPHIC GUIDED RADIOACTIVE SEED LOCALIZATION OF THE RIGHT BREAST COMPARISON:  Previous exam(s). FINDINGS: Patient presents for radioactive seed localization prior to surgery. I met with the patient and we discussed the procedure of seed localization including benefits and alternatives. We discussed the high likelihood of a successful procedure. We discussed the risks of the procedure including infection, bleeding, tissue injury and further surgery. We discussed the low dose of radioactivity involved in the procedure. Informed, written consent was given. The usual time-out protocol was performed immediately prior to the procedure. Using mammographic guidance, sterile technique, 1% lidocaine and an I-125 radioactive seed, the ribbon shaped clip was localized using a lateral approach. The follow-up mammogram images confirm the seed in the expected location and were marked for the surgeon. Follow-up survey of the patient confirms presence of the radioactive seed. Order number of I-125 seed:  [id number]. Total activity:  2.035 millicurie reference Date: April 04, 2021 Using mammographic guidance, sterile technique, 1% lidocaine and an I-125 radioactive seed, the dumbbell shaped clip was localized using a lateral approach. The follow-up mammogram images confirm the seed in the expected location and were marked for the surgeon. Follow-up survey of the patient confirms presence of the radioactive seed. Order number of I-125 seed:  [id number]. Total activity:  5.364 millicuries reference Date: April 04, 2021 The patient tolerated the procedure well and was released from the [facility]. She was given instructions regarding seed removal. IMPRESSION: Radioactive seed localization right breast. No apparent complications. Electronically Signed   By: Dorise Bullion III M.D.   On: 04/27/2021 18:12   MM CLIP PLACEMENT RIGHT  Addendum Date: 04/21/2021    ADDENDUM REPORT: 04/21/2021 15:56 ADDENDUM: Request is made for review of pathology and imaging findings prior to seed localization of the RIGHT breast. Ultrasound-guided core biopsy of mass in the UPPER OUTER QUADRANT of the RIGHT breast was marked with a ribbon shaped clip. Biopsy showed invasive and in situ mammary carcinoma. MR guided core biopsy of mass in the LATERAL middle depth of the RIGHT breast is marked with a barbell shaped clip and showed concordant lobular neoplasia (atypical lobular hyperplasia). Excision is recommended. MR guided core biopsy of non mass enhancement in the OUTER RIGHT breast is marked with a cylinder-shaped clip and showed benign concordant stromal fibrosis with fibrocystic changes and duct ectasia. Given these findings, recommend placement of 2 radioactive seeds, one at the at the  ribbon clip and another at the barbell shaped clip. Electronically Signed   By: Nolon Nations M.D.   On: 04/21/2021 15:56   Result Date: 04/21/2021 CLINICAL DATA:  Post biopsy mammogram of the right breast for clip placement. EXAM: 3D DIAGNOSTIC RIGHT MAMMOGRAM POST MRI BIOPSY COMPARISON:  Previous exam(s). FINDINGS: 3D Mammographic images were obtained following MRI guided biopsy of non mass enhancement in the lower outer right breast, and an enhancing mass in the lateral mid depth of the right breast. The biopsy marking clips are in expected position at the site sites of biopsy. IMPRESSION: 1. Appropriate positioning of the cylinder shaped biopsy marking clip at the site of biopsy in the lower outer right breast. 2. Appropriate positioning of the barbell shaped biopsy marking clip in the lateral mid to anterior depth of the right breast. Final Assessment: Post Procedure Mammograms for Marker Placement Electronically Signed: By: Ammie Ferrier M.D. On: 04/13/2021 11:33   MR RT BREAST BX W LOC DEV 1ST LESION IMAGE BX SPEC MR GUIDE  Addendum Date: 04/14/2021   ADDENDUM REPORT: 04/14/2021 12:57  ADDENDUM: Pathology revealed INCREASED STROMAL FIBROSIS WITH FIBROCYSTIC CHANGES, DUCT ECTASIA AND CALCIFICATIONS of the Right breast, lower outer, (cylinder clip). This was found to be concordant by Dr. Ammie Ferrier. Pathology revealed STROMAL FIBROSIS WITH LOBULAR NEOPLASIA (ATYPICAL LOBULAR HYPERPLASIA) AND ADENOSIS of the Right breast, lateral mid depth, (barbell clip). This was found to be concordant by Dr. Ammie Ferrier, with excision recommended. Pathology results were discussed with the patient by telephone. The patient reported doing well after the biopsies with tenderness and bruising at the sites. Post biopsy instructions and care were reviewed and questions were answered. The patient was encouraged to call [facility] for any additional concerns. My direct phone number was provided. The patient has a recent diagnosis of Right breast cancer and should follow her outlined treatment plan. Pathology results were sent to Dr. Barry Dienes and Bary Castilla, RN Oncology Navigator via [facility] message on April 14, 2021. Pathology results reported by Terie Purser, RN on 04/14/2021. Electronically Signed   By: Ammie Ferrier M.D.   On: 04/14/2021 12:57   Result Date: 04/14/2021 CLINICAL DATA:  80 year old female presenting for MRI guided biopsy of non mass enhancement in the lower outer right breast. During the procedure, a 6 mm enhancing mass was seen anterior to the biopsy proven cancer in the right breast, and therefore this lesion was biopsied as well. EXAM: MRI GUIDED CORE NEEDLE BIOPSY OF THE RIGHT BREAST TECHNIQUE: Multiplanar, multisequence MR imaging of the right breast was performed both before and after administration of intravenous contrast. CONTRAST:  63m GADAVIST GADOBUTROL 1 MMOL/ML IV SOLN COMPARISON:  Previous exams. FINDINGS: I met with the patient, and we discussed the procedure of MRI guided biopsy, including risks, benefits, and alternatives. Specifically, we  discussed the risks of infection, bleeding, tissue injury, clip migration, and inadequate sampling. Informed, written consent was given. The usual time out protocol was performed immediately prior to the procedure. Using sterile technique, 1% Lidocaine, MRI guidance, and a 9 gauge vacuum assisted device, biopsy was performed of non mass enhancement in the lower outer right breast using a lateral approach. At the conclusion of the procedure, a cylinder shaped tissue marker clip was deployed into the biopsy cavity. Using sterile technique, 1% Lidocaine, MRI guidance, and a 9 gauge vacuum assisted device, biopsy was performed of a 6 mm enhancing mass in the lateral middle depth of the right breast using  a lateral approach. At the conclusion of the procedure, a barbell shaped tissue marker clip was deployed into the biopsy cavity. Follow-up 2-view mammogram was performed and dictated separately. IMPRESSION: 1. MRI guided biopsy of non mass enhancement in the lower outer right breast. No apparent complications. 2. MRI guided biopsy of a 6 mm enhancing mass in the lateral middle depth of the right breast. No apparent complications. Electronically Signed: By: Ammie Ferrier M.D. On: 04/13/2021 11:31  MR RT BREAST BX W LOC DEV EA ADD LESION IMAGE BX SPEC MR GUIDE  Addendum Date: 04/14/2021   ADDENDUM REPORT: 04/14/2021 12:57 ADDENDUM: Pathology revealed INCREASED STROMAL FIBROSIS WITH FIBROCYSTIC CHANGES, DUCT ECTASIA AND CALCIFICATIONS of the Right breast, lower outer, (cylinder clip). This was found to be concordant by Dr. Ammie Ferrier. Pathology revealed STROMAL FIBROSIS WITH LOBULAR NEOPLASIA (ATYPICAL LOBULAR HYPERPLASIA) AND ADENOSIS of the Right breast, lateral mid depth, (barbell clip). This was found to be concordant by Dr. Ammie Ferrier, with excision recommended. Pathology results were discussed with the patient by telephone. The patient reported doing well after the biopsies with tenderness and  bruising at the sites. Post biopsy instructions and care were reviewed and questions were answered. The patient was encouraged to call [facility] for any additional concerns. My direct phone number was provided. The patient has a recent diagnosis of Right breast cancer and should follow her outlined treatment plan. Pathology results were sent to Dr. Barry Dienes and Bary Castilla, RN Oncology Navigator via [facility] message on April 14, 2021. Pathology results reported by Terie Purser, RN on 04/14/2021. Electronically Signed   By: Ammie Ferrier M.D.   On: 04/14/2021 12:57   Result Date: 04/14/2021 CLINICAL DATA:  80 year old female presenting for MRI guided biopsy of non mass enhancement in the lower outer right breast. During the procedure, a 6 mm enhancing mass was seen anterior to the biopsy proven cancer in the right breast, and therefore this lesion was biopsied as well. EXAM: MRI GUIDED CORE NEEDLE BIOPSY OF THE RIGHT BREAST TECHNIQUE: Multiplanar, multisequence MR imaging of the right breast was performed both before and after administration of intravenous contrast. CONTRAST:  66m GADAVIST GADOBUTROL 1 MMOL/ML IV SOLN COMPARISON:  Previous exams. FINDINGS: I met with the patient, and we discussed the procedure of MRI guided biopsy, including risks, benefits, and alternatives. Specifically, we discussed the risks of infection, bleeding, tissue injury, clip migration, and inadequate sampling. Informed, written consent was given. The usual time out protocol was performed immediately prior to the procedure. Using sterile technique, 1% Lidocaine, MRI guidance, and a 9 gauge vacuum assisted device, biopsy was performed of non mass enhancement in the lower outer right breast using a lateral approach. At the conclusion of the procedure, a cylinder shaped tissue marker clip was deployed into the biopsy cavity. Using sterile technique, 1% Lidocaine, MRI guidance, and a 9 gauge vacuum assisted  device, biopsy was performed of a 6 mm enhancing mass in the lateral middle depth of the right breast using a lateral approach. At the conclusion of the procedure, a barbell shaped tissue marker clip was deployed into the biopsy cavity. Follow-up 2-view mammogram was performed and dictated separately. IMPRESSION: 1. MRI guided biopsy of non mass enhancement in the lower outer right breast. No apparent complications. 2. MRI guided biopsy of a 6 mm enhancing mass in the lateral middle depth of the right breast. No apparent complications. Electronically Signed: By: MAmmie FerrierM.D. On: 04/13/2021 11:31  ELIGIBLE FOR AVAILABLE RESEARCH PROTOCOL: no  ASSESSMENT: 80 y.o. [place] woman status post left breast upper outer quadrant biopsy 06/12/2006 for invasive ductal carcinoma, grade 1,  (1) status post left lumpectomy with no sentinel lymph node sampling 07/08/2016 for a pT1c pNX, stage IA invasive ductal carcinoma, grade 1, estrogen and progesterone receptor positive, with an MIB-1 of 3%.  (2) status post adjuvant radiation to the left breast  (3) status post letrozole x5 years  (4) status post biopsy of the right breast upper outer quadrant 03/23/2021 for a clinical T2 N0, stage IA invasive ductal carcinoma, grade 1 or 2, estrogen receptor and progesterone receptor strongly positive, MIB-1 1%, with no amplification of HER2.  (5) genetics testing  (6) definitive surgery pending  (7) consider adjuvant radiation  (8) antiestrogen   PLAN: I met today with Basma to review her new diagnosis. Specifically we discussed the biology of her breast cancer, its diagnosis, staging, treatment  options and prognosis. We first reviewed the fact that cancer is not one disease but more than 100 different diseases and that it is important to keep them separate-- otherwise when friends and relatives discuss their own cancer experiences with Marnita confusion can result. Similarly we explained that if  breast cancer spreads to the bone or liver, the patient would not have bone cancer or liver cancer, but breast cancer in the bone and breast cancer in the liver: one cancer in three places-- not 3 different cancers which otherwise would have to be treated in 3 different ways.  We discussed the difference between local and systemic therapy. In terms of loco-regional treatment, lumpectomy plus radiation is equivalent to mastectomy as far as survival is concerned. For this reason, and because the cosmetic results are generally superior, we recommend breast conserving surgery.   We then discussed the rationale for systemic therapy. There is some risk that this cancer may have already spread to other parts of her body. Patients frequently ask at this point about bone scans, CAT scans and PET scans to find out if they have occult breast cancer somewhere else. The problem is that in early stage disease we are much more likely to find false positives then true cancers and this would expose the patient to unnecessary procedures as well as unnecessary radiation. Scans cannot answer the question the patient really would like to know, which is whether she has microscopic disease elsewhere in her body. For those reasons we do not recommend them.  Of course we would proceed to aggressive evaluation of any symptoms that might suggest metastatic disease, but that is not the case here.  Next we went over the options for systemic therapy which are anti-estrogens, anti-HER-2 immunotherapy, and chemotherapy. Jovee does not meet criteria for anti-HER-2 immunotherapy. She is a good candidate for anti-estrogens.  The question of chemotherapy is more complicated. Chemotherapy is most effective in rapidly growing, aggressive tumors. It is much less effective in low-grade, slow growing cancers, like Aziza 's. For that reason and in view of the patient's age we are going to forego Oncotype testing and not plan on chemotherapy.   The patient will receive antiestrogens as her systemic treatment   We also discussed genetics.  Mallory initially was not interested since her children are adopted but after our discussion she would like to be tested.  I have added lab work on 05/01/2021 when she will return to see Dr. Carmell Austria has a good understanding of the overall plan. She agrees with it.  She knows the goal of treatment in her case is cure. She will call with any problems that may develop before her next visit here.  Total encounter time 65 minutes.Sarajane Jews C. Willer Osorno, MD 04/27/2021 8:17 PM Medical Oncology and Hematology [facility] [place], [place] [address] Tel. [phone]    Fax. [phone]   This document serves as a record of services personally performed by Lurline Del, MD. It was created on his behalf by Wilburn Mylar, a trained medical scribe. The creation of this record is based on the scribe's personal observations and the provider's statements to them.   I, Lurline Del MD, have reviewed the above documentation for accuracy and completeness, and I agree with the above.    *Total Encounter Time as defined by the Centers for Medicare and Medicaid Services includes, in addition to the face-to-face time of a patient visit (documented in the note above) non-face-to-face time: obtaining and reviewing outside history, ordering and reviewing medications, tests or procedures, care coordination (communications with other health care professionals or caregivers) and documentation in the medical record.

## 2021-04-26 ENCOUNTER — Inpatient Hospital Stay: Payer: Medicare PPO | Attending: Hematology and Oncology | Admitting: Oncology

## 2021-04-26 ENCOUNTER — Other Ambulatory Visit: Payer: Self-pay

## 2021-04-26 ENCOUNTER — Other Ambulatory Visit: Payer: Self-pay | Admitting: General Surgery

## 2021-04-26 DIAGNOSIS — C50411 Malignant neoplasm of upper-outer quadrant of right female breast: Secondary | ICD-10-CM | POA: Diagnosis not present

## 2021-04-26 DIAGNOSIS — Z923 Personal history of irradiation: Secondary | ICD-10-CM | POA: Insufficient documentation

## 2021-04-26 DIAGNOSIS — R928 Other abnormal and inconclusive findings on diagnostic imaging of breast: Secondary | ICD-10-CM

## 2021-04-26 DIAGNOSIS — Z170 Estrogen receptor positive status [ER+]: Secondary | ICD-10-CM | POA: Insufficient documentation

## 2021-04-26 DIAGNOSIS — Z853 Personal history of malignant neoplasm of breast: Secondary | ICD-10-CM | POA: Diagnosis not present

## 2021-04-26 DIAGNOSIS — C50112 Malignant neoplasm of central portion of left female breast: Secondary | ICD-10-CM

## 2021-04-27 ENCOUNTER — Encounter: Payer: Self-pay | Admitting: *Deleted

## 2021-04-27 ENCOUNTER — Ambulatory Visit
Admission: RE | Admit: 2021-04-27 | Discharge: 2021-04-27 | Disposition: A | Discharge status: Home / Self Care | Payer: Medicare PPO | Source: Ambulatory Visit | Attending: General Surgery | Admitting: General Surgery

## 2021-04-27 DIAGNOSIS — R928 Other abnormal and inconclusive findings on diagnostic imaging of breast: Secondary | ICD-10-CM | POA: Diagnosis not present

## 2021-04-27 DIAGNOSIS — C50411 Malignant neoplasm of upper-outer quadrant of right female breast: Secondary | ICD-10-CM

## 2021-04-27 DIAGNOSIS — C50112 Malignant neoplasm of central portion of left female breast: Secondary | ICD-10-CM | POA: Insufficient documentation

## 2021-04-27 DIAGNOSIS — Z170 Estrogen receptor positive status [ER+]: Secondary | ICD-10-CM | POA: Insufficient documentation

## 2021-04-27 NOTE — Anesthesia Preprocedure Evaluation (Addendum)
Anesthesia Evaluation  Patient identified by MRN, date of birth, ID band Patient awake    Reviewed: Allergy & Precautions, NPO status , Patient's Chart, lab work & pertinent test results  History of Anesthesia Complications (+) PONV and history of anesthetic complications  Airway Mallampati: II  TM Distance: >3 FB Neck ROM: Full    Dental  (+) Teeth Intact, Dental Advisory Given   Pulmonary neg pulmonary ROS,    Pulmonary exam normal breath sounds clear to auscultation       Cardiovascular hypertension, Pt. on medications Normal cardiovascular exam Rhythm:Regular Rate:Normal     Neuro/Psych negative neurological ROS     GI/Hepatic Neg liver ROS, hiatal hernia, GERD  Medicated,  Endo/Other  Hypothyroidism   Renal/GU negative Renal ROS     Musculoskeletal  (+) Arthritis ,   Abdominal   Peds  Hematology negative hematology ROS (+)   Anesthesia Other Findings RIGHT BREAST CANCER  Reproductive/Obstetrics                            Anesthesia Physical Anesthesia Plan  ASA: 2  Anesthesia Plan: General   Post-op Pain Management:  Regional for Post-op pain   Induction: Intravenous  PONV Risk Score and Plan: 4 or greater and TIVA, Dexamethasone and Ondansetron  Airway Management Planned: LMA  Additional Equipment:   Intra-op Plan:   Post-operative Plan: Extubation in OR  Informed Consent: I have reviewed the patients History and Physical, chart, labs and discussed the procedure including the risks, benefits and alternatives for the proposed anesthesia with the patient or authorized representative who has indicated his/her understanding and acceptance.     Dental advisory given  Plan Discussed with:   Anesthesia Plan Comments:        Anesthesia Quick Evaluation

## 2021-04-28 ENCOUNTER — Ambulatory Visit (HOSPITAL_COMMUNITY): Payer: Medicare PPO | Admitting: Anesthesiology

## 2021-04-28 ENCOUNTER — Ambulatory Visit (HOSPITAL_COMMUNITY)
Admission: RE | Admit: 2021-04-28 | Discharge: 2021-04-28 | Disposition: A | Discharge status: Home / Self Care | Payer: Medicare PPO | Attending: General Surgery | Admitting: General Surgery

## 2021-04-28 ENCOUNTER — Other Ambulatory Visit: Payer: Self-pay | Admitting: Genetic Counselor

## 2021-04-28 ENCOUNTER — Ambulatory Visit
Admit: 2021-04-28 | Discharge: 2021-04-28 | Disposition: A | Discharge status: Home / Self Care | Payer: Medicare PPO | Attending: General Surgery | Admitting: General Surgery

## 2021-04-28 ENCOUNTER — Ambulatory Visit (HOSPITAL_COMMUNITY)
Admission: RE | Admit: 2021-04-28 | Discharge: 2021-04-28 | Disposition: A | Discharge status: Home / Self Care | Payer: Medicare PPO | Source: Ambulatory Visit | Attending: General Surgery | Admitting: General Surgery

## 2021-04-28 ENCOUNTER — Ambulatory Visit
Admission: RE | Admit: 2021-04-28 | Discharge: 2021-04-28 | Disposition: A | Discharge status: Home / Self Care | Payer: Medicare PPO | Source: Ambulatory Visit | Attending: General Surgery | Admitting: General Surgery

## 2021-04-28 ENCOUNTER — Ambulatory Visit (HOSPITAL_COMMUNITY): Payer: Medicare PPO

## 2021-04-28 ENCOUNTER — Encounter (HOSPITAL_COMMUNITY)
Admission: RE | Disposition: A | Discharge status: Home / Self Care | Payer: Self-pay | Source: Home / Self Care | Attending: General Surgery

## 2021-04-28 DIAGNOSIS — Z803 Family history of malignant neoplasm of breast: Secondary | ICD-10-CM | POA: Diagnosis not present

## 2021-04-28 DIAGNOSIS — Z808 Family history of malignant neoplasm of other organs or systems: Secondary | ICD-10-CM | POA: Diagnosis not present

## 2021-04-28 DIAGNOSIS — C50411 Malignant neoplasm of upper-outer quadrant of right female breast: Secondary | ICD-10-CM

## 2021-04-28 DIAGNOSIS — Z79890 Hormone replacement therapy (postmenopausal): Secondary | ICD-10-CM | POA: Diagnosis not present

## 2021-04-28 DIAGNOSIS — Z170 Estrogen receptor positive status [ER+]: Secondary | ICD-10-CM | POA: Insufficient documentation

## 2021-04-28 DIAGNOSIS — Z96642 Presence of left artificial hip joint: Secondary | ICD-10-CM | POA: Insufficient documentation

## 2021-04-28 DIAGNOSIS — G8918 Other acute postprocedural pain: Secondary | ICD-10-CM | POA: Diagnosis not present

## 2021-04-28 DIAGNOSIS — C50911 Malignant neoplasm of unspecified site of right female breast: Secondary | ICD-10-CM | POA: Diagnosis not present

## 2021-04-28 DIAGNOSIS — E039 Hypothyroidism, unspecified: Secondary | ICD-10-CM | POA: Diagnosis not present

## 2021-04-28 HISTORY — PX: BREAST LUMPECTOMY WITH RADIOACTIVE SEED AND SENTINEL LYMPH NODE BIOPSY: SHX6550

## 2021-04-28 SURGERY — BREAST LUMPECTOMY WITH RADIOACTIVE SEED AND SENTINEL LYMPH NODE BIOPSY
Anesthesia: General | Site: Breast | Laterality: Right

## 2021-04-28 MED ORDER — LIDOCAINE HCL (PF) 1 % IJ SOLN
INTRAMUSCULAR | Status: AC
  Filled 2021-04-28: qty 30

## 2021-04-28 MED ORDER — PROPOFOL 500 MG/50ML IV EMUL
INTRAVENOUS | Status: DC | PRN
  Administered 2021-04-28: 50 ug/kg/min via INTRAVENOUS

## 2021-04-28 MED ORDER — CHLORHEXIDINE GLUCONATE 0.12 % MT SOLN: 15.0000 mL | Freq: Once | OROMUCOSAL | Status: AC

## 2021-04-28 MED ORDER — LIDOCAINE HCL 1 % IJ SOLN
INTRAMUSCULAR | Status: DC | PRN
  Administered 2021-04-28: 20 mL via INTRAMUSCULAR

## 2021-04-28 MED ORDER — LACTATED RINGERS IV SOLN: INTRAVENOUS | Status: DC | PRN

## 2021-04-28 MED ORDER — MIDAZOLAM HCL 2 MG/2ML IJ SOLN
INTRAMUSCULAR | Status: AC
  Filled 2021-04-28: qty 2

## 2021-04-28 MED ORDER — FENTANYL CITRATE (PF) 100 MCG/2ML IJ SOLN
INTRAMUSCULAR | Status: AC
  Administered 2021-04-28: 50 ug
  Filled 2021-04-28: qty 2

## 2021-04-28 MED ORDER — MAGTRACE LYMPHATIC TRACER
INTRAMUSCULAR | Status: DC | PRN
  Administered 2021-04-28: 2 mL via INTRAMUSCULAR

## 2021-04-28 MED ORDER — TECHNETIUM TC 99M TILMANOCEPT KIT
1.0000 | PACK | Freq: Once | INTRAVENOUS | Status: AC | PRN
  Administered 2021-04-28: 1 via INTRADERMAL

## 2021-04-28 MED ORDER — ACETAMINOPHEN 500 MG PO TABS
ORAL_TABLET | ORAL | Status: AC
  Administered 2021-04-28: 1000 mg via ORAL
  Filled 2021-04-28: qty 2

## 2021-04-28 MED ORDER — FENTANYL CITRATE PF 50 MCG/ML IJ SOSY
50.0000 ug | PREFILLED_SYRINGE | Freq: Once | INTRAMUSCULAR | Status: DC
Start: 2021-04-28 — End: 2021-04-28
  Filled 2021-04-28: qty 1

## 2021-04-28 MED ORDER — ORAL CARE MOUTH RINSE: 15.0000 mL | Freq: Once | OROMUCOSAL | Status: AC

## 2021-04-28 MED ORDER — ONDANSETRON HCL 4 MG/2ML IJ SOLN
INTRAMUSCULAR | Status: DC | PRN
  Administered 2021-04-28: 4 mg via INTRAVENOUS

## 2021-04-28 MED ORDER — ONDANSETRON HCL 4 MG/2ML IJ SOLN
INTRAMUSCULAR | Status: AC
  Filled 2021-04-28: qty 2

## 2021-04-28 MED ORDER — FENTANYL CITRATE (PF) 100 MCG/2ML IJ SOLN: 25.0000 ug | INTRAMUSCULAR | Status: DC | PRN

## 2021-04-28 MED ORDER — BUPIVACAINE-EPINEPHRINE (PF) 0.5% -1:200000 IJ SOLN
INTRAMUSCULAR | Status: DC | PRN
  Administered 2021-04-28: 30 mL via PERINEURAL

## 2021-04-28 MED ORDER — OXYCODONE HCL 5 MG PO TABS: 2.5000 mg | ORAL_TABLET | Freq: Four times a day (QID) | ORAL | 0 refills | Status: DC | PRN

## 2021-04-28 MED ORDER — DEXAMETHASONE SODIUM PHOSPHATE 10 MG/ML IJ SOLN
INTRAMUSCULAR | Status: AC
  Filled 2021-04-28: qty 1

## 2021-04-28 MED ORDER — PROPOFOL 10 MG/ML IV BOLUS
INTRAVENOUS | Status: AC
  Filled 2021-04-28: qty 20

## 2021-04-28 MED ORDER — CHLORHEXIDINE GLUCONATE 0.12 % MT SOLN
OROMUCOSAL | Status: AC
  Administered 2021-04-28: 15 mL via OROMUCOSAL
  Filled 2021-04-28: qty 15

## 2021-04-28 MED ORDER — ACETAMINOPHEN 500 MG PO TABS: 1000.0000 mg | ORAL_TABLET | ORAL | Status: AC

## 2021-04-28 MED ORDER — PHENYLEPHRINE HCL-NACL 20-0.9 MG/250ML-% IV SOLN
INTRAVENOUS | Status: DC | PRN
  Administered 2021-04-28: 25 ug/min via INTRAVENOUS

## 2021-04-28 MED ORDER — LIDOCAINE 2% (20 MG/ML) 5 ML SYRINGE
INTRAMUSCULAR | Status: AC
  Filled 2021-04-28: qty 5

## 2021-04-28 MED ORDER — CEFAZOLIN SODIUM-DEXTROSE 2-4 GM/100ML-% IV SOLN
2.0000 g | INTRAVENOUS | Status: AC
  Administered 2021-04-28: 2 g via INTRAVENOUS

## 2021-04-28 MED ORDER — FENTANYL CITRATE (PF) 100 MCG/2ML IJ SOLN
INTRAMUSCULAR | Status: DC | PRN
  Administered 2021-04-28 (×2): 25 ug via INTRAVENOUS

## 2021-04-28 MED ORDER — CHLORHEXIDINE GLUCONATE CLOTH 2 % EX PADS: 6.0000 | MEDICATED_PAD | Freq: Once | CUTANEOUS | Status: DC

## 2021-04-28 MED ORDER — FENTANYL CITRATE (PF) 250 MCG/5ML IJ SOLN
INTRAMUSCULAR | Status: AC
  Filled 2021-04-28: qty 5

## 2021-04-28 MED ORDER — CEFAZOLIN SODIUM-DEXTROSE 2-4 GM/100ML-% IV SOLN
INTRAVENOUS | Status: AC
  Filled 2021-04-28: qty 100

## 2021-04-28 MED ORDER — EPHEDRINE SULFATE 50 MG/ML IJ SOLN
INTRAMUSCULAR | Status: DC | PRN
  Administered 2021-04-28: 10 mg via INTRAVENOUS

## 2021-04-28 MED ORDER — 0.9 % SODIUM CHLORIDE (POUR BTL) OPTIME
TOPICAL | Status: DC | PRN
  Administered 2021-04-28: 1000 mL

## 2021-04-28 MED ORDER — ONDANSETRON HCL 4 MG/2ML IJ SOLN: 4.0000 mg | Freq: Once | INTRAMUSCULAR | Status: DC | PRN

## 2021-04-28 MED ORDER — PROPOFOL 10 MG/ML IV BOLUS
INTRAVENOUS | Status: DC | PRN
  Administered 2021-04-28: 150 mg via INTRAVENOUS

## 2021-04-28 MED ORDER — DEXAMETHASONE SODIUM PHOSPHATE 4 MG/ML IJ SOLN
INTRAMUSCULAR | Status: DC | PRN
  Administered 2021-04-28: 10 mg via INTRAVENOUS

## 2021-04-28 MED ORDER — CLONIDINE HCL (ANALGESIA) 100 MCG/ML EP SOLN
EPIDURAL | Status: DC | PRN
  Administered 2021-04-28: 50 ug

## 2021-04-28 MED ORDER — LACTATED RINGERS IV SOLN: INTRAVENOUS | Status: DC

## 2021-04-28 MED ORDER — BUPIVACAINE-EPINEPHRINE (PF) 0.25% -1:200000 IJ SOLN
INTRAMUSCULAR | Status: AC
  Filled 2021-04-28: qty 30

## 2021-04-28 SURGICAL SUPPLY — 44 items
ADH SKN CLS APL DERMABOND .7 (GAUZE/BANDAGES/DRESSINGS) ×1
APL PRP STRL LF DISP 70% ISPRP (MISCELLANEOUS) ×1
BAG COUNTER SPONGE SURGICOUNT (BAG) ×2 IMPLANT
BAG SPNG CNTER NS LX DISP (BAG) ×1
BAG SURGICOUNT SPONGE COUNTING (BAG) ×1
BINDER BREAST LRG (GAUZE/BANDAGES/DRESSINGS) ×3 IMPLANT
BNDG COHESIVE 4X5 TAN STRL (GAUZE/BANDAGES/DRESSINGS) ×3 IMPLANT
CANISTER SUCT 3000ML PPV (MISCELLANEOUS) ×3 IMPLANT
CHLORAPREP W/TINT 26 (MISCELLANEOUS) ×3 IMPLANT
CLIP VESOCCLUDE LG 6/CT (CLIP) ×3 IMPLANT
CLIP VESOCCLUDE MED 24/CT (CLIP) ×3 IMPLANT
CLOSURE WOUND 1/2 X4 (GAUZE/BANDAGES/DRESSINGS) ×1
COVER PROBE W GEL 5X96 (DRAPES) ×3 IMPLANT
COVER SURGICAL LIGHT HANDLE (MISCELLANEOUS) ×3 IMPLANT
DERMABOND ADVANCED (GAUZE/BANDAGES/DRESSINGS) ×2
DERMABOND ADVANCED .7 DNX12 (GAUZE/BANDAGES/DRESSINGS) ×1 IMPLANT
DEVICE DUBIN SPECIMEN MAMMOGRA (MISCELLANEOUS) ×3 IMPLANT
DRAPE CHEST BREAST 15X10 FENES (DRAPES) ×3 IMPLANT
ELECT COATED BLADE 2.86 ST (ELECTRODE) ×3 IMPLANT
ELECT NEEDLE BLADE 2-5/6 (NEEDLE) ×3 IMPLANT
ELECT REM PT RETURN 9FT ADLT (ELECTROSURGICAL) ×3
ELECTRODE REM PT RTRN 9FT ADLT (ELECTROSURGICAL) ×1 IMPLANT
GAUZE SPONGE 4X4 12PLY STRL (GAUZE/BANDAGES/DRESSINGS) ×3 IMPLANT
GLOVE SURG ENC MOIS LTX SZ6 (GLOVE) ×3 IMPLANT
GLOVE SURG UNDER LTX SZ6.5 (GLOVE) ×3 IMPLANT
GOWN STRL REUS W/ TWL LRG LVL3 (GOWN DISPOSABLE) ×1 IMPLANT
GOWN STRL REUS W/TWL 2XL LVL3 (GOWN DISPOSABLE) ×3 IMPLANT
GOWN STRL REUS W/TWL LRG LVL3 (GOWN DISPOSABLE) ×3
KIT BASIN OR (CUSTOM PROCEDURE TRAY) ×3 IMPLANT
KIT MARKER MARGIN INK (KITS) ×3 IMPLANT
LIGHT WAVEGUIDE WIDE FLAT (MISCELLANEOUS) ×3 IMPLANT
NEEDLE HYPO 25GX1X1/2 BEV (NEEDLE) ×3 IMPLANT
NS IRRIG 1000ML POUR BTL (IV SOLUTION) ×3 IMPLANT
PACK GENERAL/GYN (CUSTOM PROCEDURE TRAY) ×3 IMPLANT
PACK UNIVERSAL I (CUSTOM PROCEDURE TRAY) ×3 IMPLANT
PAD ABD 8X10 STRL (GAUZE/BANDAGES/DRESSINGS) ×3 IMPLANT
STOCKINETTE IMPERVIOUS 9X36 MD (GAUZE/BANDAGES/DRESSINGS) ×3 IMPLANT
STRIP CLOSURE SKIN 1/2X4 (GAUZE/BANDAGES/DRESSINGS) ×2 IMPLANT
SUT MNCRL AB 4-0 PS2 18 (SUTURE) ×3 IMPLANT
SUT VIC AB 3-0 SH 8-18 (SUTURE) ×3 IMPLANT
SYR CONTROL 10ML LL (SYRINGE) ×3 IMPLANT
TOWEL GREEN STERILE (TOWEL DISPOSABLE) ×3 IMPLANT
TOWEL GREEN STERILE FF (TOWEL DISPOSABLE) ×3 IMPLANT
TRACER MAGTRACE VIAL (MISCELLANEOUS) ×3 IMPLANT

## 2021-04-28 NOTE — Anesthesia Procedure Notes (Signed)
Anesthesia Regional Block: Pectoralis block   Pre-Anesthetic Checklist: , timeout performed,  Correct Patient, Correct Site, Correct Laterality,  Correct Procedure, Correct Position, site marked,  Risks and benefits discussed,  Surgical consent,  Pre-op evaluation,  At surgeon's request and post-op pain management  Laterality: Right  Prep: chloraprep       Needles:  Injection technique: Single-shot  Needle Type: Echogenic Needle     Needle Length: 9cm  Needle Gauge: 21     Additional Needles:   Procedures:,,,, ultrasound used (permanent image in chart),,    Narrative:  Start time: 04/28/2021 8:25 AM End time: 04/28/2021 8:30 AM Injection made incrementally with aspirations every 5 mL.  Performed by: Personally  Anesthesiologist: Catalina Gravel, MD  Additional Notes: No pain on injection. No increased resistance to injection. Injection made in 5cc increments.  Good needle visualization.  Patient tolerated procedure well.

## 2021-04-28 NOTE — Interval H&P Note (Signed)
History and Physical Interval Note:  04/28/2021 8:33 AM  Rebecca [name]  has presented today for surgery, with the diagnosis of RIGHT BREAST CANCER.  The various methods of treatment have been discussed with the patient and family. After consideration of risks, benefits and other options for treatment, the patient has consented to  Procedure(s): RIGHT BREAST LUMPECTOMY WITH RADIOACTIVE SEED AND SENTINEL LYMPH NODE BIOPSY (Right) as a surgical intervention.  A second area was found and has been localized as well, so two seed localized lumpectomies will be performed.  The patient's history has been reviewed, patient examined, no change in status, stable for surgery.  I have reviewed the patient's chart and labs.  Questions were answered to the patient's satisfaction.     Stark Klein

## 2021-04-28 NOTE — H&P (Signed)
REFERRING PHYSICIAN: Dr. Melanee [name]  PROVIDER: Georgianne Fick, MD  Care Team: Patient Care Team: Rebecca [name] as PCP - General   MRN: [id number] DOB: 1940-12-23 DATE OF ENCOUNTER: 03/31/2021  Subjective   Chief Complaint: Right breast cancer   History of Present Illness: Rebecca [name] is a 80 y.o. female who is seen today as an office consultation at the request of Rebecca [name] for evaluation of Right breast cancer .   Patient is an 80 year old female referred for a new diagnosis of right breast cancer in August 2022. She had a previous breast cancer and 2004 on the left that was treated with breast conservation, radiation, and tamoxifen. Rebecca [name] was her surgeon. She did not receive chemotherapy at the time but it was offered.  This time she presented with a palpable mass. She has been getting annual mammograms. It was in the upper outer quadrant on the right. It measured approximately 2.2 cm by ultrasound. This was described as irregular and hypoechoic. A core needle biopsy was performed that demonstrated grade 1-2 invasive mammary carcinoma that was ductal phenotype. This was positive for estrogen and progesterone receptors and Ki-67 was 1%. HER2 was not overexpressed. Due to her breast density and previous breast cancer, an MRI was performed. There was 1.2 cm of non-mass enhancement in the lower outer quadrant on the right. This has not been biopsied.   She has a sister who is an OB/GYN.  03/20/2021 EXAM: DIGITAL DIAGNOSTIC UNILATERAL RIGHT MAMMOGRAM WITH TOMOSYNTHESIS AND CAD; ULTRASOUND RIGHT BREAST LIMITED   TECHNIQUE: Right digital diagnostic mammography and breast tomosynthesis was performed. The images were evaluated with computer-aided detection.; Targeted ultrasound examination of the right breast was performed   COMPARISON: Previous exam(s).   ACR Breast Density Category c: The breast tissue is heterogeneously dense, which may obscure small masses.   FINDINGS: Within  the upper-outer right breast anterior depth there is a persistent irregular mass partially obscured with associated dense surrounding fibroglandular tissue.   On physical exam, dense tissue is palpated upper-outer right breast.   Targeted ultrasound is performed, showing a 1.6 x 1.5 x 2.2 cm irregular hypoechoic mass right breast 10 o'clock position 2 cm from the nipple. No right axillary adenopathy.   IMPRESSION: Suspicious right breast mass 10 o'clock position.   RECOMMENDATION: Ultrasound-guided core needle biopsy right breast mass 10 o'clock position.   I have discussed the findings and recommendations with the patient. If applicable, a reminder letter will be sent to the patient regarding the next appointment.   BI-RADS CATEGORY 5: Highly suggestive of malignancy.    Pathology core needle biopsy: 03/23/2021 Breast, right, needle core biopsy, upper outer, 10 o'clock - INVASIVE MAMMARY CARCINOMA, GRADE 1/2. - MAMMARY CARCINOMA IN SITU.  Receptors: Estrogen Receptor: 100%, POSITIVE, STRONG STAINING INTENSITY Progesterone Receptor: 95%, POSITIVE, STRONG STAINING INTENSITY Proliferation Marker Ki67: 1% GROUP 5: HER2 **NEGATIVE**  MRI breast 03/28/21   IMPRESSION: 1. Biopsy-proven malignancy in the upper-outer right breast measuring 1.2 x 1.5 x 1.5 cm. 2. Indeterminate focal non-mass enhancement in the lower outer right breast measuring 1.2 x 0.4 x 0.3 cm. 3. No MRI evidence of malignancy in the left breast. 4. No suspicious lymphadenopathy.   RECOMMENDATION: MRI guided biopsy of the focal non-mass enhancement in the lower outer right breast.   BI-RADS CATEGORY 4: Suspicious.    Review of Systems: A complete review of systems was obtained from the patient. I have reviewed this information and discussed as appropriate with the patient. See HPI  as well for other ROS.  Review of Systems  Endo/Heme/Allergies: Bruises/bleeds easily.  All other systems reviewed and are  negative.   Medical History: History reviewed. No pertinent past medical history.  Patient Active Problem List  Diagnosis   Malignant neoplasm of upper-outer quadrant of right breast in female, estrogen receptor positive (CMS-HCC)   History of left breast cancer   Abnormal MRI, breast   Past Surgical History:  Procedure Laterality Date   Left Breast Lumpectomy 2004   Left Total Hip Arthroplasty 03/06/2019  Rebecca [name]    No Known Allergies  Current Outpatient Medications on File Prior to Visit  Medication Sig Dispense Refill   olmesartan (BENICAR) 20 MG tablet 1 tablet   cholecalciferol (VITAMIN D3) 1000 unit tablet Take by mouth   iron polysaccharides (FERREX) 150 mg iron capsule Take 150 mg by mouth once daily   levothyroxine (SYNTHROID) 75 MCG tablet Take 1 tablet by mouth once daily   No current facility-administered medications on file prior to visit.   Family History  Problem Relation Age of Onset   Skin cancer Father   Breast cancer Sister    Social History   Tobacco Use  Smoking Status Never Smoker  Smokeless Tobacco Never Used    Social History   Socioeconomic History   Marital status: Married  Tobacco Use   Smoking status: Never Smoker   Smokeless tobacco: Never Used  Substance and Sexual Activity   Alcohol use: Yes  Comment: Very Rarely   Drug use: Never   Objective:   Vitals:  03/31/21 1158  BP: 126/80  [name]: 78  Temp: 36.7 C (98.1 F)  SpO2: 98%  Weight: 60.5 kg (133 lb 6.4 oz)  [name]: 167.6 cm ('5\' 6"' )   Body mass index is 21.53 kg/m.  Gen: No acute distress. Well nourished and well groomed.  Neurological: Alert and oriented to person, place, and time. Coordination normal.  Head: Normocephalic and atraumatic.  Eyes: Conjunctivae are normal. Pupils are equal, round, and reactive to light. No scleral icterus.  Neck: Normal range of motion. Neck supple. No tracheal deviation or thyromegaly present.  Cardiovascular: Normal rate,  regular rhythm, normal heart sounds and intact distal pulses. Exam reveals no gallop and no friction rub. No murmur heard. Breast: left breast contracted with chronic thickening LOQ. Nipple deviation to a chronic location per patient. Right breast with vaguely palpable mass at 9 o'clock around 1.5 cm. No nipple retraction or discharge. No palpable LAD. No lymphedema. Bruising on right breast from bx.  Respiratory: Effort normal. No respiratory distress. No chest wall tenderness. Breath sounds normal. No wheezes, rales or rhonchi.  GI: Soft. Bowel sounds are normal. The abdomen is soft and nontender. There is no rebound and no guarding.  Musculoskeletal: Normal range of motion. Extremities are nontender.  Lymphadenopathy: No cervical, preauricular, postauricular or axillary adenopathy is present Skin: Skin is warm and dry. No rash noted. No diaphoresis. No erythema. No pallor. No clubbing, cyanosis, or edema.  Psychiatric: Normal mood and affect. Behavior is normal. Judgment and thought content normal.   Labs None.  Assessment and Plan:   Abnormal MRI, breast Will get MR biopsy right breast LOQ at site of non mass enhancement. If needed, will add seed to this for excision as well.   Malignant neoplasm of upper-outer quadrant of right breast in female, estrogen receptor positive (CMS-HCC) Pt would like to pursue breast conservation. Given that this is a T2, I would plan a sentinel lymph node  biopsy even with good prognostic profile and age of 10. She is in agreement with that and is very anxious about potential spread of disease.  I will plan seed localized lumpectomy and SLN bx after MR biopsy of NME.   She will need referral to oncology and possible to radiation oncology post op.   The surgical procedure was described to the patient. I discussed the incision type and location and that we would need radiology involved on with a wire or seed marker and/or sentinel node.   The risks and  benefits of the procedure were described to the patient and she wishes to proceed.   We discussed the risks bleeding, infection, damage to other structures, need for further procedures/surgeries. We discussed the risk of seroma. The patient was advised if the area in the breast in cancer, we may need to go back to surgery for additional tissue to obtain negative margins or for a lymph node biopsy. The patient was advised that these are the most common complications, but that others can occur as well. They were advised against taking aspirin or other anti-inflammatory agents/blood thinners the week before surgery.   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   No follow-ups on file.  Rebecca Height, MD FACS Surgical Oncology, General Surgery, Trauma and [facility] [place]

## 2021-04-28 NOTE — Anesthesia Postprocedure Evaluation (Signed)
Anesthesia Post Note  Patient: Rebecca [name]  Procedure(s) Performed: RIGHT BREAST LUMPECTOMY WITH RADIOACTIVE SEED x 2 AND SENTINEL LYMPH NODE BIOPSY (Right: Breast)     Patient location during evaluation: PACU Anesthesia Type: General Level of consciousness: awake and alert Pain management: pain level controlled Vital Signs Assessment: post-procedure vital signs reviewed and stable Respiratory status: spontaneous breathing, nonlabored ventilation and respiratory function stable Cardiovascular status: blood pressure returned to baseline and stable Postop Assessment: no apparent nausea or vomiting Anesthetic complications: no   No notable events documented.  Last Vitals:  Vitals:   04/28/21 1145 04/28/21 1200  BP: 132/70 121/66  Pulse: 69 66  Resp: 17 18  Temp:  36.6 C  SpO2: 100% 99%    Last Pain:  Vitals:   04/28/21 1200  TempSrc:   PainSc: 0-No pain                 Catalina Gravel

## 2021-04-28 NOTE — Interval H&P Note (Deleted)
History and Physical Interval Note:  04/28/2021 8:28 AM  Rebecca [name]  has presented today for surgery, with the diagnosis of RIGHT BREAST CANCER.  The various methods of treatment have been discussed with the patient and family. After consideration of risks, benefits and other options for treatment, the patient has consented to  Procedure(s): RIGHT BREAST LUMPECTOMY WITH RADIOACTIVE SEED AND SENTINEL LYMPH NODE BIOPSY (Right) as a surgical intervention.  The patient's history has been reviewed, patient examined, no change in status, stable for surgery.  I have reviewed the patient's chart and labs.  Questions were answered to the patient's satisfaction.     Stark Klein

## 2021-04-28 NOTE — Op Note (Signed)
Right Breast Radioactive seed bracketed lumpectomy and sentinel lymph node biopsy  Indications: This patient presents with history of Right breast cancer, upper outer quadrant, grade 1 invasive ductal carcinoma, +/+/- and ALH.    Pre-operative Diagnosis: Right breast cancer and ALH  Post-operative Diagnosis: Same  Surgeon: Stark Klein   Anesthesia: General endotracheal anesthesia  ASA Class: 2  Procedure Details  The patient was seen in the Holding Room. The risks, benefits, complications, treatment options, and expected outcomes were discussed with the patient. The possibilities of bleeding, infection, the need for additional procedures, failure to diagnose a condition, and creating a complication requiring transfusion or operation were discussed with the patient. The patient concurred with the proposed plan, giving informed consent.  The site of surgery properly noted/marked. The patient was taken to Operating Room # 1, identified, and the procedure verified as Right Breast Seed localized Lumpectomy x 2 with sentinel lymph node biopsy. A Time Out was held and the above information confirmed. The MagTrace was injected in the subareolar location  The right arm, breast, and chest were prepped and draped in standard fashion. The lumpectomy was performed by creating a inferior circumareolar incision near the previously placed radioactive seed.  Dissection was carried down to around the point of maximum signal intensity. The cautery was used to perform the dissection.  Hemostasis was achieved with cautery. The edges of the cavity were marked with large clips, with one each medial, lateral, inferior and superior, and two clips posteriorly.   The specimen was inked with the margin marker paint kit.   Both seeds were in the specimen.  Specimen radiography confirmed inclusion of the mammographic lesion, the clip, and the seed.  The background signal in the breast was zero.  The wound was irrigated and  closed with 3-0 vicryl in layers and 4-0 monocryl subcuticular suture.    Using a hand-held gamma probe, right axillary sentinel nodes were identified transcutaneously.  An oblique incision was created below the axillary hairline.  Dissection was carried through the clavipectoral fascia.  Five deep level 2 axillary sentinel nodes were removed.  The wound was irrigated.  Hemostasis was achieved with cautery.  The axillary incision was closed with a 3-0 vicryl deep dermal interrupted sutures and a 4-0 monocryl subcuticular closure.    Sterile dressings were applied. At the end of the operation, all sponge, instrument, and needle counts were correct.  Findings: grossly clear surgical margins and no adenopathy, anterior and lateral margins are skin, posterior margin is pectoralis.  Nodes were all hot with MagTrace and T99. #3 was also rust colored  Estimated Blood Loss:  min         Specimens: right breast tissue with seed and Five axillary sentinel lymph nodes.             Complications:  None; patient tolerated the procedure well.         Disposition: PACU - hemodynamically stable.         Condition: stable

## 2021-04-28 NOTE — Discharge Instructions (Addendum)
[facility] Office Phone Number [phone]  BREAST BIOPSY/ PARTIAL MASTECTOMY: POST OP INSTRUCTIONS  Always review your discharge instruction sheet given to you by the facility where your surgery was performed.  IF YOU HAVE DISABILITY OR FAMILY LEAVE FORMS, YOU MUST BRING THEM TO THE OFFICE FOR PROCESSING.  DO NOT GIVE THEM TO YOUR DOCTOR.  A prescription for pain medication may be given to you upon discharge.  Take your pain medication as prescribed, if needed.  If narcotic pain medicine is not needed, then you may take acetaminophen (Tylenol) or ibuprofen (Advil) as needed. Take your usually prescribed medications unless otherwise directed If you need a refill on your pain medication, please contact your pharmacy.  They will contact our office to request authorization.  Prescriptions will not be filled after 5pm or on week-ends. You should eat very light the first 24 hours after surgery, such as soup, crackers, pudding, etc.  Resume your normal diet the day after surgery. Most patients will experience some swelling and bruising in the breast.  Ice packs and a good support bra will help.  Swelling and bruising can take several days to resolve.  It is common to experience some constipation if taking pain medication after surgery.  Increasing fluid intake and taking a stool softener will usually help or prevent this problem from occurring.  A mild laxative (Milk of Magnesia or Miralax) should be taken according to package directions if there are no bowel movements after 48 hours. Unless discharge instructions indicate otherwise, you may remove your bandages 48 hours after surgery, and you may shower at that time.  You may have steri-strips (small skin tapes) in place directly over the incision.  These strips should be left on the skin for 7-10 days.   Any sutures or staples will be removed at the office during your follow-up visit. ACTIVITIES:  You may resume regular daily activities  (gradually increasing) beginning the next day.  Wearing a good support bra or sports bra (or the breast binder) minimizes pain and swelling.  You may have sexual intercourse when it is comfortable. You may drive when you no longer are taking prescription pain medication, you can comfortably wear a seatbelt, and you can safely maneuver your car and apply brakes. RETURN TO WORK:  __________1 week_______________ You should see your doctor in the office for a follow-up appointment approximately two weeks after your surgery.  Your doctor's nurse will typically make your follow-up appointment when she calls you with your pathology report.  Expect your pathology report 2-3 business days after your surgery.  You may call to check if you do not hear from us after three days.   WHEN TO CALL YOUR DOCTOR: Fever over 101.0 Nausea and/or vomiting. Extreme swelling or bruising. Continued bleeding from incision. Increased pain, redness, or drainage from the incision.  The clinic staff is available to answer your questions during regular business hours.  Please don't hesitate to call and ask to speak to one of the nurses for clinical concerns.  If you have a medical emergency, go to the nearest emergency room or call 911.  A surgeon from [facility] is always on call at the hospital.  For further questions, please visit [email or web address]   

## 2021-04-28 NOTE — Transfer of Care (Signed)
Immediate Anesthesia Transfer of Care Note  Patient: Rebecca [name]  Procedure(s) Performed: RIGHT BREAST LUMPECTOMY WITH RADIOACTIVE SEED x 2 AND SENTINEL LYMPH NODE BIOPSY (Right: Breast)  Patient Location: PACU  Anesthesia Type:General  Level of Consciousness: drowsy  Airway & Oxygen Therapy: Patient Spontanous Breathing and Patient connected to nasal cannula oxygen  Post-op Assessment: Report given to RN and Post -op Vital signs reviewed and stable  Post vital signs: Reviewed and stable  Last Vitals:  Vitals Value Taken Time  BP 123/61 04/28/21 1137  Temp    Pulse 66 04/28/21 1137  Resp 16 04/28/21 1137  SpO2 100 % 04/28/21 1137    Last Pain:  Vitals:   04/28/21 1137  TempSrc:   PainSc: 0-No pain         Complications: No notable events documented.

## 2021-04-28 NOTE — Anesthesia Procedure Notes (Signed)
Procedure Name: LMA Insertion Date/Time: 04/28/2021 9:15 AM Performed by: Eligha Bridegroom, CRNA Pre-anesthesia Checklist: Patient identified, Emergency Drugs available, Suction available, Patient being monitored and Timeout performed Patient Re-evaluated:Patient Re-evaluated prior to induction Preoxygenation: Pre-oxygenation with 100% oxygen Induction Type: IV induction LMA: LMA inserted LMA Size: 4.0 Number of attempts: 2 Placement Confirmation: positive ETCO2 and breath sounds checked- equal and bilateral Dental Injury: Teeth and Oropharynx as per pre-operative assessment

## 2021-04-28 NOTE — H&P (View-Only) (Signed)
REFERRING PHYSICIAN: Dr. Melanee Spry  PROVIDER: Georgianne Fick, MD  Care Team: Patient Care Team: Marton Redwood as PCP - General   MRN: [id number] DOB: 1941-02-20 DATE OF ENCOUNTER: 03/31/2021  Subjective   Chief Complaint: Right breast cancer   History of Present Illness: Rebecca [name] is a 80 y.o. female who is seen today as an office consultation at the request of Dr. Brigitte Pulse for evaluation of Right breast cancer .   Patient is an 80 year old female referred for a new diagnosis of right breast cancer in August 2022. She had a previous breast cancer and 2004 on the left that was treated with breast conservation, radiation, and tamoxifen. Dr. Annamaria Boots was her surgeon. She did not receive chemotherapy at the time but it was offered.  This time she presented with a palpable mass. She has been getting annual mammograms. It was in the upper outer quadrant on the right. It measured approximately 2.2 cm by ultrasound. This was described as irregular and hypoechoic. A core needle biopsy was performed that demonstrated grade 1-2 invasive mammary carcinoma that was ductal phenotype. This was positive for estrogen and progesterone receptors and Ki-67 was 1%. HER2 was not overexpressed. Due to her breast density and previous breast cancer, an MRI was performed. There was 1.2 cm of non-mass enhancement in the lower outer quadrant on the right. This has not been biopsied.   She has a sister who is an OB/GYN.  03/20/2021 EXAM: DIGITAL DIAGNOSTIC UNILATERAL RIGHT MAMMOGRAM WITH TOMOSYNTHESIS AND CAD; ULTRASOUND RIGHT BREAST LIMITED   TECHNIQUE: Right digital diagnostic mammography and breast tomosynthesis was performed. The images were evaluated with computer-aided detection.; Targeted ultrasound examination of the right breast was performed   COMPARISON: Previous exam(s).   ACR Breast Density Category c: The breast tissue is heterogeneously dense, which may obscure small masses.   FINDINGS: Within  the upper-outer right breast anterior depth there is a persistent irregular mass partially obscured with associated dense surrounding fibroglandular tissue.   On physical exam, dense tissue is palpated upper-outer right breast.   Targeted ultrasound is performed, showing a 1.6 x 1.5 x 2.2 cm irregular hypoechoic mass right breast 10 o'clock position 2 cm from the nipple. No right axillary adenopathy.   IMPRESSION: Suspicious right breast mass 10 o'clock position.   RECOMMENDATION: Ultrasound-guided core needle biopsy right breast mass 10 o'clock position.   I have discussed the findings and recommendations with the patient. If applicable, a reminder letter will be sent to the patient regarding the next appointment.   BI-RADS CATEGORY 5: Highly suggestive of malignancy.    Pathology core needle biopsy: 03/23/2021 Breast, right, needle core biopsy, upper outer, 10 o'clock - INVASIVE MAMMARY CARCINOMA, GRADE 1/2. - MAMMARY CARCINOMA IN SITU.  Receptors: Estrogen Receptor: 100%, POSITIVE, STRONG STAINING INTENSITY Progesterone Receptor: 95%, POSITIVE, STRONG STAINING INTENSITY Proliferation Marker Ki67: 1% GROUP 5: HER2 **NEGATIVE**  MRI breast 03/28/21   IMPRESSION: 1. Biopsy-proven malignancy in the upper-outer right breast measuring 1.2 x 1.5 x 1.5 cm. 2. Indeterminate focal non-mass enhancement in the lower outer right breast measuring 1.2 x 0.4 x 0.3 cm. 3. No MRI evidence of malignancy in the left breast. 4. No suspicious lymphadenopathy.   RECOMMENDATION: MRI guided biopsy of the focal non-mass enhancement in the lower outer right breast.   BI-RADS CATEGORY 4: Suspicious.    Review of Systems: A complete review of systems was obtained from the patient. I have reviewed this information and discussed as appropriate with the patient. See HPI  as well for other ROS.  Review of Systems  Endo/Heme/Allergies: Bruises/bleeds easily.  All other systems reviewed and are  negative.   Medical History: History reviewed. No pertinent past medical history.  Patient Active Problem List  Diagnosis   Malignant neoplasm of upper-outer quadrant of right breast in female, estrogen receptor positive (CMS-HCC)   History of left breast cancer   Abnormal MRI, breast   Past Surgical History:  Procedure Laterality Date   Left Breast Lumpectomy 2004   Left Total Hip Arthroplasty 03/06/2019  Dr. Kathrynn Speed    No Known Allergies  Current Outpatient Medications on File Prior to Visit  Medication Sig Dispense Refill   olmesartan (BENICAR) 20 MG tablet 1 tablet   cholecalciferol (VITAMIN D3) 1000 unit tablet Take by mouth   iron polysaccharides (FERREX) 150 mg iron capsule Take 150 mg by mouth once daily   levothyroxine (SYNTHROID) 75 MCG tablet Take 1 tablet by mouth once daily   No current facility-administered medications on file prior to visit.   Family History  Problem Relation Age of Onset   Skin cancer Father   Breast cancer Sister    Social History   Tobacco Use  Smoking Status Never Smoker  Smokeless Tobacco Never Used    Social History   Socioeconomic History   Marital status: Married  Tobacco Use   Smoking status: Never Smoker   Smokeless tobacco: Never Used  Substance and Sexual Activity   Alcohol use: Yes  Comment: Very Rarely   Drug use: Never   Objective:   Vitals:  03/31/21 1158  BP: 126/80  Pulse: 78  Temp: 36.7 C (98.1 F)  SpO2: 98%  Weight: 60.5 kg (133 lb 6.4 oz)  Height: 167.6 cm ('5\' 6"' )   Body mass index is 21.53 kg/m.  Gen: No acute distress. Well nourished and well groomed.  Neurological: Alert and oriented to person, place, and time. Coordination normal.  Head: Normocephalic and atraumatic.  Eyes: Conjunctivae are normal. Pupils are equal, round, and reactive to light. No scleral icterus.  Neck: Normal range of motion. Neck supple. No tracheal deviation or thyromegaly present.  Cardiovascular: Normal rate,  regular rhythm, normal heart sounds and intact distal pulses. Exam reveals no gallop and no friction rub. No murmur heard. Breast: left breast contracted with chronic thickening LOQ. Nipple deviation to a chronic location per patient. Right breast with vaguely palpable mass at 9 o'clock around 1.5 cm. No nipple retraction or discharge. No palpable LAD. No lymphedema. Bruising on right breast from bx.  Respiratory: Effort normal. No respiratory distress. No chest wall tenderness. Breath sounds normal. No wheezes, rales or rhonchi.  GI: Soft. Bowel sounds are normal. The abdomen is soft and nontender. There is no rebound and no guarding.  Musculoskeletal: Normal range of motion. Extremities are nontender.  Lymphadenopathy: No cervical, preauricular, postauricular or axillary adenopathy is present Skin: Skin is warm and dry. No rash noted. No diaphoresis. No erythema. No pallor. No clubbing, cyanosis, or edema.  Psychiatric: Normal mood and affect. Behavior is normal. Judgment and thought content normal.   Labs None.  Assessment and Plan:   Abnormal MRI, breast Will get MR biopsy right breast LOQ at site of non mass enhancement. If needed, will add seed to this for excision as well.   Malignant neoplasm of upper-outer quadrant of right breast in female, estrogen receptor positive (CMS-HCC) Pt would like to pursue breast conservation. Given that this is a T2, I would plan a sentinel lymph node  biopsy even with good prognostic profile and age of 30. She is in agreement with that and is very anxious about potential spread of disease.  I will plan seed localized lumpectomy and SLN bx after MR biopsy of NME.   She will need referral to oncology and possible to radiation oncology post op.   The surgical procedure was described to the patient. I discussed the incision type and location and that we would need radiology involved on with a wire or seed marker and/or sentinel node.   The risks and  benefits of the procedure were described to the patient and she wishes to proceed.   We discussed the risks bleeding, infection, damage to other structures, need for further procedures/surgeries. We discussed the risk of seroma. The patient was advised if the area in the breast in cancer, we may need to go back to surgery for additional tissue to obtain negative margins or for a lymph node biopsy. The patient was advised that these are the most common complications, but that others can occur as well. They were advised against taking aspirin or other anti-inflammatory agents/blood thinners the week before surgery.   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   No follow-ups on file.  Milus Height, MD FACS Surgical Oncology, General Surgery, Trauma and [facility]

## 2021-04-29 ENCOUNTER — Encounter (HOSPITAL_COMMUNITY): Payer: Self-pay | Admitting: General Surgery

## 2021-04-29 NOTE — Progress Notes (Signed)
Radiation Oncology         [phone] ________________________________  Initial Outpatient Consultation  Name: Rebecca [name] MRN: [id number]  Date: 05/01/2021  DOB: 03/14/1941  NL:GXQJ, Emily Filbert., MD  Stark Klein, MD   REFERRING PHYSICIAN: Stark Klein, MD  DIAGNOSIS: The encounter diagnosis was Malignant neoplasm of upper-outer quadrant of right breast in female, estrogen receptor positive ([place]).  Stage IA (cT1c, cN0, cM0)  Right Breast UOQ, Invasive and in-situ Mammary Carcinoma, ER+ / PR+ / Her2-, Grade 1/2    ICD-10-CM   1. Malignant neoplasm of upper-outer quadrant of right breast in female, estrogen receptor positive ([place])  C50.411    Z17.0       HISTORY OF PRESENT ILLNESS::Rebecca [name] is a 80 y.o. female who is accompanied by her sister. she is seen as a courtesy of Dr. Barry Dienes for an opinion concerning radiation therapy as part of management for her recently diagnosed right breast cancer. (The patient was evaluated in the breast cancer clinic on 04/26/2021 by Dr. Jana Hakim).   She had routine screening mammography on 03/09/21 showing a possible abnormality in the right breast. She underwent bilateral diagnostic mammography with tomography and right breast ultrasonography at [facility] on 03/20/21 showing a suspicious right breast mass 10 o'clock position.  Right breast needle core biopsy at the upper outer 10 o'clock position on 03/23/21 showed: invasive mammary carcinoma, measuring 0.9 cm in the greatest dimension; and mammary carcinoma in-situ. Prognostic indicators significant for: estrogen receptor, 100% positive and progesterone receptor, 95% positive, both with strong staining intensity. Proliferation marker Ki67 at 1%. HER2 negative.  E-cadherin testing showed this was a invasive ductal breast cancer.  Bilateral breast MRI performed on 03/28/21 demonstrated the biopsy proven malignancy in the upper-outer right breast to measure 1.2 x 1.5 x 1.5 cm, as well  as an indeterminate focal non-mass enhancement in the lower outer right breast to measure 1.2 x 0.4 x 0.3 cm.  Findings of a focal non-mass enhancement in the lower outer right breast prompted an additional biopsy collected on 04/13/21 of the lower outer right breast. Pathology revealed increased stromal fibrosis with fibrocystic changes, duct, ectasia, and calcifications. No malignancy was identified. Right breast lateral-mid depth biopsy also collected revealed stromal fibrosis with lobular neoplasia and adenosis; no malignancy identified.   Subsequently, the patient underwent right breast lumpectomy x 2 with sentinel lymph node biopsies on 04/28/21 under the care of Dr. Barry Dienes. Pathology from the procedure is pending at this time since her surgery was performed last Friday.    PREVIOUS RADIATION THERAPY: Yes, history of stage IA (pT1c, pNX) left breast cancer (ER& PR+, G1), status post lumpectomy 07/08/2016, adjuvant radiation therapy, and 5 years of Femara under the care of Dr. Truddie Coco.  The patient received 50.4 Pearline Cables directed to the left breast.  The site of presentation in the upper inner aspect of the left breast was boosted further to a cumulative dose of 63 Gray.  Radiation treatments extending from September 12, 2006 through October 31, 2006.  She reports tolerating the radiation well without any long-term effects.  PAST MEDICAL HISTORY:  Past Medical History:  Diagnosis Date   Anemia    Breast CA ([place])    Left   GERD (gastroesophageal reflux disease)    History of cataract    History of hiatal hernia    Hypertension 2022   Hypothyroidism    OA (osteoarthritis)    Personal history of radiation therapy    PONV (postoperative  nausea and vomiting)    Vitamin D deficiency     PAST SURGICAL HISTORY: Past Surgical History:  Procedure Laterality Date   BREAST EXCISIONAL BIOPSY Left    BREAST LUMPECTOMY Left    BREAST LUMPECTOMY WITH RADIOACTIVE SEED AND SENTINEL LYMPH NODE BIOPSY Right  04/28/2021   Procedure: RIGHT BREAST LUMPECTOMY WITH RADIOACTIVE SEED x 2 AND SENTINEL LYMPH NODE BIOPSY;  Surgeon: Stark Klein, MD;  Location: [facility];  Service: General;  Laterality: Right;   CATARACT EXTRACTION, BILATERAL     COLONOSCOPY  01/03/2007   TOTAL HIP ARTHROPLASTY Left 03/06/2019   Procedure: LEFT TOTAL HIP ARTHROPLASTY ANTERIOR APPROACH;  Surgeon: Mcarthur Rossetti, MD;  Location: [facility];  Service: Orthopedics;  Laterality: Left;   UPPER GI ENDOSCOPY  09/23/2007    FAMILY HISTORY: The patient's father had a history of renal cancer, subsequently dying in his 10s from other causes.  The patient's mother also died at an advanced age.  1 of 2 maternal aunts had bladder cancer and 1 maternal cousin had breast cancer.  The patient has 1 brother, 4 sisters.  1 sister died from renal cancer, 1 sister Rebecca [name]) has a history of breast cancer Family History  Problem Relation Age of Onset   Breast cancer Sister     SOCIAL HISTORY: Her (adopted) children are Rebecca [name], 45, who lives in [place] is a Pharmacist, hospital in middle school and has 5 children; and Rebecca [name], who has ADHD and bipolar issues is age 71 and lives with the patient Social History   Socioeconomic History   Marital status: Married    Spouse name: Not on file   Number of children: Not on file   Years of education: Not on file   Highest education level: Not on file  Occupational History   Not on file  Tobacco Use   Smoking status: Never   Smokeless tobacco: Never  Vaping Use   Vaping Use: Never used  Substance and Sexual Activity   Alcohol use: Yes    Comment: maybe once a month   Drug use: Never   Sexual activity: Not on file  Other Topics Concern   Not on file  Social History Narrative   Not on file   Social Determinants of Health   Financial Resource Strain: Not on file  Food Insecurity: Not on file  Transportation Needs: Not on file  Physical Activity: Not on file  Stress: Not on file  Social  Connections: Not on file    ALLERGIES: No Known Allergies  MEDICATIONS:  Current Outpatient Medications  Medication Sig Dispense Refill   B Complex-C (SUPER B COMPLEX PO) Take 1 tablet by mouth daily.     calcium carbonate (TUMS - DOSED IN MG ELEMENTAL CALCIUM) 500 MG chewable tablet Chew 2 tablets by mouth 3 (three) times daily as needed for indigestion or heartburn.     cholecalciferol (VITAMIN D3) 25 MCG (1000 UT) tablet Take 1,000 Units by mouth daily.     Docusate Calcium (STOOL SOFTENER PO) Take 3 tablets by mouth as needed.     glycerin adult 2 g suppository Place 1 suppository rectally as needed for constipation.     levothyroxine (SYNTHROID) 75 MCG tablet Take 75 mcg by mouth daily before breakfast.      magnesium oxide (MAG-OX) 400 MG tablet Take 400 mg by mouth daily as needed (cramps).     olmesartan (BENICAR) 20 MG tablet Take 20 mg by mouth daily.     omeprazole (  PRILOSEC) 20 MG capsule Take 20 mg by mouth daily as needed (acid reflux).     POLY-IRON 150 150 MG capsule Take 150 mg by mouth daily.      polyethylene glycol (MIRALAX / GLYCOLAX) 17 g packet Take 17 g by mouth as needed.     oxyCODONE (OXY IR/ROXICODONE) 5 MG immediate release tablet Take 0.5-1 tablets (2.5-5 mg total) by mouth every 6 (six) hours as needed for severe pain. (Patient not taking: Reported on 05/01/2021) 5 tablet 0   No current facility-administered medications for this encounter.    REVIEW OF SYSTEMS: A 10+ POINT REVIEW OF SYSTEMS WAS OBTAINED including neurology, dermatology, psychiatry, cardiac, respiratory, lymph, extremities, GI, GU, musculoskeletal, constitutional, reproductive, HEENT.  The patient reports doing well since her surgery late last week.  She denies any pain and did not take any prescription pain medication after her surgery.  She denies any obvious swelling in the right arm or hand or numbness.  Prior to her surgery she denied any pain within the breast area nipple discharge or  bleeding.   PHYSICAL EXAM:  height is '5\' 6"'  (1.676 m) and weight is 128 lb 9.6 oz (58.3 kg). Her temporal temperature is 96.8 F (36 C) (abnormal). Her blood pressure is 137/71 and her pulse is 67. Her respiration is 18 and oxygen saturation is 100%.   General: Alert and oriented, in no acute distress HEENT: Head is normocephalic. Extraocular movements are intact.  Neck: Neck is supple, no palpable cervical or supraclavicular lymphadenopathy. Heart: Regular in rate and rhythm with no murmurs, rubs, or gallops. Chest: Clear to auscultation bilaterally, with no rhonchi, wheezes, or rales. Abdomen: Soft, nontender, nondistended, with no rigidity or guarding. Extremities: No cyanosis or edema. Lymphatics: see Neck Exam Skin: No concerning lesions. Musculoskeletal: symmetric strength and muscle tone throughout. Neurologic: Cranial nerves II through XII are grossly intact. No obvious focalities. Speech is fluent. Coordination is intact. Psychiatric: Judgment and insight are intact. Affect is appropriate. The patient has a compressive dressing around the chest region and she did not wish to have this removed in light of her recent surgery.  KPS = 90  100 - Normal; no complaints; no evidence of disease. 90   - Able to carry on normal activity; minor signs or symptoms of disease. 80   - Normal activity with effort; some signs or symptoms of disease. 52   - Cares for self; unable to carry on normal activity or to do active work. 60   - Requires occasional assistance, but is able to care for most of his personal needs. 50   - Requires considerable assistance and frequent medical care. 108   - Disabled; requires special care and assistance. 63   - Severely disabled; hospital admission is indicated although death not imminent. 29   - Very sick; hospital admission necessary; active supportive treatment necessary. 10   - Moribund; fatal processes progressing rapidly. 0     - Dead  Karnofsky DA,  Abelmann [place], Craver LS and Burchenal [facility] [phone]) The use of the nitrogen mustards in the palliative treatment of carcinoma: with particular reference to bronchogenic carcinoma Cancer 1 634-56  LABORATORY DATA:  Lab Results  Component Value Date   WBC 5.4 04/19/2021   HGB 12.6 04/19/2021   HCT 38.8 04/19/2021   MCV 93.9 04/19/2021   PLT 284 04/19/2021   Lab Results  Component Value Date   NA 139 04/19/2021   K 4.0 04/19/2021   CL 107 04/19/2021  CO2 26 04/19/2021   Lab Results  Component Value Date   ALT 30 07/10/2011   AST 35 07/10/2011   ALKPHOS 79 07/10/2011   BILITOT 0.3 07/10/2011    PULMONARY FUNCTION TEST:   Recent Review Flowsheet Data   There is no flowsheet data to display.     RADIOGRAPHY: NM Sentinel Node Inj-No Rpt (Breast)  Result Date: 04/28/2021 Sulfur Colloid was injected by the Nuclear Medicine Technologist for sentinel lymph node localization.   MM Breast Surgical Specimen  Result Date: 04/28/2021 CLINICAL DATA:  Evaluate surgical specimen following RIGHT lumpectomy for RIGHT breast cancer and lobular neoplasia. EXAM: SPECIMEN RADIOGRAPH OF THE RIGHT BREAST COMPARISON:  Previous exam(s). FINDINGS: Status post excision of the RIGHT breast. The 2 radioactive seeds, RIBBON biopsy marker clip, and BARBELL biopsy marker clip are present, completely intact, and were marked for pathology. IMPRESSION: Specimen radiograph of the RIGHT breast. Electronically Signed   By: Margarette Canada M.D.   On: 04/28/2021 10:26  MM RT RADIOACTIVE SEED LOC MAMMO GUIDE  Result Date: 04/27/2021 CLINICAL DATA:  Localization of the ribbon shaped clip and the dumbbell shaped clip prior to surgery. EXAM: MAMMOGRAPHIC GUIDED RADIOACTIVE SEED LOCALIZATION OF THE RIGHT BREAST COMPARISON:  Previous exam(s). FINDINGS: Patient presents for radioactive seed localization prior to surgery. I met with the patient and we discussed the procedure of seed localization including benefits and alternatives.  We discussed the high likelihood of a successful procedure. We discussed the risks of the procedure including infection, bleeding, tissue injury and further surgery. We discussed the low dose of radioactivity involved in the procedure. Informed, written consent was given. The usual time-out protocol was performed immediately prior to the procedure. Using mammographic guidance, sterile technique, 1% lidocaine and an I-125 radioactive seed, the ribbon shaped clip was localized using a lateral approach. The follow-up mammogram images confirm the seed in the expected location and were marked for the surgeon. Follow-up survey of the patient confirms presence of the radioactive seed. Order number of I-125 seed:  [id number]. Total activity:  2.878 millicurie reference Date: April 04, 2021 Using mammographic guidance, sterile technique, 1% lidocaine and an I-125 radioactive seed, the dumbbell shaped clip was localized using a lateral approach. The follow-up mammogram images confirm the seed in the expected location and were marked for the surgeon. Follow-up survey of the patient confirms presence of the radioactive seed. Order number of I-125 seed:  [id number]. Total activity:  0.962 millicuries reference Date: April 04, 2021 The patient tolerated the procedure well and was released from the [facility]. She was given instructions regarding seed removal. IMPRESSION: Radioactive seed localization right breast. No apparent complications. Electronically Signed   By: Dorise Bullion III M.D.   On: 04/27/2021 18:12  MM RT RADIO SEED EA ADD LESION LOC MAMMO  Result Date: 04/27/2021 CLINICAL DATA:  Localization of the ribbon shaped clip and the dumbbell shaped clip prior to surgery. EXAM: MAMMOGRAPHIC GUIDED RADIOACTIVE SEED LOCALIZATION OF THE RIGHT BREAST COMPARISON:  Previous exam(s). FINDINGS: Patient presents for radioactive seed localization prior to surgery. I met with the patient and we discussed the procedure of  seed localization including benefits and alternatives. We discussed the high likelihood of a successful procedure. We discussed the risks of the procedure including infection, bleeding, tissue injury and further surgery. We discussed the low dose of radioactivity involved in the procedure. Informed, written consent was given. The usual time-out protocol was performed immediately prior to the procedure. Using mammographic guidance, sterile technique, 1% lidocaine  and an I-125 radioactive seed, the ribbon shaped clip was localized using a lateral approach. The follow-up mammogram images confirm the seed in the expected location and were marked for the surgeon. Follow-up survey of the patient confirms presence of the radioactive seed. Order number of I-125 seed:  [id number]. Total activity:  3.235 millicurie reference Date: April 04, 2021 Using mammographic guidance, sterile technique, 1% lidocaine and an I-125 radioactive seed, the dumbbell shaped clip was localized using a lateral approach. The follow-up mammogram images confirm the seed in the expected location and were marked for the surgeon. Follow-up survey of the patient confirms presence of the radioactive seed. Order number of I-125 seed:  [id number]. Total activity:  2.706 millicuries reference Date: April 04, 2021 The patient tolerated the procedure well and was released from the [facility]. She was given instructions regarding seed removal. IMPRESSION: Radioactive seed localization right breast. No apparent complications. Electronically Signed   By: Dorise Bullion III M.D.   On: 04/27/2021 18:12  MM CLIP PLACEMENT RIGHT  Addendum Date: 04/21/2021   ADDENDUM REPORT: 04/21/2021 15:56 ADDENDUM: Request is made for review of pathology and imaging findings prior to seed localization of the RIGHT breast. Ultrasound-guided core biopsy of mass in the UPPER OUTER QUADRANT of the RIGHT breast was marked with a ribbon shaped clip. Biopsy showed invasive and in  situ mammary carcinoma. MR guided core biopsy of mass in the LATERAL middle depth of the RIGHT breast is marked with a barbell shaped clip and showed concordant lobular neoplasia (atypical lobular hyperplasia). Excision is recommended. MR guided core biopsy of non mass enhancement in the OUTER RIGHT breast is marked with a cylinder-shaped clip and showed benign concordant stromal fibrosis with fibrocystic changes and duct ectasia. Given these findings, recommend placement of 2 radioactive seeds, one at the at the ribbon clip and another at the barbell shaped clip. Electronically Signed   By: Nolon Nations M.D.   On: 04/21/2021 15:56   Result Date: 04/21/2021 CLINICAL DATA:  Post biopsy mammogram of the right breast for clip placement. EXAM: 3D DIAGNOSTIC RIGHT MAMMOGRAM POST MRI BIOPSY COMPARISON:  Previous exam(s). FINDINGS: 3D Mammographic images were obtained following MRI guided biopsy of non mass enhancement in the lower outer right breast, and an enhancing mass in the lateral mid depth of the right breast. The biopsy marking clips are in expected position at the site sites of biopsy. IMPRESSION: 1. Appropriate positioning of the cylinder shaped biopsy marking clip at the site of biopsy in the lower outer right breast. 2. Appropriate positioning of the barbell shaped biopsy marking clip in the lateral mid to anterior depth of the right breast. Final Assessment: Post Procedure Mammograms for Marker Placement Electronically Signed: By: Ammie Ferrier M.D. On: 04/13/2021 11:33   MR RT BREAST BX W LOC DEV 1ST LESION IMAGE BX SPEC MR GUIDE  Addendum Date: 04/14/2021   ADDENDUM REPORT: 04/14/2021 12:57 ADDENDUM: Pathology revealed INCREASED STROMAL FIBROSIS WITH FIBROCYSTIC CHANGES, DUCT ECTASIA AND CALCIFICATIONS of the Right breast, lower outer, (cylinder clip). This was found to be concordant by Dr. Ammie Ferrier. Pathology revealed STROMAL FIBROSIS WITH LOBULAR NEOPLASIA (ATYPICAL LOBULAR  HYPERPLASIA) AND ADENOSIS of the Right breast, lateral mid depth, (barbell clip). This was found to be concordant by Dr. Ammie Ferrier, with excision recommended. Pathology results were discussed with the patient by telephone. The patient reported doing well after the biopsies with tenderness and bruising at the sites. Post biopsy instructions and care were reviewed and questions were  answered. The patient was encouraged to call [facility] for any additional concerns. My direct phone number was provided. The patient has a recent diagnosis of Right breast cancer and should follow her outlined treatment plan. Pathology results were sent to Dr. Barry Dienes and Bary Castilla, RN Oncology Navigator via [facility] message on April 14, 2021. Pathology results reported by Terie Purser, RN on 04/14/2021. Electronically Signed   By: Ammie Ferrier M.D.   On: 04/14/2021 12:57   Result Date: 04/14/2021 CLINICAL DATA:  80 year old female presenting for MRI guided biopsy of non mass enhancement in the lower outer right breast. During the procedure, a 6 mm enhancing mass was seen anterior to the biopsy proven cancer in the right breast, and therefore this lesion was biopsied as well. EXAM: MRI GUIDED CORE NEEDLE BIOPSY OF THE RIGHT BREAST TECHNIQUE: Multiplanar, multisequence MR imaging of the right breast was performed both before and after administration of intravenous contrast. CONTRAST:  9m GADAVIST GADOBUTROL 1 MMOL/ML IV SOLN COMPARISON:  Previous exams. FINDINGS: I met with the patient, and we discussed the procedure of MRI guided biopsy, including risks, benefits, and alternatives. Specifically, we discussed the risks of infection, bleeding, tissue injury, clip migration, and inadequate sampling. Informed, written consent was given. The usual time out protocol was performed immediately prior to the procedure. Using sterile technique, 1% Lidocaine, MRI guidance, and a 9 gauge vacuum assisted device,  biopsy was performed of non mass enhancement in the lower outer right breast using a lateral approach. At the conclusion of the procedure, a cylinder shaped tissue marker clip was deployed into the biopsy cavity. Using sterile technique, 1% Lidocaine, MRI guidance, and a 9 gauge vacuum assisted device, biopsy was performed of a 6 mm enhancing mass in the lateral middle depth of the right breast using a lateral approach. At the conclusion of the procedure, a barbell shaped tissue marker clip was deployed into the biopsy cavity. Follow-up 2-view mammogram was performed and dictated separately. IMPRESSION: 1. MRI guided biopsy of non mass enhancement in the lower outer right breast. No apparent complications. 2. MRI guided biopsy of a 6 mm enhancing mass in the lateral middle depth of the right breast. No apparent complications. Electronically Signed: By: MAmmie FerrierM.D. On: 04/13/2021 11:31  MR RT BREAST BX W LOC DEV EA ADD LESION IMAGE BX SPEC MR GUIDE  Addendum Date: 04/14/2021   ADDENDUM REPORT: 04/14/2021 12:57 ADDENDUM: Pathology revealed INCREASED STROMAL FIBROSIS WITH FIBROCYSTIC CHANGES, DUCT ECTASIA AND CALCIFICATIONS of the Right breast, lower outer, (cylinder clip). This was found to be concordant by Dr. MAmmie Ferrier Pathology revealed STROMAL FIBROSIS WITH LOBULAR NEOPLASIA (ATYPICAL LOBULAR HYPERPLASIA) AND ADENOSIS of the Right breast, lateral mid depth, (barbell clip). This was found to be concordant by Dr. MAmmie Ferrier with excision recommended. Pathology results were discussed with the patient by telephone. The patient reported doing well after the biopsies with tenderness and bruising at the sites. Post biopsy instructions and care were reviewed and questions were answered. [facility] My direct phone number was provided. The patient has a recent diagnosis of Right breast cancer and should  follow her outlined treatment plan. Pathology results were sent to Dr. BBarry Dienesand DBary Castilla RN Oncology Navigator [facility] on April 14, 2021. Pathology results reported by LTerie Purser RN on 04/14/2021. Electronically Signed   By: MAmmie FerrierM.D.   On: 04/14/2021  12:57   Result Date: 04/14/2021 CLINICAL DATA:  80 year old female presenting for MRI guided biopsy of non mass enhancement in the lower outer right breast. During the procedure, a 6 mm enhancing mass was seen anterior to the biopsy proven cancer in the right breast, and therefore this lesion was biopsied as well. EXAM: MRI GUIDED CORE NEEDLE BIOPSY OF THE RIGHT BREAST TECHNIQUE: Multiplanar, multisequence MR imaging of the right breast was performed both before and after administration of intravenous contrast. CONTRAST:  69m GADAVIST GADOBUTROL 1 MMOL/ML IV SOLN COMPARISON:  Previous exams. FINDINGS: I met with the patient, and we discussed the procedure of MRI guided biopsy, including risks, benefits, and alternatives. Specifically, we discussed the risks of infection, bleeding, tissue injury, clip migration, and inadequate sampling. Informed, written consent was given. The usual time out protocol was performed immediately prior to the procedure. Using sterile technique, 1% Lidocaine, MRI guidance, and a 9 gauge vacuum assisted device, biopsy was performed of non mass enhancement in the lower outer right breast using a lateral approach. At the conclusion of the procedure, a cylinder shaped tissue marker clip was deployed into the biopsy cavity. Using sterile technique, 1% Lidocaine, MRI guidance, and a 9 gauge vacuum assisted device, biopsy was performed of a 6 mm enhancing mass in the lateral middle depth of the right breast using a lateral approach. At the conclusion of the procedure, a barbell shaped tissue marker clip was deployed into the biopsy cavity. Follow-up 2-view mammogram was performed and dictated separately. IMPRESSION:  1. MRI guided biopsy of non mass enhancement in the lower outer right breast. No apparent complications. 2. MRI guided biopsy of a 6 mm enhancing mass in the lateral middle depth of the right breast. No apparent complications. Electronically Signed: By: MAmmie FerrierM.D. On: 04/13/2021 11:31     IMPRESSION: Stage IA (cT1c, cN0, cM0)  Right Breast UOQ, Invasive and in-situ Mammary Carcinoma, ER+ / PR+ / Her2-, Grade 1/2   Results from the patient surgery late last week are pending at this time.  Clinically she seems to be doing well in light of her recent surgery.  We discussed indications for adjuvant radiation therapy in detail.  Discussed with the patient that she will likely require conventional radiation therapy in light of her left breast radiation treatments and potential for overlap of the sternum.  We also discussed that we may need to potentially cover the axillary region if her sentinel node procedure shows spread to this area.  Final treatment plans pending results of the patient's pathology from late last week.   PLAN:  The patient will be scheduled for re-evaluation and 4 weeks with simulation plan at that time.  If patient is found to have a small tumor without spread to the axillary region, clear surgical margins and she agrees to proceed with adjuvant hormonal therapy,  then she may potentially be able to avoid radiation therapy.   ------------------------------------------------  JBlair Promise PhD, MD  This document serves as a record of services personally performed by JGery Pray MD. It was created on his behalf by ERoney Mans a trained medical scribe. The creation of this record is based on the scribe's personal observations and the provider's statements to them. This document has been checked and approved by the attending provider.

## 2021-05-01 ENCOUNTER — Other Ambulatory Visit: Payer: Self-pay

## 2021-05-01 ENCOUNTER — Encounter: Payer: Self-pay | Admitting: Radiation Oncology

## 2021-05-01 ENCOUNTER — Ambulatory Visit
Admission: RE | Admit: 2021-05-01 | Discharge: 2021-05-01 | Disposition: A | Discharge status: Home / Self Care | Payer: Medicare PPO | Source: Ambulatory Visit | Attending: Radiation Oncology | Admitting: Radiation Oncology

## 2021-05-01 ENCOUNTER — Inpatient Hospital Stay: Payer: Medicare PPO

## 2021-05-01 VITALS — BP 137/71 | HR 67 | Temp 96.8°F | Resp 18 | Ht 66.0 in | Wt 128.6 lb

## 2021-05-01 DIAGNOSIS — C50411 Malignant neoplasm of upper-outer quadrant of right female breast: Secondary | ICD-10-CM | POA: Diagnosis not present

## 2021-05-01 DIAGNOSIS — Z8051 Family history of malignant neoplasm of kidney: Secondary | ICD-10-CM | POA: Insufficient documentation

## 2021-05-01 DIAGNOSIS — Z170 Estrogen receptor positive status [ER+]: Secondary | ICD-10-CM

## 2021-05-01 DIAGNOSIS — E039 Hypothyroidism, unspecified: Secondary | ICD-10-CM | POA: Diagnosis not present

## 2021-05-01 DIAGNOSIS — Z923 Personal history of irradiation: Secondary | ICD-10-CM | POA: Diagnosis not present

## 2021-05-01 DIAGNOSIS — Z79811 Long term (current) use of aromatase inhibitors: Secondary | ICD-10-CM | POA: Diagnosis not present

## 2021-05-01 DIAGNOSIS — M1990 Unspecified osteoarthritis, unspecified site: Secondary | ICD-10-CM | POA: Insufficient documentation

## 2021-05-01 DIAGNOSIS — Z79899 Other long term (current) drug therapy: Secondary | ICD-10-CM | POA: Insufficient documentation

## 2021-05-01 DIAGNOSIS — Z803 Family history of malignant neoplasm of breast: Secondary | ICD-10-CM | POA: Diagnosis not present

## 2021-05-01 DIAGNOSIS — I10 Essential (primary) hypertension: Secondary | ICD-10-CM | POA: Diagnosis not present

## 2021-05-01 DIAGNOSIS — Z8052 Family history of malignant neoplasm of bladder: Secondary | ICD-10-CM | POA: Insufficient documentation

## 2021-05-01 DIAGNOSIS — E559 Vitamin D deficiency, unspecified: Secondary | ICD-10-CM | POA: Insufficient documentation

## 2021-05-01 DIAGNOSIS — K219 Gastro-esophageal reflux disease without esophagitis: Secondary | ICD-10-CM | POA: Insufficient documentation

## 2021-05-01 NOTE — Progress Notes (Signed)
See MD note for nursing evaluation. °

## 2021-05-02 LAB — SURGICAL PATHOLOGY

## 2021-05-03 ENCOUNTER — Encounter: Payer: Self-pay | Admitting: *Deleted

## 2021-05-04 ENCOUNTER — Other Ambulatory Visit: Payer: Self-pay | Admitting: General Surgery

## 2021-05-04 ENCOUNTER — Telehealth: Payer: Self-pay | Admitting: General Surgery

## 2021-05-04 NOTE — Telephone Encounter (Signed)
Left message on home and mobile numbers regarding pathology.  Will need reexcision.

## 2021-05-08 ENCOUNTER — Other Ambulatory Visit: Payer: Self-pay | Admitting: Oncology

## 2021-05-08 ENCOUNTER — Inpatient Hospital Stay: Payer: Medicare PPO

## 2021-05-08 ENCOUNTER — Encounter (HOSPITAL_BASED_OUTPATIENT_CLINIC_OR_DEPARTMENT_OTHER): Payer: Self-pay | Admitting: General Surgery

## 2021-05-08 ENCOUNTER — Other Ambulatory Visit: Payer: Self-pay

## 2021-05-08 DIAGNOSIS — C50411 Malignant neoplasm of upper-outer quadrant of right female breast: Secondary | ICD-10-CM

## 2021-05-08 DIAGNOSIS — Z170 Estrogen receptor positive status [ER+]: Secondary | ICD-10-CM

## 2021-05-08 LAB — GENETIC SCREENING ORDER

## 2021-05-09 DIAGNOSIS — M8580 Other specified disorders of bone density and structure, unspecified site: Secondary | ICD-10-CM | POA: Insufficient documentation

## 2021-05-09 DIAGNOSIS — G5601 Carpal tunnel syndrome, right upper limb: Secondary | ICD-10-CM | POA: Insufficient documentation

## 2021-05-10 ENCOUNTER — Encounter: Payer: Self-pay | Admitting: *Deleted

## 2021-05-15 ENCOUNTER — Ambulatory Visit (HOSPITAL_BASED_OUTPATIENT_CLINIC_OR_DEPARTMENT_OTHER): Payer: Medicare PPO | Admitting: Certified Registered Nurse Anesthetist

## 2021-05-15 ENCOUNTER — Encounter (HOSPITAL_BASED_OUTPATIENT_CLINIC_OR_DEPARTMENT_OTHER): Payer: Self-pay | Admitting: General Surgery

## 2021-05-15 ENCOUNTER — Other Ambulatory Visit: Payer: Self-pay

## 2021-05-15 ENCOUNTER — Encounter (HOSPITAL_BASED_OUTPATIENT_CLINIC_OR_DEPARTMENT_OTHER)
Admission: RE | Disposition: A | Discharge status: Home / Self Care | Payer: Self-pay | Source: Home / Self Care | Attending: General Surgery

## 2021-05-15 ENCOUNTER — Ambulatory Visit (HOSPITAL_BASED_OUTPATIENT_CLINIC_OR_DEPARTMENT_OTHER)
Admission: RE | Admit: 2021-05-15 | Discharge: 2021-05-15 | Disposition: A | Discharge status: Home / Self Care | Payer: Medicare PPO | Attending: General Surgery | Admitting: General Surgery

## 2021-05-15 DIAGNOSIS — Z808 Family history of malignant neoplasm of other organs or systems: Secondary | ICD-10-CM | POA: Diagnosis not present

## 2021-05-15 DIAGNOSIS — K121 Other forms of stomatitis: Secondary | ICD-10-CM | POA: Diagnosis not present

## 2021-05-15 DIAGNOSIS — Z853 Personal history of malignant neoplasm of breast: Secondary | ICD-10-CM | POA: Diagnosis not present

## 2021-05-15 DIAGNOSIS — N6011 Diffuse cystic mastopathy of right breast: Secondary | ICD-10-CM | POA: Diagnosis not present

## 2021-05-15 DIAGNOSIS — C50411 Malignant neoplasm of upper-outer quadrant of right female breast: Secondary | ICD-10-CM | POA: Diagnosis not present

## 2021-05-15 DIAGNOSIS — Z803 Family history of malignant neoplasm of breast: Secondary | ICD-10-CM | POA: Insufficient documentation

## 2021-05-15 DIAGNOSIS — Z923 Personal history of irradiation: Secondary | ICD-10-CM | POA: Insufficient documentation

## 2021-05-15 DIAGNOSIS — D630 Anemia in neoplastic disease: Secondary | ICD-10-CM | POA: Diagnosis not present

## 2021-05-15 DIAGNOSIS — N6091 Unspecified benign mammary dysplasia of right breast: Secondary | ICD-10-CM | POA: Diagnosis not present

## 2021-05-15 DIAGNOSIS — E559 Vitamin D deficiency, unspecified: Secondary | ICD-10-CM | POA: Diagnosis not present

## 2021-05-15 DIAGNOSIS — Z170 Estrogen receptor positive status [ER+]: Secondary | ICD-10-CM | POA: Diagnosis not present

## 2021-05-15 HISTORY — PX: RE-EXCISION OF BREAST LUMPECTOMY: SHX6048

## 2021-05-15 SURGERY — EXCISION, LESION, BREAST
Anesthesia: General | Site: Breast | Laterality: Right

## 2021-05-15 MED ORDER — LIDOCAINE 2% (20 MG/ML) 5 ML SYRINGE
INTRAMUSCULAR | Status: DC | PRN
  Administered 2021-05-15: 60 mg via INTRAVENOUS

## 2021-05-15 MED ORDER — CHLORHEXIDINE GLUCONATE CLOTH 2 % EX PADS: 6.0000 | MEDICATED_PAD | Freq: Once | CUTANEOUS | Status: DC

## 2021-05-15 MED ORDER — ONDANSETRON HCL 4 MG/2ML IJ SOLN
INTRAMUSCULAR | Status: AC
  Filled 2021-05-15: qty 2

## 2021-05-15 MED ORDER — DEXAMETHASONE SODIUM PHOSPHATE 10 MG/ML IJ SOLN
INTRAMUSCULAR | Status: AC
  Filled 2021-05-15: qty 1

## 2021-05-15 MED ORDER — EPHEDRINE SULFATE-NACL 50-0.9 MG/10ML-% IV SOSY
PREFILLED_SYRINGE | INTRAVENOUS | Status: DC | PRN
  Administered 2021-05-15 (×4): 5 mg via INTRAVENOUS

## 2021-05-15 MED ORDER — OXYCODONE HCL 5 MG/5ML PO SOLN: 5.0000 mg | Freq: Once | ORAL | Status: DC | PRN

## 2021-05-15 MED ORDER — CEFAZOLIN SODIUM-DEXTROSE 2-4 GM/100ML-% IV SOLN
INTRAVENOUS | Status: AC
  Filled 2021-05-15: qty 100

## 2021-05-15 MED ORDER — FENTANYL CITRATE (PF) 100 MCG/2ML IJ SOLN
INTRAMUSCULAR | Status: DC | PRN
  Administered 2021-05-15 (×2): 25 ug via INTRAVENOUS

## 2021-05-15 MED ORDER — PROPOFOL 10 MG/ML IV BOLUS
INTRAVENOUS | Status: DC | PRN
  Administered 2021-05-15: 150 mg via INTRAVENOUS

## 2021-05-15 MED ORDER — PROMETHAZINE HCL 25 MG/ML IJ SOLN: 6.2500 mg | INTRAMUSCULAR | Status: DC | PRN

## 2021-05-15 MED ORDER — LACTATED RINGERS IV SOLN: INTRAVENOUS | Status: DC | PRN

## 2021-05-15 MED ORDER — EPHEDRINE 5 MG/ML INJ
INTRAVENOUS | Status: AC
  Filled 2021-05-15: qty 5

## 2021-05-15 MED ORDER — CEFAZOLIN SODIUM-DEXTROSE 2-4 GM/100ML-% IV SOLN
2.0000 g | INTRAVENOUS | Status: AC
Start: 2021-05-15 — End: 2021-05-15
  Administered 2021-05-15: 2 g via INTRAVENOUS

## 2021-05-15 MED ORDER — DROPERIDOL 2.5 MG/ML IJ SOLN
INTRAMUSCULAR | Status: AC
  Filled 2021-05-15: qty 2

## 2021-05-15 MED ORDER — PROPOFOL 10 MG/ML IV BOLUS
INTRAVENOUS | Status: AC
  Filled 2021-05-15: qty 40

## 2021-05-15 MED ORDER — FENTANYL CITRATE (PF) 100 MCG/2ML IJ SOLN
INTRAMUSCULAR | Status: AC
  Filled 2021-05-15: qty 2

## 2021-05-15 MED ORDER — HYDROMORPHONE HCL 1 MG/ML IJ SOLN: 0.2500 mg | INTRAMUSCULAR | Status: DC | PRN

## 2021-05-15 MED ORDER — ONDANSETRON HCL 4 MG/2ML IJ SOLN
INTRAMUSCULAR | Status: DC | PRN
  Administered 2021-05-15: 4 mg via INTRAVENOUS

## 2021-05-15 MED ORDER — DEXAMETHASONE SODIUM PHOSPHATE 10 MG/ML IJ SOLN
INTRAMUSCULAR | Status: DC | PRN
  Administered 2021-05-15: 5 mg via INTRAVENOUS

## 2021-05-15 MED ORDER — DROPERIDOL 2.5 MG/ML IJ SOLN
INTRAMUSCULAR | Status: DC | PRN
  Administered 2021-05-15: .625 mg via INTRAVENOUS

## 2021-05-15 MED ORDER — OXYCODONE HCL 5 MG PO TABS: 5.0000 mg | ORAL_TABLET | Freq: Once | ORAL | Status: DC | PRN

## 2021-05-15 MED ORDER — LACTATED RINGERS IV SOLN: INTRAVENOUS | Status: DC

## 2021-05-15 MED ORDER — LIDOCAINE 2% (20 MG/ML) 5 ML SYRINGE
INTRAMUSCULAR | Status: AC
  Filled 2021-05-15: qty 10

## 2021-05-15 MED ORDER — LIDOCAINE-EPINEPHRINE (PF) 1 %-1:200000 IJ SOLN
INTRAMUSCULAR | Status: DC | PRN
  Administered 2021-05-15: 30 mL via INTRAMUSCULAR

## 2021-05-15 SURGICAL SUPPLY — 50 items
ADH SKN CLS APL DERMABOND .7 (GAUZE/BANDAGES/DRESSINGS) ×1
APL PRP STRL LF DISP 70% ISPRP (MISCELLANEOUS) ×1
BINDER BREAST 3XL (GAUZE/BANDAGES/DRESSINGS) IMPLANT
BINDER BREAST LRG (GAUZE/BANDAGES/DRESSINGS) IMPLANT
BINDER BREAST MEDIUM (GAUZE/BANDAGES/DRESSINGS) ×2 IMPLANT
BINDER BREAST XLRG (GAUZE/BANDAGES/DRESSINGS) IMPLANT
BINDER BREAST XXLRG (GAUZE/BANDAGES/DRESSINGS) IMPLANT
BLADE SURG 10 STRL SS (BLADE) ×2 IMPLANT
BLADE SURG 15 STRL LF DISP TIS (BLADE) IMPLANT
BLADE SURG 15 STRL SS (BLADE)
CANISTER SUCT 1200ML W/VALVE (MISCELLANEOUS) ×2 IMPLANT
CHLORAPREP W/TINT 26 (MISCELLANEOUS) ×2 IMPLANT
CLIP TI LARGE 6 (CLIP) ×2 IMPLANT
COVER BACK TABLE 60X90IN (DRAPES) ×2 IMPLANT
COVER MAYO STAND STRL (DRAPES) ×2 IMPLANT
DECANTER SPIKE VIAL GLASS SM (MISCELLANEOUS) IMPLANT
DERMABOND ADVANCED (GAUZE/BANDAGES/DRESSINGS) ×1
DERMABOND ADVANCED .7 DNX12 (GAUZE/BANDAGES/DRESSINGS) ×1 IMPLANT
DRAPE LAPAROSCOPIC ABDOMINAL (DRAPES) ×2 IMPLANT
DRAPE UTILITY XL STRL (DRAPES) ×2 IMPLANT
ELECT COATED BLADE 2.86 ST (ELECTRODE) ×2 IMPLANT
ELECT REM PT RETURN 9FT ADLT (ELECTROSURGICAL) ×2
ELECTRODE REM PT RTRN 9FT ADLT (ELECTROSURGICAL) ×1 IMPLANT
GAUZE SPONGE 4X4 12PLY STRL (GAUZE/BANDAGES/DRESSINGS) IMPLANT
GAUZE SPONGE 4X4 12PLY STRL LF (GAUZE/BANDAGES/DRESSINGS) ×2 IMPLANT
GLOVE SURG ENC MOIS LTX SZ6 (GLOVE) ×4 IMPLANT
GLOVE SURG UNDER POLY LF SZ6.5 (GLOVE) ×4 IMPLANT
GOWN STRL REUS W/ TWL LRG LVL3 (GOWN DISPOSABLE) ×1 IMPLANT
GOWN STRL REUS W/TWL 2XL LVL3 (GOWN DISPOSABLE) ×2 IMPLANT
GOWN STRL REUS W/TWL LRG LVL3 (GOWN DISPOSABLE) ×2
KIT MARKER MARGIN INK (KITS) ×2 IMPLANT
NEEDLE HYPO 25X1 1.5 SAFETY (NEEDLE) ×4 IMPLANT
NS IRRIG 1000ML POUR BTL (IV SOLUTION) ×2 IMPLANT
PACK BASIN DAY SURGERY FS (CUSTOM PROCEDURE TRAY) ×2 IMPLANT
PENCIL SMOKE EVACUATOR (MISCELLANEOUS) ×2 IMPLANT
SLEEVE SCD COMPRESS KNEE MED (STOCKING) ×2 IMPLANT
SPONGE T-LAP 18X18 ~~LOC~~+RFID (SPONGE) ×2 IMPLANT
STAPLER VISISTAT 35W (STAPLE) IMPLANT
STRIP CLOSURE SKIN 1/2X4 (GAUZE/BANDAGES/DRESSINGS) ×2 IMPLANT
SUT MON AB 4-0 PC3 18 (SUTURE) ×2 IMPLANT
SUT SILK 2 0 SH (SUTURE) IMPLANT
SUT VIC AB 3-0 54X BRD REEL (SUTURE) IMPLANT
SUT VIC AB 3-0 BRD 54 (SUTURE)
SUT VIC AB 3-0 SH 27 (SUTURE) ×2
SUT VIC AB 3-0 SH 27X BRD (SUTURE) ×1 IMPLANT
SYR BULB EAR ULCER 3OZ GRN STR (SYRINGE) ×2 IMPLANT
SYR CONTROL 10ML LL (SYRINGE) ×2 IMPLANT
TOWEL GREEN STERILE FF (TOWEL DISPOSABLE) ×2 IMPLANT
TUBE CONNECTING 20X1/4 (TUBING) ×2 IMPLANT
YANKAUER SUCT BULB TIP NO VENT (SUCTIONS) ×2 IMPLANT

## 2021-05-15 NOTE — Transfer of Care (Signed)
Immediate Anesthesia Transfer of Care Note  Patient: Rebecca [name]  Procedure(s) Performed: RE-EXCISION OF RIGHT BREAST LUMPECTOMY (Right: Breast)  Patient Location: PACU  Anesthesia Type:General  Level of Consciousness: drowsy  Airway & Oxygen Therapy: Patient Spontanous Breathing and Patient connected to face mask oxygen  Post-op Assessment: Report given to RN and Post -op Vital signs reviewed and stable  Post vital signs: Reviewed and stable  Last Vitals:  Vitals Value Taken Time  BP 121/56 05/15/21 0834  Temp    Pulse 69 05/15/21 0835  Resp 13 05/15/21 0835  SpO2 99 % 05/15/21 0835  Vitals shown include unvalidated device data.  Last Pain:  Vitals:   05/15/21 0628  TempSrc: Oral  PainSc: 0-No pain      Patients Stated Pain Goal: 3 (71/82/09 [id number])  Complications: No notable events documented.

## 2021-05-15 NOTE — Anesthesia Procedure Notes (Signed)
Procedure Name: LMA Insertion Date/Time: 05/15/2021 7:51 AM Performed by: Janene Harvey, CRNA Pre-anesthesia Checklist: Patient identified, Emergency Drugs available, Suction available and Patient being monitored Patient Re-evaluated:Patient Re-evaluated prior to induction Oxygen Delivery Method: Circle system utilized Preoxygenation: Pre-oxygenation with 100% oxygen Induction Type: IV induction LMA: LMA inserted LMA Size: 4.0 Placement Confirmation: positive ETCO2 Dental Injury: Teeth and Oropharynx as per pre-operative assessment

## 2021-05-15 NOTE — Discharge Instructions (Addendum)
[place] Office Phone Number [phone]  BREAST BIOPSY/ PARTIAL MASTECTOMY: POST OP INSTRUCTIONS  Always review your discharge instruction sheet given to you by the facility where your surgery was performed.  IF YOU HAVE DISABILITY OR FAMILY LEAVE FORMS, YOU MUST BRING THEM TO THE OFFICE FOR PROCESSING.  DO NOT GIVE THEM TO YOUR DOCTOR.  A prescription for pain medication may be given to you upon discharge.  Take your pain medication as prescribed, if needed.  If narcotic pain medicine is not needed, then you may take acetaminophen (Tylenol) or ibuprofen (Advil) as needed. Take your usually prescribed medications unless otherwise directed If you need a refill on your pain medication, please contact your pharmacy.  They will contact our office to request authorization.  Prescriptions will not be filled after 5pm or on week-ends. You should eat very light the first 24 hours after surgery, such as soup, crackers, pudding, etc.  Resume your normal diet the day after surgery. Most patients will experience some swelling and bruising in the breast.  Ice packs and a good support bra will help.  Swelling and bruising can take several days to resolve.  It is common to experience some constipation if taking pain medication after surgery.  Increasing fluid intake and taking a stool softener will usually help or prevent this problem from occurring.  A mild laxative (Milk of Magnesia or Miralax) should be taken according to package directions if there are no bowel movements after 48 hours. Unless discharge instructions indicate otherwise, you may remove your bandages 48 hours after surgery, and you may shower at that time.  You may have steri-strips (small skin tapes) in place directly over the incision.  These strips should be left on the skin for 7-10 days.   Any sutures or staples will be removed at the office during your follow-up visit. ACTIVITIES:  You may resume regular daily activities  (gradually increasing) beginning the next day.  Wearing a good support bra or sports bra (or the breast binder) minimizes pain and swelling.  You may have sexual intercourse when it is comfortable. You may drive when you no longer are taking prescription pain medication, you can comfortably wear a seatbelt, and you can safely maneuver your car and apply brakes. RETURN TO WORK:  __________1 week_______________ Dennis Bast should see your doctor in the office for a follow-up appointment approximately two weeks after your surgery.  Your doctor's nurse will typically make your follow-up appointment when she calls you with your pathology report.  Expect your pathology report 2-3 business days after your surgery.  You may call to check if you do not hear from [place] after three days.   WHEN TO CALL YOUR DOCTOR: Fever over 101.0 Nausea and/or vomiting. Extreme swelling or bruising. Continued bleeding from incision. Increased pain, redness, or drainage from the incision.  The clinic staff is available to answer your questions during regular business hours.  Please don't hesitate to call and ask to speak to one of the nurses for clinical concerns.  If you have a medical emergency, go to the nearest emergency room or call 911.  A surgeon from [facility] is always on call at the hospital.  For further questions, please visit [email or web address]    Post Anesthesia Home Care Instructions  Activity: Get plenty of rest for the remainder of the day. A responsible individual must stay with you for 24 hours following the procedure.  For the next 24 hours, DO NOT: -Drive a car -Operate  machinery -Drink alcoholic beverages -Take any medication unless instructed by your physician -Make any legal decisions or sign important papers.  Meals: Start with liquid foods such as gelatin or soup. Progress to regular foods as tolerated. Avoid greasy, spicy, heavy foods. If nausea and/or vomiting occur, drink  only clear liquids until the nausea and/or vomiting subsides. Call your physician if vomiting continues.  Special Instructions/Symptoms: Your throat may feel dry or sore from the anesthesia or the breathing tube placed in your throat during surgery. If this causes discomfort, gargle with warm salt water. The discomfort should disappear within 24 hours.  If you had a scopolamine patch placed behind your ear for the management of post- operative nausea and/or vomiting:  1. The medication in the patch is effective for 72 hours, after which it should be removed.  Wrap patch in a tissue and discard in the trash. Wash hands thoroughly with soap and water. 2. You may remove the patch earlier than 72 hours if you experience unpleasant side effects which may include dry mouth, dizziness or visual disturbances. 3. Avoid touching the patch. Wash your hands with soap and water after contact with the patch.

## 2021-05-15 NOTE — Anesthesia Postprocedure Evaluation (Signed)
Anesthesia Post Note  Patient: Rebecca [name]  Procedure(s) Performed: RE-EXCISION OF RIGHT BREAST LUMPECTOMY (Right: Breast)     Patient location during evaluation: PACU Anesthesia Type: General Level of consciousness: awake and alert Pain management: pain level controlled Vital Signs Assessment: post-procedure vital signs reviewed and stable Respiratory status: spontaneous breathing, nonlabored ventilation and respiratory function stable Cardiovascular status: blood pressure returned to baseline and stable Postop Assessment: no apparent nausea or vomiting Anesthetic complications: no   No notable events documented.  Last Vitals:  Vitals:   05/15/21 0900 05/15/21 0920  BP: (!) 115/51 121/60  Pulse: 63 66  Resp: (!) 9 16  Temp:  (!) 36.4 C  SpO2: 96% 98%    Last Pain:  Vitals:   05/15/21 0920  TempSrc:   PainSc: 0-No pain                 Lynda Rainwater

## 2021-05-15 NOTE — Anesthesia Preprocedure Evaluation (Signed)
Anesthesia Evaluation  Patient identified by MRN, date of birth, ID band Patient awake    Reviewed: Allergy & Precautions, NPO status , Patient's Chart, lab work & pertinent test results  History of Anesthesia Complications (+) PONV and history of anesthetic complications  Airway Mallampati: II  TM Distance: >3 FB Neck ROM: Full    Dental  (+) Teeth Intact, Dental Advisory Given   Pulmonary neg pulmonary ROS,    Pulmonary exam normal breath sounds clear to auscultation       Cardiovascular hypertension, Pt. on medications Normal cardiovascular exam Rhythm:Regular Rate:Normal     Neuro/Psych negative neurological ROS     GI/Hepatic Neg liver ROS, hiatal hernia, GERD  Medicated,  Endo/Other  Hypothyroidism   Renal/GU negative Renal ROS     Musculoskeletal  (+) Arthritis ,   Abdominal   Peds  Hematology negative hematology ROS (+)   Anesthesia Other Findings RIGHT BREAST CANCER  Reproductive/Obstetrics                             Anesthesia Physical  Anesthesia Plan  ASA: 3  Anesthesia Plan: General   Post-op Pain Management:    Induction: Intravenous  PONV Risk Score and Plan: 4 or greater and Dexamethasone, Ondansetron, Midazolam, Droperidol and Treatment may vary due to age or medical condition  Airway Management Planned: LMA  Additional Equipment:   Intra-op Plan:   Post-operative Plan: Extubation in OR  Informed Consent: I have reviewed the patients History and Physical, chart, labs and discussed the procedure including the risks, benefits and alternatives for the proposed anesthesia with the patient or authorized representative who has indicated his/her understanding and acceptance.     Dental advisory given  Plan Discussed with:   Anesthesia Plan Comments:         Anesthesia Quick Evaluation

## 2021-05-15 NOTE — Interval H&P Note (Signed)
History and Physical Interval Note:  05/15/2021 7:34 AM  Rebecca [name]  has presented today for surgery, with the diagnosis of RIGHT BREAST CANCER.  She is s/p right breast lumpectomy with SLN bx and has positive margins. The various methods of treatment have been discussed with the patient and family. After consideration of risks, benefits and other options for treatment, the patient has consented to  Procedure(s): RE-EXCISION OF RIGHT BREAST LUMPECTOMY (Right) as a surgical intervention.  The patient's history has been reviewed, patient examined, no change in status, stable for surgery.  I have reviewed the patient's chart and labs.  Questions were answered to the patient's satisfaction.     Stark Klein

## 2021-05-15 NOTE — Op Note (Signed)
Re-excisional Right Breast Lumpectomy   Indications: This patient presents with history of positive and close margins after partial mastectomy for right breast cancer.  Upper outer quadrant, cT2N0, grade 1 invasive ductal carcinoma, +/+/- and ALH.  Pre-operative Diagnosis: Right breast cancer   Post-operative Diagnosis: Right breast cancer   Surgeon: Stark Klein   Assistants: n/a   Anesthesia: General anesthesia and Local anesthesia 0.25% bupivacaine with epinephrine mixed half and half with lidocaine  ASA Class: 3   Procedure Details  The patient was seen in the Holding Room. The risks, benefits, complications, treatment options, and expected outcomes were discussed with the patient. The possibilities of reaction to medication, pulmonary aspiration, bleeding, infection, the need for additional procedures, failure to diagnose a condition, and creating a complication requiring transfusion or operation were discussed with the patient. The patient concurred with the proposed plan, giving informed consent. The site of surgery properly noted/marked. The patient was taken to Operating Room # 5, identified, and the procedure verified as re-excision of right breast cancer.  After induction of anesthesia, the right breast and chest were prepped and draped in standard fashion. The lumpectomy was performed by reopening the prior incision. Seroma was aspirated. The mastopexy sutures were removed. Additional margins were taken at the inferior border of the partial mastectomy cavity. The inferior aspect of the posterior margin had a thin layer of breast tissue so this was taken as the posterior margin. Dissection was carried down to the pectoral fascia. The margins were marked with paint.  The inferior margin is labeled with black paint and the posterior margin was labeled with blue pain.  Hemostasis was achieved with cautery. Orange clips were replaced on the appropriate margins.  The right axillary seroma  was aspirated.  The wound was irrigated and closed with a 3-0 Vicryl deep dermal interrupted and a 4-0 Monocryl subcuticular closure in layers.  Sterile dressings were applied. At the end of the operation, all sponge, instrument, and needle counts were correct.   Findings:  grossly clear surgical margins, posterior margin is pectoralis.  Anterior margin is skin.   Estimated Blood Loss: Minimal   Drains: none   Specimens: additional inferior margin with black pain, additional posterior margin with blue paint.    Complications: None; patient tolerated the procedure well.   Disposition: PACU - hemodynamically stable.   Condition: stable

## 2021-05-16 ENCOUNTER — Encounter (HOSPITAL_BASED_OUTPATIENT_CLINIC_OR_DEPARTMENT_OTHER): Payer: Self-pay | Admitting: General Surgery

## 2021-05-16 LAB — SURGICAL PATHOLOGY

## 2021-05-16 NOTE — Progress Notes (Signed)
Left message stating courtesy call and if any questions or concerns please call the doctors office.  

## 2021-05-22 ENCOUNTER — Encounter: Payer: Self-pay | Admitting: *Deleted

## 2021-06-07 ENCOUNTER — Encounter: Payer: Self-pay | Admitting: Oncology

## 2021-06-07 ENCOUNTER — Inpatient Hospital Stay: Payer: Medicare PPO | Attending: Hematology and Oncology | Admitting: Oncology

## 2021-06-07 DIAGNOSIS — C50411 Malignant neoplasm of upper-outer quadrant of right female breast: Secondary | ICD-10-CM

## 2021-06-07 DIAGNOSIS — Z170 Estrogen receptor positive status [ER+]: Secondary | ICD-10-CM

## 2021-06-07 NOTE — Progress Notes (Signed)
Rebecca [name]  [phone] [phone]     ID: Rebecca [name] DOB: [birth year]  MR#: [id number]  VEL#:[id number]  Patient Care Team: Ginger Organ., MD as PCP - General (Internal Medicine) Mauro Kaufmann, RN as Oncology Nurse Navigator Rockwell Germany, RN as Oncology Nurse Navigator Stark Klein, MD as Consulting Physician (General Surgery) Krystie Leiter, Virgie Dad, MD as Consulting Physician (Oncology) Gery Pray, MD as Consulting Physician (Radiation Oncology) Chauncey Cruel, MD OTHER MD:  CHIEF COMPLAINT: Estrogen receptor positive breast cancer  CURRENT TREATMENT:    INTERVAL HISTORY: Rebecca [name] was scheduled today for follow up of her estrogen receptor positive breast cancer.  However she did not show  She underwent right lumpectomy on 04/28/2021 under Dr. Barry Dienes. Pathology from the procedure ([id number]) showed: invasive ductal carcinoma, grade 2, spanning 2.4 cm; intermediate grade ductal carcinoma in situ; invasive carcinoma present at inferior margin broadly; atypical lobular hyperplasia.  All 5 biopsied lymph nodes were negative for carcinoma (0/5).  She proceeded to re-excision of the positive margin on 05/15/2021. Pathology [phone]) showed only focal atypical lobular hyperplasia, negative for carcinoma.  She met with Dr. Sondra Come on 05/01/2021. Per his note, she will return for radiation therapy following her re-excision. This has not yet been scheduled.   REVIEW OF SYSTEMS: [place]    COVID 19 VACCINATION STATUS: Status post Pfizer x4 as of September 2022   HISTORY OF CURRENT ILLNESS: From the original intake note:  Rebecca [name] has a history of stage T1c left breast cancer, status post lumpectomy 06/2006, radiation therapy, and 5 years of Femara under the care of Dr. Truddie Coco.  More recently, she had routine screening mammography on 03/09/2021 showing a possible abnormality in the right breast. She underwent right  diagnostic mammography with tomography and right breast ultrasonography at [facility] on 03/20/2021 showing: breast density category C; 2.2 cm irregular right breast mass at 10 o'clock; no right axillary adenopathy.  Accordingly on 03/23/2021 she proceeded to biopsy of the right breast area in question. The pathology from this procedure ([id number]) showed: invasive and in situ mammary carcinoma, e-cadherin positive, grade 1/2. Prognostic indicators significant for: estrogen receptor, 100% positive and progesterone receptor, 95% positive, both with strong staining intensity. Proliferation marker Ki67 at 1%. HER2 equivocal by immunohistochemistry (2+), but negative by fluorescent in situ hybridization with a signals ratio 1.27 and number per cell 1.65.  She underwent breast MRI on 03/28/2021 showing: breast composition C; 1.5 cm biopsy-proven malignancy in upper-outer right breast; indeterminate 1.2 cm focal non-mass enhancement in lower-outer right breast; no evidence of malignancy in left breast; no suspicious lymphadenopathy.  She proceeded to additional right breast biopsies on 04/13/2021. Pathology [phone]) showed: 1. Right Breast, lower outer  - increased stromal fibrosis with fibrocystic changes, duct ectasia and calcifications 2. Right Breast, lateral mid depth  - stromal fibrosis with lobular neoplasia and adenosis  She is scheduled for definitive surgery on 04/28/2021.  Cancer Staging Malignant neoplasm of upper-outer quadrant of right breast in female, estrogen receptor positive (Post) Staging form: Breast, AJCC 8th Edition - Clinical: Stage IA (cT1c, cN0, cM0, G1, ER+, PR+, HER2-) - Signed by Chauncey Cruel, MD on 04/27/2021 Histologic grading system: 3 grade system  The patient's subsequent history is as detailed below.   PAST MEDICAL HISTORY: Past Medical History:  Diagnosis Date   Anemia    Breast CA ([place])    Left   GERD (gastroesophageal reflux disease)    History  of  cataract    History of hiatal hernia    Hypertension 2022   Hypothyroidism    OA (osteoarthritis)    Personal history of radiation therapy    PONV (postoperative nausea and vomiting)    Vitamin D deficiency     PAST SURGICAL HISTORY: Past Surgical History:  Procedure Laterality Date   BREAST EXCISIONAL BIOPSY Left    BREAST LUMPECTOMY Left    CATARACT EXTRACTION, BILATERAL     COLONOSCOPY  01/03/2007   TOTAL HIP ARTHROPLASTY Left 03/06/2019   Procedure: LEFT TOTAL HIP ARTHROPLASTY ANTERIOR APPROACH;  Surgeon: Mcarthur Rossetti, MD;  Location: [facility];  Service: Orthopedics;  Laterality: Left;   UPPER GI ENDOSCOPY  09/23/2007    FAMILY HISTORY: Family History  Problem Relation Age of Onset   Breast cancer Sister   The patient's father had a history of renal cancer, subsequently dying in his 49s from other causes.  The patient's mother also died at an advanced age.  1 of 2 maternal aunts had bladder cancer and 1 maternal cousin had breast cancer.  The patient has 1 brother, 4 sisters.  1 sister died from renal cancer, 1 sister Rebecca [name]) has a history of breast cancer   GYNECOLOGIC HISTORY:  No LMP recorded. Patient is postmenopausal. Menarche: 80 years old [name] 0 LMP 48 HRT more than 10 yearsHysterectomy? no BSO? 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  Her husband Rebecca [name] worked for [employer].  Her (adopted) children are Rebecca [name], 79, who lives in [place] is a Pharmacist, hospital in middle school and has 5 children; and Rebecca [name], who has ADHD and bipolar issues is age 70 and lives with the patient.  Rebecca [name] attends [employer]    ADVANCED DIRECTIVES: In the absence of any documentation to the contrary, the patient's spouse is their HCPOA.   HEALTH MAINTENANCE: Social History   Tobacco Use   Smoking status: Never   Smokeless tobacco: Never  Vaping Use   Vaping Use: Never used   Substance Use Topics   Alcohol use: Yes    Comment: maybe once a month   Drug use: Never     No Known Allergies  Current Outpatient Medications  Medication Sig Dispense Refill   B Complex-C (SUPER B COMPLEX PO) Take 1 tablet by mouth daily.     calcium carbonate (TUMS - DOSED IN MG ELEMENTAL CALCIUM) 500 MG chewable tablet Chew 2 tablets by mouth 3 (three) times daily as needed for indigestion or heartburn.     cholecalciferol (VITAMIN D3) 25 MCG (1000 UT) tablet Take 1,000 Units by mouth daily.     levothyroxine (SYNTHROID) 75 MCG tablet Take 75 mcg by mouth daily before breakfast.      magnesium oxide (MAG-OX) 400 MG tablet Take 400 mg by mouth daily as needed (cramps).     olmesartan (BENICAR) 20 MG tablet Take 20 mg by mouth daily.     POLY-IRON 150 150 MG capsule Take 150 mg by mouth daily.      omeprazole (PRILOSEC) 20 MG capsule Take 20 mg by mouth daily as needed (acid reflux). (Patient not taking: Reported on 04/26/2021)     No current facility-administered medications for this visit.    OBJECTIVE: White woman in no acute distress  Vitals:   04/26/21 1648  BP: (!) 153/73  Pulse: 73  Resp: 18  Temp: 97.9 F (36.6 C)  SpO2:  100%     Body mass index is 20.3 kg/m.   Wt Readings from Last 3 Encounters:  04/26/21 129 lb 9.6 oz (58.8 kg)  04/19/21 132 lb 3.2 oz (60 kg)  04/27/19 134 lb (60.8 kg)      ECOG FS:1 - Symptomatic but completely ambulatory  Ocular: Sclerae unicteric, pupils round and equal Ear-nose-throat: Wearing a mask Lymphatic: No cervical or supraclavicular adenopathy Lungs no rales or rhonchi Heart regular rate and rhythm Abd soft, nontender, positive bowel sounds MSK no focal spinal tenderness, no joint edema Neuro: non-focal, well-oriented, appropriate affect Breasts:    LAB RESULTS:  CMP     Component Value Date/Time   NA 139 04/19/2021 1000   K 4.0 04/19/2021 1000   CL 107 04/19/2021 1000   CO2 26 04/19/2021 1000   GLUCOSE 98  04/19/2021 1000   BUN 30 (H) 04/19/2021 1000   CREATININE 0.78 04/19/2021 1000   CALCIUM 9.2 04/19/2021 1000   PROT 6.5 07/10/2011 1526   ALBUMIN 4.2 07/10/2011 1526   AST 35 07/10/2011 1526   ALT 30 07/10/2011 1526   ALKPHOS 79 07/10/2011 1526   BILITOT 0.3 07/10/2011 1526   GFRNONAA >60 04/19/2021 1000   GFRAA >60 03/07/2019 0318    No results found for: TOTALPROTELP, ALBUMINELP, A1GS, A2GS, BETS, BETA2SER, GAMS, MSPIKE, SPEI  Lab Results  Component Value Date   WBC 5.4 04/19/2021   NEUTROABS 4.3 07/10/2011   HGB 12.6 04/19/2021   HCT 38.8 04/19/2021   MCV 93.9 04/19/2021   PLT 284 04/19/2021    Lab Results  Component Value Date   LABCA2 16 12/05/2010    No components found for: HQIONG295  No results for input(s): INR in the last 168 hours.  Lab Results  Component Value Date   LABCA2 16 12/05/2010    No results found for: MWU132  No results found for: GMW102  No results found for: VOZ366  No results found for: CA2729  No components found for: HGQUANT  No results found for: CEA1 / No results found for: CEA1   No results found for: AFPTUMOR  No results found for: CHROMOGRNA  No results found for: KPAFRELGTCHN, LAMBDASER, KAPLAMBRATIO (kappa/lambda light chains)  No results found for: HGBA, HGBA2QUANT, HGBFQUANT, HGBSQUAN (Hemoglobinopathy evaluation)   Lab Results  Component Value Date   LDH 175 06/05/2010    No results found for: IRON, TIBC, IRONPCTSAT (Iron and TIBC)  No results found for: FERRITIN  Urinalysis No results found for: COLORURINE, APPEARANCEUR, LABSPEC, PHURINE, GLUCOSEU, HGBUR, BILIRUBINUR, KETONESUR, PROTEINUR, UROBILINOGEN, NITRITE, LEUKOCYTESUR   STUDIES: MM RT RADIOACTIVE SEED LOC MAMMO GUIDE  Result Date: 04/27/2021 CLINICAL DATA:  Localization of the ribbon shaped clip and the dumbbell shaped clip prior to surgery. EXAM: MAMMOGRAPHIC GUIDED RADIOACTIVE SEED LOCALIZATION OF THE RIGHT BREAST COMPARISON:  Previous  exam(s). FINDINGS: Patient presents for radioactive seed localization prior to surgery. I met with the patient and we discussed the procedure of seed localization including benefits and alternatives. We discussed the high likelihood of a successful procedure. We discussed the risks of the procedure including infection, bleeding, tissue injury and further surgery. We discussed the low dose of radioactivity involved in the procedure. Informed, written consent was given. The usual time-out protocol was performed immediately prior to the procedure. Using mammographic guidance, sterile technique, 1% lidocaine and an I-125 radioactive seed, the ribbon shaped clip was localized using a lateral approach. The follow-up mammogram images confirm the seed in the expected location and were marked  for the surgeon. Follow-up survey of the patient confirms presence of the radioactive seed. Order number of I-125 seed:  [id number]. Total activity:  4.010 millicurie reference Date: April 04, 2021 Using mammographic guidance, sterile technique, 1% lidocaine and an I-125 radioactive seed, the dumbbell shaped clip was localized using a lateral approach. The follow-up mammogram images confirm the seed in the expected location and were marked for the surgeon. Follow-up survey of the patient confirms presence of the radioactive seed. Order number of I-125 seed:  [id number]. Total activity:  0.347 millicuries reference Date: April 04, 2021 The patient tolerated the procedure well and was released from the [facility]. She was given instructions regarding seed removal. IMPRESSION: Radioactive seed localization right breast. No apparent complications. Electronically Signed   By: Dorise Bullion III M.D.   On: 04/27/2021 18:12   MM RT RADIO SEED EA ADD LESION LOC MAMMO  Result Date: 04/27/2021 CLINICAL DATA:  Localization of the ribbon shaped clip and the dumbbell shaped clip prior to surgery. EXAM: MAMMOGRAPHIC GUIDED RADIOACTIVE SEED  LOCALIZATION OF THE RIGHT BREAST COMPARISON:  Previous exam(s). FINDINGS: Patient presents for radioactive seed localization prior to surgery. I met with the patient and we discussed the procedure of seed localization including benefits and alternatives. We discussed the high likelihood of a successful procedure. We discussed the risks of the procedure including infection, bleeding, tissue injury and further surgery. We discussed the low dose of radioactivity involved in the procedure. Informed, written consent was given. The usual time-out protocol was performed immediately prior to the procedure. Using mammographic guidance, sterile technique, 1% lidocaine and an I-125 radioactive seed, the ribbon shaped clip was localized using a lateral approach. The follow-up mammogram images confirm the seed in the expected location and were marked for the surgeon. Follow-up survey of the patient confirms presence of the radioactive seed. Order number of I-125 seed:  [id number]. Total activity:  5.643 millicurie reference Date: April 04, 2021 Using mammographic guidance, sterile technique, 1% lidocaine and an I-125 radioactive seed, the dumbbell shaped clip was localized using a lateral approach. The follow-up mammogram images confirm the seed in the expected location and were marked for the surgeon. Follow-up survey of the patient confirms presence of the radioactive seed. Order number of I-125 seed:  [id number]. Total activity:  6.606 millicuries reference Date: April 04, 2021 The patient tolerated the procedure well and was released from the [facility]. She was given instructions regarding seed removal. IMPRESSION: Radioactive seed localization right breast. No apparent complications. Electronically Signed   By: Dorise Bullion III M.D.   On: 04/27/2021 18:12   MM CLIP PLACEMENT RIGHT  Addendum Date: 04/21/2021   ADDENDUM REPORT: 04/21/2021 15:56 ADDENDUM: Request is made for review of pathology and imaging findings  prior to seed localization of the RIGHT breast. Ultrasound-guided core biopsy of mass in the UPPER OUTER QUADRANT of the RIGHT breast was marked with a ribbon shaped clip. Biopsy showed invasive and in situ mammary carcinoma. MR guided core biopsy of mass in the LATERAL middle depth of the RIGHT breast is marked with a barbell shaped clip and showed concordant lobular neoplasia (atypical lobular hyperplasia). Excision is recommended. MR guided core biopsy of non mass enhancement in the OUTER RIGHT breast is marked with a cylinder-shaped clip and showed benign concordant stromal fibrosis with fibrocystic changes and duct ectasia. Given these findings, recommend placement of 2 radioactive seeds, one at the at the ribbon clip and another at the barbell shaped clip. Electronically Signed  By: Nolon Nations M.D.   On: 04/21/2021 15:56   Result Date: 04/21/2021 CLINICAL DATA:  Post biopsy mammogram of the right breast for clip placement. EXAM: 3D DIAGNOSTIC RIGHT MAMMOGRAM POST MRI BIOPSY COMPARISON:  Previous exam(s). FINDINGS: 3D Mammographic images were obtained following MRI guided biopsy of non mass enhancement in the lower outer right breast, and an enhancing mass in the lateral mid depth of the right breast. The biopsy marking clips are in expected position at the site sites of biopsy. IMPRESSION: 1. Appropriate positioning of the cylinder shaped biopsy marking clip at the site of biopsy in the lower outer right breast. 2. Appropriate positioning of the barbell shaped biopsy marking clip in the lateral mid to anterior depth of the right breast. Final Assessment: Post Procedure Mammograms for Marker Placement Electronically Signed: By: Ammie Ferrier M.D. On: 04/13/2021 11:33   MR RT BREAST BX W LOC DEV 1ST LESION IMAGE BX SPEC MR GUIDE  Addendum Date: 04/14/2021   ADDENDUM REPORT: 04/14/2021 12:57 ADDENDUM: Pathology revealed INCREASED STROMAL FIBROSIS WITH FIBROCYSTIC CHANGES, DUCT ECTASIA AND  CALCIFICATIONS of the Right breast, lower outer, (cylinder clip). This was found to be concordant by Dr. Ammie Ferrier. Pathology revealed STROMAL FIBROSIS WITH LOBULAR NEOPLASIA (ATYPICAL LOBULAR HYPERPLASIA) AND ADENOSIS of the Right breast, lateral mid depth, (barbell clip). This was found to be concordant by Dr. Ammie Ferrier, with excision recommended. Pathology results were discussed with the patient by telephone. The patient reported doing well after the biopsies with tenderness and bruising at the sites. Post biopsy instructions and care were reviewed and questions were answered. The patient was encouraged to call [facility] for any additional concerns. My direct phone number was provided. The patient has a recent diagnosis of Right breast cancer and should follow her outlined treatment plan. Pathology results were sent to Dr. Barry Dienes and Bary Castilla, RN Oncology Navigator via [facility] message on April 14, 2021. Pathology results reported by Terie Purser, RN on 04/14/2021. Electronically Signed   By: Ammie Ferrier M.D.   On: 04/14/2021 12:57   Result Date: 04/14/2021 CLINICAL DATA:  80 year old female presenting for MRI guided biopsy of non mass enhancement in the lower outer right breast. During the procedure, a 6 mm enhancing mass was seen anterior to the biopsy proven cancer in the right breast, and therefore this lesion was biopsied as well. EXAM: MRI GUIDED CORE NEEDLE BIOPSY OF THE RIGHT BREAST TECHNIQUE: Multiplanar, multisequence MR imaging of the right breast was performed both before and after administration of intravenous contrast. CONTRAST:  67m GADAVIST GADOBUTROL 1 MMOL/ML IV SOLN COMPARISON:  Previous exams. FINDINGS: I met with the patient, and we discussed the procedure of MRI guided biopsy, including risks, benefits, and alternatives. Specifically, we discussed the risks of infection, bleeding, tissue injury, clip migration, and inadequate sampling.  Informed, written consent was given. The usual time out protocol was performed immediately prior to the procedure. Using sterile technique, 1% Lidocaine, MRI guidance, and a 9 gauge vacuum assisted device, biopsy was performed of non mass enhancement in the lower outer right breast using a lateral approach. At the conclusion of the procedure, a cylinder shaped tissue marker clip was deployed into the biopsy cavity. Using sterile technique, 1% Lidocaine, MRI guidance, and a 9 gauge vacuum assisted device, biopsy was performed of a 6 mm enhancing mass in the lateral middle depth of the right breast using a lateral approach. At the conclusion of the procedure, a barbell shaped tissue  marker clip was deployed into the biopsy cavity. Follow-up 2-view mammogram was performed and dictated separately. IMPRESSION: 1. MRI guided biopsy of non mass enhancement in the lower outer right breast. No apparent complications. 2. MRI guided biopsy of a 6 mm enhancing mass in the lateral middle depth of the right breast. No apparent complications. Electronically Signed: By: Ammie Ferrier M.D. On: 04/13/2021 11:31  MR RT BREAST BX W LOC DEV EA ADD LESION IMAGE BX SPEC MR GUIDE  Addendum Date: 04/14/2021   ADDENDUM REPORT: 04/14/2021 12:57 ADDENDUM: Pathology revealed INCREASED STROMAL FIBROSIS WITH FIBROCYSTIC CHANGES, DUCT ECTASIA AND CALCIFICATIONS of the Right breast, lower outer, (cylinder clip). This was found to be concordant by Dr. Ammie Ferrier. Pathology revealed STROMAL FIBROSIS WITH LOBULAR NEOPLASIA (ATYPICAL LOBULAR HYPERPLASIA) AND ADENOSIS of the Right breast, lateral mid depth, (barbell clip). This was found to be concordant by Dr. Ammie Ferrier, with excision recommended. Pathology results were discussed with the patient by telephone. The patient reported doing well after the biopsies with tenderness and bruising at the sites. Post biopsy instructions and care were reviewed and questions were answered. The  patient was encouraged to call [facility] for any additional concerns. My direct phone number was provided. The patient has a recent diagnosis of Right breast cancer and should follow her outlined treatment plan. Pathology results were sent to Dr. Barry Dienes and Bary Castilla, RN Oncology Navigator via [facility] message on April 14, 2021. Pathology results reported by Terie Purser, RN on 04/14/2021. Electronically Signed   By: Ammie Ferrier M.D.   On: 04/14/2021 12:57   Result Date: 04/14/2021 CLINICAL DATA:  80 year old female presenting for MRI guided biopsy of non mass enhancement in the lower outer right breast. During the procedure, a 6 mm enhancing mass was seen anterior to the biopsy proven cancer in the right breast, and therefore this lesion was biopsied as well. EXAM: MRI GUIDED CORE NEEDLE BIOPSY OF THE RIGHT BREAST TECHNIQUE: Multiplanar, multisequence MR imaging of the right breast was performed both before and after administration of intravenous contrast. CONTRAST:  59m GADAVIST GADOBUTROL 1 MMOL/ML IV SOLN COMPARISON:  Previous exams. FINDINGS: I met with the patient, and we discussed the procedure of MRI guided biopsy, including risks, benefits, and alternatives. Specifically, we discussed the risks of infection, bleeding, tissue injury, clip migration, and inadequate sampling. Informed, written consent was given. The usual time out protocol was performed immediately prior to the procedure. Using sterile technique, 1% Lidocaine, MRI guidance, and a 9 gauge vacuum assisted device, biopsy was performed of non mass enhancement in the lower outer right breast using a lateral approach. At the conclusion of the procedure, a cylinder shaped tissue marker clip was deployed into the biopsy cavity. Using sterile technique, 1% Lidocaine, MRI guidance, and a 9 gauge vacuum assisted device, biopsy was performed of a 6 mm enhancing mass in the lateral middle depth of the right breast using  a lateral approach. At the conclusion of the procedure, a barbell shaped tissue marker clip was deployed into the biopsy cavity. Follow-up 2-view mammogram was performed and dictated separately. IMPRESSION: 1. MRI guided biopsy of non mass enhancement in the lower outer right breast. No apparent complications. 2. MRI guided biopsy of a 6 mm enhancing mass in the lateral middle depth of the right breast. No apparent complications. Electronically Signed: By: MAmmie FerrierM.D. On: 04/13/2021 11:31    ELIGIBLE FOR AVAILABLE RESEARCH PROTOCOL: no  ASSESSMENT: 80y.o. [place] woman status  post left breast upper outer quadrant biopsy 06/12/2006 for invasive ductal carcinoma, grade 1,  (1) status post left lumpectomy with no sentinel lymph node sampling 07/08/2016 for a pT1c pNX, stage IA invasive ductal carcinoma, grade 1, estrogen and progesterone receptor positive, with an MIB-1 of 3%.  (2) status post adjuvant radiation to the left breast  (3) status post letrozole x5 years  (4) status post biopsy of the right breast upper outer quadrant 03/23/2021 for a clinical T2 N0, stage IA invasive ductal carcinoma, grade 1 or 2, estrogen receptor and progesterone receptor strongly positive, MIB-1 1%, with no amplification of HER2.  (5) genetics testing  (6) definitive surgery pending  (7) consider adjuvant radiation  (8) antiestrogen   PLAN: The patient did not show for her appointment 06/07/2021.  A follow-up letter has been sent  Sarajane Jews C. Reiss Mowrey, MD 04/27/2021 8:17 PM Medical Oncology and Hematology [facility] [place], [place] [address] Tel. [phone]    Fax. [phone]   This document serves as a record of services personally performed by Lurline Del, MD. It was created on his behalf by Wilburn Mylar, a trained medical scribe. The creation of this record is based on the scribe's personal observations and the provider's statements to them.    I, Lurline Del MD, have reviewed the above documentation for accuracy and completeness, and I agree with the above.   *Total Encounter Time as defined by the Centers for Medicare and Medicaid Services includes, in addition to the face-to-face time of a patient visit (documented in the note above) non-face-to-face time: obtaining and reviewing outside history, ordering and reviewing medications, tests or procedures, care coordination (communications with other health care professionals or caregivers) and documentation in the medical record.

## 2021-06-08 ENCOUNTER — Encounter: Payer: Self-pay | Admitting: *Deleted

## 2021-06-13 ENCOUNTER — Encounter: Payer: Self-pay | Admitting: *Deleted

## 2021-06-13 ENCOUNTER — Telehealth: Payer: Self-pay | Admitting: Radiation Oncology

## 2021-06-13 DIAGNOSIS — C50411 Malignant neoplasm of upper-outer quadrant of right female breast: Secondary | ICD-10-CM

## 2021-06-14 NOTE — Progress Notes (Signed)
Radiation Oncology         [phone] ________________________________  Name: Rebecca [name] MRN: [id number]  Date: 06/15/2021  DOB: 09-Jul-1941  Re-Evaluation Note  CC: Ginger Organ., MD  Magrinat, Virgie Dad, MD    ICD-10-CM   1. Malignant neoplasm of upper-outer quadrant of right breast in female, estrogen receptor positive ([place])  C50.411    Z17.0       Diagnosis:   Stage IA (cT1c, cN0, cM0) Right Breast UOQ, Invasive Ductal Carcinoma and Intermediate grade DCIS,  ER+ / PR+ / Her2-, Grade 2 (pT2, pN0)  Narrative:  The patient returns today to discuss radiation treatment options. She was seen for consultation on 05/01/21.   To review from her her last visit, final pathology was pending from initial right breast lumpectomy. Pathology from right breast lumpectomy on 04/28/21 revealed: grade 2 invasive ductal carcinoma spanning 2.4 cm with atypical lobular hyperplasia, and intermediate grade DCIS. Margin status of invasive disease: carcinoma present at the inferior margin broadly, <1 mm from the posterior margin broadly, and 2 mm from the lateral margin focally. Margin status of in-situ disease: DCIS <1 mm from the inferior margin and 1 mm from the posterior margin focally. Nodal status of 5/5 right axillary sentinel lymph node excisions negative for carcinoma. Prognostic indicators significant for: ER 100% positive, PR 95% positive, both with strong staining intensity; Her2 status negative; Ki67 1%; Grade 2.  She proceeded to undergo re-excision of the positive margin on 05/15/2021. Pathology from the procedure revealed no evidence of carcinoma from re-excisions of the right inferior and posterior margins. Focal atypical lobular hyperplasia was identified from the right posterior margin, and fibrocystic changes from both margins.    The patient recently followed up with Dr.Byerly on 06/12/21. Physical exam performed revealed the right axilla without appreciable seroma.   Of note: the  patient was scheduled to follow-up with Dr. Jana Hakim on 06/07/21 but was a no-show.  On review of systems, the patient reports doing well.  She denies any pain within the right breast nipple discharge or bleeding.  She denies any problems with swelling in her right arm or hand.  She does report chronic tenderness in the left breast which was actually present prior to her surgery and radiation therapy several years ago.Marland Kitchen    PREVIOUS RADIATION THERAPY: Yes, history of stage IA (pT1c, pNX) left breast cancer (ER& PR+, G1), status post lumpectomy 07/08/2016, adjuvant radiation therapy, and 5 years of Femara under the care of Dr. Truddie Coco.  The patient received 50.4 Pearline Cables directed to the left breast.  The site of presentation in the upper inner aspect of the left breast was boosted further to a cumulative dose of 63 Gray.  Radiation treatments extending from September 12, 2006 through October 31, 2006.  She reports tolerating the radiation well without any long-term effects.   Allergies:  has No Known Allergies.  Meds: Current Outpatient Medications  Medication Sig Dispense Refill   B Complex-C (SUPER B COMPLEX PO) Take 1 tablet by mouth daily.     cholecalciferol (VITAMIN D3) 25 MCG (1000 UT) tablet Take 1,000 Units by mouth daily.     levothyroxine (SYNTHROID) 75 MCG tablet Take 75 mcg by mouth daily before breakfast.      magnesium oxide (MAG-OX) 400 MG tablet Take 400 mg by mouth daily as needed (cramps).     olmesartan (BENICAR) 20 MG tablet Take 20 mg by mouth daily.     omeprazole (PRILOSEC) 20 MG capsule  Take 20 mg by mouth daily as needed (acid reflux).     POLY-IRON 150 150 MG capsule Take 150 mg by mouth daily.      calcium carbonate (TUMS - DOSED IN MG ELEMENTAL CALCIUM) 500 MG chewable tablet Chew 2 tablets by mouth 3 (three) times daily as needed for indigestion or heartburn. (Patient not taking: Reported on 06/15/2021)     Docusate Calcium (STOOL SOFTENER PO) Take 3 tablets by mouth as needed.  (Patient not taking: Reported on 06/15/2021)     glycerin adult 2 g suppository Place 1 suppository rectally as needed for constipation. (Patient not taking: Reported on 06/15/2021)     oxyCODONE (OXY IR/ROXICODONE) 5 MG immediate release tablet Take 0.5-1 tablets (2.5-5 mg total) by mouth every 6 (six) hours as needed for severe pain. (Patient not taking: No sig reported) 5 tablet 0   polyethylene glycol (MIRALAX / GLYCOLAX) 17 g packet Take 17 g by mouth as needed. (Patient not taking: Reported on 06/15/2021)     No current facility-administered medications for this encounter.    Physical Findings: The patient is in no acute distress. Patient is alert and oriented.  height is 5' 6" (1.676 m) and weight is 135 lb 11.2 oz (61.6 kg). Her temperature is 97.6 F (36.4 C). Her blood pressure is 120/54 (abnormal) and her pulse is 69. Her respiration is 20 and oxygen saturation is 100%.  No significant changes. Lungs are clear to auscultation bilaterally. Heart has regular rate and rhythm. No palpable cervical, supraclavicular, or axillary adenopathy. Abdomen soft, non-tender, normal bowel sounds. Left breast: no palpable mass, nipple discharge or bleeding.  Tenderness with palpation in the inferior aspect of the breast.  Tattoos in place from her prior radiation therapy. Right breast: Well-healing scar in the periareolar area.  No signs of infection within the breast.  No nipple discharge or bleeding.  She also has a separate scar in the axillary region which is also doing well without signs of drainage or infection.  She has good arm and shoulder mobility along the right side.  Lab Findings: Lab Results  Component Value Date   WBC 5.4 04/19/2021   HGB 12.6 04/19/2021   HCT 38.8 04/19/2021   MCV 93.9 04/19/2021   PLT 284 04/19/2021    Radiographic Findings: No results found.  Impression:  Stage IA (cT1c, cN0, cM0) Right Breast UOQ, Invasive Ductal Carcinoma and Intermediate grade DCIS,  ER+ / PR+  / Her2-, Grade 2 (pT2, pN0)  The patient would be a good candidate for adjuvant radiation therapy.  In light of the size of the tumor I would be more in favor of offering radiation therapy in this situation rather than omitting radiation therapy.  She is agreeable.  In late November she will meet with Dr. Jana Hakim to discuss adjuvant hormonal therapy.  Plan:  She is scheduled for CT simulation November 10.  Treatments to begin approximately a week later.  She will receive 16 treatments to the right breast.  No plans for a boost since her reexcision was completely clear without any residual tumor.  -----------------------------------  Blair Promise, PhD, MD  This document serves as a record of services personally performed by Gery Pray, MD. It was created on his behalf by Roney Mans, a trained medical scribe. The creation of this record is based on the scribe's personal observations and the provider's statements to them. This document has been checked and approved by the attending provider.

## 2021-06-14 NOTE — Progress Notes (Signed)
Location of Breast Cancer:upper-outer quadrant of right breast   Histology per Pathology Report:  03/23/2021 Diagnosis Breast, right, needle core biopsy, upper outer, 10 o'clock - INVASIVE MAMMARY CARCINOMA, GRADE 1/2. - MAMMARY CARCINOMA IN SITU.   Receptor Status:  The tumor cells are EQUIVOCAL for Her2 (2+). Her2 by FISH will be performed and results reported separately. Estrogen Receptor: 100%, POSITIVE, STRONG STAINING INTENSITY Progesterone Receptor: 95%, POSITIVE, STRONG STAINING INTENSITY Proliferation Marker Ki67: 1%   Did patient present with symptoms (if so, please note symptoms) or was this found on screening mammography?: presented with a palpable mass   Past/Anticipated interventions by surgeon, if any: Dr Barry Dienes Right Breast Radioactive seed bracketed lumpectomy and sentinel lymph node biopsy   Past/Anticipated interventions by medical oncology, if any:  previous breast cancer in 2004 on the left that was treated with breast conservation, radiation, and tamoxifen. Dr. Annamaria Boots was her surgeon. She did not receive chemotherapy at the time but it was offered.   does not meet criteria for anti-HER-2 immunotherapy, will receive antiestrogens as her systemic treatment    Lymphedema issues, if any:  denies     Pain issues, if any:  denies    SAFETY ISSUES: Prior radiation? yes, left breast 2004 Pacemaker/ICD? no Possible current pregnancy?no, postmenopausal Is the patient on methotrexate? no   Current Complaints / other details:  none  Vitals:   06/15/21 1430  BP: (!) 120/54  Pulse: 69  Resp: 20  Temp: 97.6 F (36.4 C)  SpO2: 100%  Weight: 135 lb 11.2 oz (61.6 kg)  Height: _0  (1.676 m)

## 2021-06-15 ENCOUNTER — Encounter: Payer: Self-pay | Admitting: Radiation Oncology

## 2021-06-15 ENCOUNTER — Ambulatory Visit
Admission: RE | Admit: 2021-06-15 | Discharge: 2021-06-15 | Disposition: A | Discharge status: Home / Self Care | Payer: Medicare PPO | Source: Ambulatory Visit | Attending: Radiation Oncology | Admitting: Radiation Oncology

## 2021-06-15 ENCOUNTER — Other Ambulatory Visit: Payer: Self-pay

## 2021-06-15 VITALS — BP 120/54 | HR 69 | Temp 97.6°F | Resp 20 | Ht 66.0 in | Wt 135.7 lb

## 2021-06-15 DIAGNOSIS — Z170 Estrogen receptor positive status [ER+]: Secondary | ICD-10-CM | POA: Diagnosis not present

## 2021-06-15 DIAGNOSIS — C50411 Malignant neoplasm of upper-outer quadrant of right female breast: Secondary | ICD-10-CM | POA: Insufficient documentation

## 2021-06-15 DIAGNOSIS — Z79899 Other long term (current) drug therapy: Secondary | ICD-10-CM | POA: Diagnosis not present

## 2021-06-15 NOTE — Progress Notes (Signed)
See MD note for nursing evaluation. °

## 2021-06-19 ENCOUNTER — Encounter: Payer: Self-pay | Admitting: *Deleted

## 2021-06-22 ENCOUNTER — Ambulatory Visit
Admission: RE | Admit: 2021-06-22 | Discharge: 2021-06-22 | Disposition: A | Discharge status: Home / Self Care | Payer: Medicare PPO | Source: Ambulatory Visit | Attending: Radiation Oncology | Admitting: Radiation Oncology

## 2021-06-22 ENCOUNTER — Other Ambulatory Visit: Payer: Self-pay

## 2021-06-22 DIAGNOSIS — C50411 Malignant neoplasm of upper-outer quadrant of right female breast: Secondary | ICD-10-CM | POA: Diagnosis not present

## 2021-06-22 DIAGNOSIS — Z79899 Other long term (current) drug therapy: Secondary | ICD-10-CM | POA: Diagnosis not present

## 2021-06-22 DIAGNOSIS — I10 Essential (primary) hypertension: Secondary | ICD-10-CM | POA: Insufficient documentation

## 2021-06-22 DIAGNOSIS — Z170 Estrogen receptor positive status [ER+]: Secondary | ICD-10-CM | POA: Insufficient documentation

## 2021-06-22 DIAGNOSIS — E039 Hypothyroidism, unspecified: Secondary | ICD-10-CM | POA: Diagnosis not present

## 2021-06-22 DIAGNOSIS — Z510 Encounter for antineoplastic radiation therapy: Secondary | ICD-10-CM | POA: Diagnosis not present

## 2021-06-23 ENCOUNTER — Encounter: Payer: Self-pay | Admitting: *Deleted

## 2021-06-27 DIAGNOSIS — Z170 Estrogen receptor positive status [ER+]: Secondary | ICD-10-CM | POA: Diagnosis not present

## 2021-06-27 DIAGNOSIS — C50411 Malignant neoplasm of upper-outer quadrant of right female breast: Secondary | ICD-10-CM | POA: Diagnosis not present

## 2021-06-27 DIAGNOSIS — Z79899 Other long term (current) drug therapy: Secondary | ICD-10-CM | POA: Diagnosis not present

## 2021-06-27 DIAGNOSIS — Z510 Encounter for antineoplastic radiation therapy: Secondary | ICD-10-CM | POA: Diagnosis not present

## 2021-06-27 DIAGNOSIS — I10 Essential (primary) hypertension: Secondary | ICD-10-CM | POA: Diagnosis not present

## 2021-06-27 DIAGNOSIS — E039 Hypothyroidism, unspecified: Secondary | ICD-10-CM | POA: Diagnosis not present

## 2021-06-29 ENCOUNTER — Ambulatory Visit
Admission: RE | Admit: 2021-06-29 | Discharge: 2021-06-29 | Disposition: A | Discharge status: Home / Self Care | Payer: Medicare PPO | Source: Ambulatory Visit | Attending: Radiation Oncology | Admitting: Radiation Oncology

## 2021-06-29 ENCOUNTER — Other Ambulatory Visit: Payer: Self-pay

## 2021-06-29 DIAGNOSIS — Z510 Encounter for antineoplastic radiation therapy: Secondary | ICD-10-CM | POA: Diagnosis not present

## 2021-06-29 DIAGNOSIS — Z79899 Other long term (current) drug therapy: Secondary | ICD-10-CM | POA: Diagnosis not present

## 2021-06-29 DIAGNOSIS — Z170 Estrogen receptor positive status [ER+]: Secondary | ICD-10-CM | POA: Diagnosis not present

## 2021-06-29 DIAGNOSIS — E039 Hypothyroidism, unspecified: Secondary | ICD-10-CM | POA: Diagnosis not present

## 2021-06-29 DIAGNOSIS — I10 Essential (primary) hypertension: Secondary | ICD-10-CM | POA: Diagnosis not present

## 2021-06-29 DIAGNOSIS — C50411 Malignant neoplasm of upper-outer quadrant of right female breast: Secondary | ICD-10-CM | POA: Diagnosis not present

## 2021-06-30 ENCOUNTER — Other Ambulatory Visit: Payer: Self-pay

## 2021-06-30 ENCOUNTER — Ambulatory Visit
Admission: RE | Admit: 2021-06-30 | Discharge: 2021-06-30 | Disposition: A | Discharge status: Home / Self Care | Payer: Medicare PPO | Source: Ambulatory Visit | Attending: Radiation Oncology | Admitting: Radiation Oncology

## 2021-06-30 DIAGNOSIS — Z170 Estrogen receptor positive status [ER+]: Secondary | ICD-10-CM | POA: Diagnosis not present

## 2021-06-30 DIAGNOSIS — Z510 Encounter for antineoplastic radiation therapy: Secondary | ICD-10-CM | POA: Diagnosis not present

## 2021-06-30 DIAGNOSIS — E039 Hypothyroidism, unspecified: Secondary | ICD-10-CM | POA: Diagnosis not present

## 2021-06-30 DIAGNOSIS — I10 Essential (primary) hypertension: Secondary | ICD-10-CM | POA: Diagnosis not present

## 2021-06-30 DIAGNOSIS — Z79899 Other long term (current) drug therapy: Secondary | ICD-10-CM | POA: Diagnosis not present

## 2021-06-30 DIAGNOSIS — C50411 Malignant neoplasm of upper-outer quadrant of right female breast: Secondary | ICD-10-CM | POA: Diagnosis not present

## 2021-07-02 ENCOUNTER — Ambulatory Visit
Admission: RE | Admit: 2021-07-02 | Discharge: 2021-07-02 | Disposition: A | Discharge status: Home / Self Care | Payer: Medicare PPO | Source: Ambulatory Visit | Attending: Radiation Oncology | Admitting: Radiation Oncology

## 2021-07-02 DIAGNOSIS — I10 Essential (primary) hypertension: Secondary | ICD-10-CM | POA: Diagnosis not present

## 2021-07-02 DIAGNOSIS — E039 Hypothyroidism, unspecified: Secondary | ICD-10-CM | POA: Diagnosis not present

## 2021-07-02 DIAGNOSIS — Z510 Encounter for antineoplastic radiation therapy: Secondary | ICD-10-CM | POA: Diagnosis not present

## 2021-07-02 DIAGNOSIS — Z79899 Other long term (current) drug therapy: Secondary | ICD-10-CM | POA: Diagnosis not present

## 2021-07-02 DIAGNOSIS — C50411 Malignant neoplasm of upper-outer quadrant of right female breast: Secondary | ICD-10-CM | POA: Diagnosis not present

## 2021-07-02 DIAGNOSIS — Z170 Estrogen receptor positive status [ER+]: Secondary | ICD-10-CM | POA: Diagnosis not present

## 2021-07-03 ENCOUNTER — Other Ambulatory Visit: Payer: Self-pay

## 2021-07-03 ENCOUNTER — Ambulatory Visit
Admission: RE | Admit: 2021-07-03 | Discharge: 2021-07-03 | Disposition: A | Discharge status: Home / Self Care | Payer: Medicare PPO | Source: Ambulatory Visit | Attending: Radiation Oncology | Admitting: Radiation Oncology

## 2021-07-03 DIAGNOSIS — I10 Essential (primary) hypertension: Secondary | ICD-10-CM | POA: Diagnosis not present

## 2021-07-03 DIAGNOSIS — Z510 Encounter for antineoplastic radiation therapy: Secondary | ICD-10-CM | POA: Diagnosis not present

## 2021-07-03 DIAGNOSIS — C50411 Malignant neoplasm of upper-outer quadrant of right female breast: Secondary | ICD-10-CM | POA: Diagnosis not present

## 2021-07-03 DIAGNOSIS — E039 Hypothyroidism, unspecified: Secondary | ICD-10-CM | POA: Diagnosis not present

## 2021-07-03 DIAGNOSIS — Z79899 Other long term (current) drug therapy: Secondary | ICD-10-CM | POA: Diagnosis not present

## 2021-07-03 DIAGNOSIS — Z170 Estrogen receptor positive status [ER+]: Secondary | ICD-10-CM | POA: Diagnosis not present

## 2021-07-03 NOTE — Progress Notes (Signed)
[facility]  [phone] [phone]     ID: KATELY GRAFFAM DOB: Jul 29, 1941  MR#: [id number]  VPX#:[id number]  Patient Care Team: Ginger Organ., MD as PCP - General (Internal Medicine) Mauro Kaufmann, RN as Oncology Nurse Navigator Rockwell Germany, RN as Oncology Nurse Navigator Stark Klein, MD as Consulting Physician (General Surgery) Blondina Coderre, Virgie Dad, MD as Consulting Physician (Oncology) Gery Pray, MD as Consulting Physician (Radiation Oncology) Chauncey Cruel, MD OTHER MD:  CHIEF COMPLAINT: Estrogen receptor positive breast cancer  CURRENT TREATMENT: To start tamoxifen August 13, 2021   INTERVAL HISTORY: Joelys returns today for follow up of her estrogen receptor positive breast cancer.    She underwent right lumpectomy on 04/28/2021 under Dr. Barry Dienes. Pathology from the procedure ([id number]) showed: invasive ductal carcinoma, grade 2, spanning 2.4 cm; intermediate grade ductal carcinoma in situ; invasive carcinoma present at inferior margin broadly; atypical lobular hyperplasia.  All 5 biopsied lymph nodes were negative for carcinoma (0/5).  She proceeded to re-excision of the positive margin on 05/15/2021. Pathology [id number]) showed only focal atypical lobular hyperplasia, negative for carcinoma.  She met back with Dr. Sondra Come on 06/15/2021 to review radiation therapy. She subsequently began treatment on 06/29/2021 and is scheduled to finish on 07/21/2021.   REVIEW OF SYSTEMS: Graciella is tolerating radiation remarkably well.  She does all her usual activities and of course she is her husband's primary caregiver.  He has progressive dementia and hospice is already involved.  A detailed review of systems today was otherwise noncontributory   COVID 19 VACCINATION STATUS: Status post Pfizer x4 as of September 2022   HISTORY OF CURRENT ILLNESS: From the original intake note:  NEISHA HINGER has a history of stage T1c  left breast cancer, status post lumpectomy 06/2006, radiation therapy, and 5 years of Femara under the care of Dr. Truddie Coco.  More recently, she had routine screening mammography on 03/09/2021 showing a possible abnormality in the right breast. She underwent right diagnostic mammography with tomography and right breast ultrasonography at [facility] on 03/20/2021 showing: breast density category C; 2.2 cm irregular right breast mass at 10 o'clock; no right axillary adenopathy.  Accordingly on 03/23/2021 she proceeded to biopsy of the right breast area in question. The pathology from this procedure ([id number]) showed: invasive and in situ mammary carcinoma, e-cadherin positive, grade 1/2. Prognostic indicators significant for: estrogen receptor, 100% positive and progesterone receptor, 95% positive, both with strong staining intensity. Proliferation marker Ki67 at 1%. HER2 equivocal by immunohistochemistry (2+), but negative by fluorescent in situ hybridization with a signals ratio 1.27 and number per cell 1.65.  She underwent breast MRI on 03/28/2021 showing: breast composition C; 1.5 cm biopsy-proven malignancy in upper-outer right breast; indeterminate 1.2 cm focal non-mass enhancement in lower-outer right breast; no evidence of malignancy in left breast; no suspicious lymphadenopathy.  She proceeded to additional right breast biopsies on 04/13/2021. Pathology [id number]) showed: 1. Right Breast, lower outer  - increased stromal fibrosis with fibrocystic changes, duct ectasia and calcifications 2. Right Breast, lateral mid depth  - stromal fibrosis with lobular neoplasia and adenosis  She is scheduled for definitive surgery on 04/28/2021.   Cancer Staging  Malignant neoplasm of upper-outer quadrant of right breast in female, estrogen receptor positive ([place]) Staging form: Breast, AJCC 8th Edition - Clinical: Stage IA (cT1c, cN0, cM0, G1, ER+, PR+, HER2-) - Signed by Chauncey Cruel, MD on  04/27/2021 Histologic grading system: 3 grade system  The  patient's subsequent history is as detailed below.   PAST MEDICAL HISTORY: Past Medical History:  Diagnosis Date   Anemia    Breast CA ([place])    Left   GERD (gastroesophageal reflux disease)    History of cataract    History of hiatal hernia    Hypertension 2022   Hypothyroidism    OA (osteoarthritis)    Personal history of radiation therapy    PONV (postoperative nausea and vomiting)    Vitamin D deficiency     PAST SURGICAL HISTORY: Past Surgical History:  Procedure Laterality Date   BREAST EXCISIONAL BIOPSY Left    BREAST LUMPECTOMY Left    BREAST LUMPECTOMY WITH RADIOACTIVE SEED AND SENTINEL LYMPH NODE BIOPSY Right 04/28/2021   Procedure: RIGHT BREAST LUMPECTOMY WITH RADIOACTIVE SEED x 2 AND SENTINEL LYMPH NODE BIOPSY;  Surgeon: Stark Klein, MD;  Location: [facility];  Service: General;  Laterality: Right;   CATARACT EXTRACTION, BILATERAL     COLONOSCOPY  01/03/2007   RE-EXCISION OF BREAST LUMPECTOMY Right 05/15/2021   Procedure: RE-EXCISION OF RIGHT BREAST LUMPECTOMY;  Surgeon: Stark Klein, MD;  Location: [facility];  Service: General;  Laterality: Right;   TOTAL HIP ARTHROPLASTY Left 03/06/2019   Procedure: LEFT TOTAL HIP ARTHROPLASTY ANTERIOR APPROACH;  Surgeon: Mcarthur Rossetti, MD;  Location: [facility];  Service: Orthopedics;  Laterality: Left;   UPPER GI ENDOSCOPY  09/23/2007    FAMILY HISTORY: Family History  Problem Relation Age of Onset   Kidney cancer Father    Breast cancer Sister    Kidney cancer Sister    Bladder Cancer Maternal Aunt    Breast cancer Cousin        mat first cousin  The patient's father had a history of renal cancer, subsequently dying in his 29s from other causes.  The patient's mother also died at an advanced age.  1 of 2 maternal aunts had bladder cancer and 1 maternal cousin had breast cancer.  The patient has 1 brother, 4 sisters.  1 sister died from renal  cancer, 1 sister [name]) has a history of breast cancer   GYNECOLOGIC HISTORY:  No LMP recorded. Patient is postmenopausal. Menarche: 80 years old [place] P 0 LMP 48 HRT more than 10 yearsHysterectomy? no BSO? 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  Her husband [name] worked for [employer].  He now has progressive dementia and is increasingly housebound.  Hospice is involved.  Her (adopted) children are [name], 45, who lives in [place] is a Pharmacist, hospital in middle school and has 5 children; and [name], who has ADHD and bipolar issues is age 78 and lives with the patient.  Glori attends [employer]    ADVANCED DIRECTIVES: In the absence of any documentation to the contrary, the patient's spouse is their HCPOA.   HEALTH MAINTENANCE: Social History   Tobacco Use   Smoking status: Never   Smokeless tobacco: Never  Vaping Use   Vaping Use: Never used  Substance Use Topics   Alcohol use: Yes    Comment: maybe once a month   Drug use: Never     No Known Allergies  Current Outpatient Medications  Medication Sig Dispense Refill   tamoxifen (NOLVADEX) 20 MG tablet Take 1 tablet (20 mg total) by mouth daily. Start August 13, 2021 90 tablet 4   B Complex-C (SUPER B COMPLEX PO) Take 1 tablet by  mouth daily.     cholecalciferol (VITAMIN D3) 25 MCG (1000 UT) tablet Take 1,000 Units by mouth daily.     levothyroxine (SYNTHROID) 75 MCG tablet Take 75 mcg by mouth daily before breakfast.      olmesartan (BENICAR) 20 MG tablet Take 20 mg by mouth daily.     omeprazole (PRILOSEC) 20 MG capsule Take 20 mg by mouth daily as needed (acid reflux).     POLY-IRON 150 150 MG capsule Take 150 mg by mouth daily.      No current facility-administered medications for this visit.    OBJECTIVE: White woman in no acute distress  Vitals:   07/04/21 1332  BP: (!) 142/62  Pulse: 68  Resp: 16  Temp: 97.7 F  (36.5 C)  SpO2: 100%     Body mass index is 21.53 kg/m.   Wt Readings from Last 3 Encounters:  07/04/21 133 lb 6.4 oz (60.5 kg)  06/15/21 135 lb 11.2 oz (61.6 kg)  05/15/21 131 lb (59.4 kg)      ECOG FS:1 - Symptomatic but completely ambulatory  Ocular: Sclerae unicteric, pupils round and equal Ear-nose-throat: Wearing a mask Lymphatic: No cervical or supraclavicular adenopathy Lungs no rales or rhonchi Heart regular rate and rhythm Abd soft, nontender, positive bowel sounds MSK no focal spinal tenderness, no joint edema Neuro: non-focal, well-oriented, appropriate affect Breasts: The right breast is benign per the left breast is postlumpectomy and is currently receiving radiation.  There is minimal hyperpigmentation.  The overall cosmetic result is good.  There is no evidence of residual or recurrent disease.  Both axillae are benign.   LAB RESULTS:  CMP     Component Value Date/Time   NA 139 04/19/2021 1000   K 4.0 04/19/2021 1000   CL 107 04/19/2021 1000   CO2 26 04/19/2021 1000   GLUCOSE 98 04/19/2021 1000   BUN 30 (H) 04/19/2021 1000   CREATININE 0.78 04/19/2021 1000   CALCIUM 9.2 04/19/2021 1000   PROT 6.5 07/10/2011 1526   ALBUMIN 4.2 07/10/2011 1526   AST 35 07/10/2011 1526   ALT 30 07/10/2011 1526   ALKPHOS 79 07/10/2011 1526   BILITOT 0.3 07/10/2011 1526   GFRNONAA >60 04/19/2021 1000   GFRAA >60 03/07/2019 0318    No results found for: TOTALPROTELP, ALBUMINELP, A1GS, A2GS, BETS, BETA2SER, GAMS, MSPIKE, SPEI  Lab Results  Component Value Date   WBC 5.4 04/19/2021   NEUTROABS 4.3 07/10/2011   HGB 12.6 04/19/2021   HCT 38.8 04/19/2021   MCV 93.9 04/19/2021   PLT 284 04/19/2021    Lab Results  Component Value Date   LABCA2 16 12/05/2010    No components found for: [id number]  No results for input(s): INR in the last 168 hours.  Lab Results  Component Value Date   LABCA2 16 12/05/2010    No results found for: WGY659  No results found  for: DJT701  No results found for: XBL390  No results found for: CA2729  No components found for: HGQUANT  No results found for: CEA1 / No results found for: CEA1   No results found for: AFPTUMOR  No results found for: CHROMOGRNA  No results found for: KPAFRELGTCHN, LAMBDASER, KAPLAMBRATIO (kappa/lambda light chains)  No results found for: HGBA, HGBA2QUANT, HGBFQUANT, HGBSQUAN (Hemoglobinopathy evaluation)   Lab Results  Component Value Date   LDH 175 06/05/2010    No results found for: IRON, TIBC, IRONPCTSAT (Iron and TIBC)  No results found for: FERRITIN  Urinalysis  No results found for: COLORURINE, APPEARANCEUR, LABSPEC, PHURINE, GLUCOSEU, HGBUR, BILIRUBINUR, KETONESUR, PROTEINUR, UROBILINOGEN, NITRITE, LEUKOCYTESUR   STUDIES: No results found.   ELIGIBLE FOR AVAILABLE RESEARCH PROTOCOL: no  ASSESSMENT: 80 y.o. [place] woman with bilateral breast cancers as follows   LEFT BREAST CANCER: (0)status post left breast upper outer quadrant biopsy 06/12/2006 for invasive ductal carcinoma, grade 1,  (1) status post left lumpectomy with no sentinel lymph node sampling 07/08/2016 for a pT1c pNX, stage IA invasive ductal carcinoma, grade 1, estrogen and progesterone receptor positive, with an MIB-1 of 3%.  (2) status post adjuvant radiation to the left breast  (3) status post letrozole x5 years  RIGHT BREAST CANCER: (4) status post biopsy of the right breast upper outer quadrant 03/23/2021 for a clinical T2 N0, stage IA invasive ductal carcinoma, grade 1 or 2, estrogen receptor and progesterone receptor strongly positive, MIB-1 1%, with no amplification of HER2.  (5) genetics testing  (6) status post right lumpectomy and sentinel lymph node sampling on 04/28/2021 for a  pT2 pN0, stage IB invasive ductal carcinoma, grade 2, with a positive margin  (a) a total of 5 right axillary lymph nodes were removed  (b) additional surgery 05/16/2019 to clear the margins  (7)  adjuvant radiation to be completed 07/21/2021  (8) to start tamoxifen August 13, 2021   PLAN: Roshanna is tolerating adjuvant radiation remarkably well and she will finish in December as noted.  We reviewed her pathology which shows strongly estrogen dependent cancer and we also talked about her longevity, which could well exceed 10 more years, as well as what would happen if she has recurrent breast cancer in either breast in the future.  She understands that antiestrogens would significantly reduce that risk.  We discussed the difference between tamoxifen and anastrozole in detail. She understands that anastrozole and the aromatase inhibitors in general work by blocking estrogen production. Accordingly vaginal dryness, decrease in bone density, and of course hot flashes can result. The aromatase inhibitors can also negatively affect the cholesterol profile, although that is a minor effect. One out of 5 women on aromatase inhibitors we will feel "old and achy". This arthralgia/myalgia syndrome, which resembles fibromyalgia clinically, does resolve with stopping the medications. Accordingly this is not a reason to not try an aromatase inhibitor but it is a frequent reason to stop it (in other words 20% of women will not be able to tolerate these medications).  Tamoxifen on the other hand does not block estrogen production. It does not "take away a woman's estrogen". It blocks the estrogen receptor in breast cells. Like anastrozole, it can also cause hot flashes. As opposed to anastrozole, tamoxifen has many estrogen-like effects. It is technically an estrogen receptor modulator. This means that in some tissues tamoxifen works like estrogen-- for example it helps strengthen the bones. It tends to improve the cholesterol profile. It can cause thickening of the endometrial lining, and even endometrial polyps or rarely cancer of the uterus.(The risk of uterine cancer due to tamoxifen is one additional cancer per  thousand women year). It can cause vaginal wetness or stickiness. It can cause blood clots through this estrogen-like effect--the risk of blood clots with tamoxifen is exactly the same as with birth control pills or hormone replacement.  Neither of these agents causes mood changes or weight gain, despite the popular belief that they can have these side effects. We have data from studies comparing either of these drugs with placebo, and in those cases the control  group had the same amount of weight gain and depression as the group that took the drug.  Since she took an aromatase inhibitor previously, I think tamoxifen would be a better choice for her this time.  It will also of course help with bone density issues.  We did discuss the possibility of blood clots and a very rare cases of endometrial cancer as well as other possible side effects toxicities and complications.  I think it would be useful if we started a few weeks after she finishes radiation and so August 13, 2021 is the target start date.  She is going to see [place] again about 6 weeks later just to make sure she is tolerating tamoxifen well and if so the plan would be to continue that a total of 5 years.  Unfortunately her husband's declining health is placing a great deal of stress on Priti.  She is holding up bravely but at some point it may not be possible for him to be safe and comfortable at home.  They do have hospice already involved.  Total encounter time 25 minutes.Sarajane Jews C. Sandie Swayze, MD 07/04/2021 3:43 PM Medical Oncology and Hematology [facility] [place], [place] [address] Tel. [phone]    Fax. [phone]   This document serves as a record of services personally performed by Lurline Del, MD. It was created on his behalf by Wilburn Mylar, a trained medical scribe. The creation of this record is based on the scribe's personal observations and the provider's statements to them.    I, Lurline Del MD, have reviewed the above documentation for accuracy and completeness, and I agree with the above.   *Total Encounter Time as defined by the Centers for Medicare and Medicaid Services includes, in addition to the face-to-face time of a patient visit (documented in the note above) non-face-to-face time: obtaining and reviewing outside history, ordering and reviewing medications, tests or procedures, care coordination (communications with other health care professionals or caregivers) and documentation in the medical record.

## 2021-07-04 ENCOUNTER — Ambulatory Visit
Admission: RE | Admit: 2021-07-04 | Discharge: 2021-07-04 | Disposition: A | Discharge status: Home / Self Care | Payer: Medicare PPO | Source: Ambulatory Visit | Attending: Radiation Oncology | Admitting: Radiation Oncology

## 2021-07-04 ENCOUNTER — Inpatient Hospital Stay: Payer: Medicare PPO | Admitting: Oncology

## 2021-07-04 VITALS — BP 142/62 | HR 68 | Temp 97.7°F | Resp 16 | Ht 66.0 in | Wt 133.4 lb

## 2021-07-04 DIAGNOSIS — I10 Essential (primary) hypertension: Secondary | ICD-10-CM | POA: Diagnosis not present

## 2021-07-04 DIAGNOSIS — Z170 Estrogen receptor positive status [ER+]: Secondary | ICD-10-CM

## 2021-07-04 DIAGNOSIS — Z79899 Other long term (current) drug therapy: Secondary | ICD-10-CM | POA: Diagnosis not present

## 2021-07-04 DIAGNOSIS — C50411 Malignant neoplasm of upper-outer quadrant of right female breast: Secondary | ICD-10-CM

## 2021-07-04 DIAGNOSIS — Z510 Encounter for antineoplastic radiation therapy: Secondary | ICD-10-CM | POA: Diagnosis not present

## 2021-07-04 DIAGNOSIS — E039 Hypothyroidism, unspecified: Secondary | ICD-10-CM | POA: Insufficient documentation

## 2021-07-04 MED ORDER — TAMOXIFEN CITRATE 20 MG PO TABS: 20.0000 mg | ORAL_TABLET | Freq: Every day | ORAL | 4 refills | Status: AC

## 2021-07-04 MED ORDER — ALRA NON-METALLIC DEODORANT (RAD-ONC)
1.0000 "application " | Freq: Once | TOPICAL | Status: AC
  Administered 2021-07-04: 1 via TOPICAL

## 2021-07-04 MED ORDER — RADIAPLEXRX EX GEL: Freq: Once | CUTANEOUS | Status: AC

## 2021-07-04 NOTE — Progress Notes (Signed)

## 2021-07-05 ENCOUNTER — Other Ambulatory Visit: Payer: Self-pay

## 2021-07-05 ENCOUNTER — Ambulatory Visit
Admission: RE | Admit: 2021-07-05 | Discharge: 2021-07-05 | Disposition: A | Discharge status: Home / Self Care | Payer: Medicare PPO | Source: Ambulatory Visit | Attending: Radiation Oncology | Admitting: Radiation Oncology

## 2021-07-05 DIAGNOSIS — E039 Hypothyroidism, unspecified: Secondary | ICD-10-CM | POA: Diagnosis not present

## 2021-07-05 DIAGNOSIS — Z510 Encounter for antineoplastic radiation therapy: Secondary | ICD-10-CM | POA: Diagnosis not present

## 2021-07-05 DIAGNOSIS — I10 Essential (primary) hypertension: Secondary | ICD-10-CM | POA: Diagnosis not present

## 2021-07-05 DIAGNOSIS — Z79899 Other long term (current) drug therapy: Secondary | ICD-10-CM | POA: Diagnosis not present

## 2021-07-05 DIAGNOSIS — Z170 Estrogen receptor positive status [ER+]: Secondary | ICD-10-CM | POA: Diagnosis not present

## 2021-07-05 DIAGNOSIS — C50411 Malignant neoplasm of upper-outer quadrant of right female breast: Secondary | ICD-10-CM | POA: Diagnosis not present

## 2021-07-10 ENCOUNTER — Other Ambulatory Visit: Payer: Self-pay

## 2021-07-10 ENCOUNTER — Ambulatory Visit
Admission: RE | Admit: 2021-07-10 | Discharge: 2021-07-10 | Disposition: A | Discharge status: Home / Self Care | Payer: Medicare PPO | Source: Ambulatory Visit | Attending: Radiation Oncology | Admitting: Radiation Oncology

## 2021-07-10 DIAGNOSIS — Z79899 Other long term (current) drug therapy: Secondary | ICD-10-CM | POA: Diagnosis not present

## 2021-07-10 DIAGNOSIS — C50411 Malignant neoplasm of upper-outer quadrant of right female breast: Secondary | ICD-10-CM | POA: Diagnosis not present

## 2021-07-10 DIAGNOSIS — Z170 Estrogen receptor positive status [ER+]: Secondary | ICD-10-CM | POA: Diagnosis not present

## 2021-07-10 DIAGNOSIS — I10 Essential (primary) hypertension: Secondary | ICD-10-CM | POA: Diagnosis not present

## 2021-07-10 DIAGNOSIS — E039 Hypothyroidism, unspecified: Secondary | ICD-10-CM | POA: Diagnosis not present

## 2021-07-10 DIAGNOSIS — Z510 Encounter for antineoplastic radiation therapy: Secondary | ICD-10-CM | POA: Diagnosis not present

## 2021-07-11 ENCOUNTER — Ambulatory Visit
Admission: RE | Admit: 2021-07-11 | Discharge: 2021-07-11 | Disposition: A | Discharge status: Home / Self Care | Payer: Medicare PPO | Source: Ambulatory Visit | Attending: Radiation Oncology | Admitting: Radiation Oncology

## 2021-07-11 DIAGNOSIS — I10 Essential (primary) hypertension: Secondary | ICD-10-CM | POA: Diagnosis not present

## 2021-07-11 DIAGNOSIS — E039 Hypothyroidism, unspecified: Secondary | ICD-10-CM | POA: Diagnosis not present

## 2021-07-11 DIAGNOSIS — Z510 Encounter for antineoplastic radiation therapy: Secondary | ICD-10-CM | POA: Diagnosis not present

## 2021-07-11 DIAGNOSIS — Z79899 Other long term (current) drug therapy: Secondary | ICD-10-CM | POA: Diagnosis not present

## 2021-07-11 DIAGNOSIS — Z170 Estrogen receptor positive status [ER+]: Secondary | ICD-10-CM | POA: Diagnosis not present

## 2021-07-11 DIAGNOSIS — C50411 Malignant neoplasm of upper-outer quadrant of right female breast: Secondary | ICD-10-CM | POA: Diagnosis not present

## 2021-07-12 ENCOUNTER — Ambulatory Visit
Admission: RE | Admit: 2021-07-12 | Discharge: 2021-07-12 | Disposition: A | Discharge status: Home / Self Care | Payer: Medicare PPO | Source: Ambulatory Visit | Attending: Radiation Oncology | Admitting: Radiation Oncology

## 2021-07-12 ENCOUNTER — Other Ambulatory Visit: Payer: Self-pay

## 2021-07-12 DIAGNOSIS — E039 Hypothyroidism, unspecified: Secondary | ICD-10-CM | POA: Diagnosis not present

## 2021-07-12 DIAGNOSIS — I10 Essential (primary) hypertension: Secondary | ICD-10-CM | POA: Diagnosis not present

## 2021-07-12 DIAGNOSIS — Z170 Estrogen receptor positive status [ER+]: Secondary | ICD-10-CM | POA: Diagnosis not present

## 2021-07-12 DIAGNOSIS — Z510 Encounter for antineoplastic radiation therapy: Secondary | ICD-10-CM | POA: Diagnosis not present

## 2021-07-12 DIAGNOSIS — C50411 Malignant neoplasm of upper-outer quadrant of right female breast: Secondary | ICD-10-CM | POA: Diagnosis not present

## 2021-07-12 DIAGNOSIS — Z79899 Other long term (current) drug therapy: Secondary | ICD-10-CM | POA: Diagnosis not present

## 2021-07-13 ENCOUNTER — Ambulatory Visit
Admission: RE | Admit: 2021-07-13 | Discharge: 2021-07-13 | Disposition: A | Discharge status: Home / Self Care | Payer: Medicare PPO | Source: Ambulatory Visit | Attending: Radiation Oncology | Admitting: Radiation Oncology

## 2021-07-13 DIAGNOSIS — C50411 Malignant neoplasm of upper-outer quadrant of right female breast: Secondary | ICD-10-CM | POA: Insufficient documentation

## 2021-07-13 DIAGNOSIS — E039 Hypothyroidism, unspecified: Secondary | ICD-10-CM | POA: Diagnosis not present

## 2021-07-13 DIAGNOSIS — I10 Essential (primary) hypertension: Secondary | ICD-10-CM | POA: Insufficient documentation

## 2021-07-13 DIAGNOSIS — Z79899 Other long term (current) drug therapy: Secondary | ICD-10-CM | POA: Insufficient documentation

## 2021-07-13 DIAGNOSIS — Z510 Encounter for antineoplastic radiation therapy: Secondary | ICD-10-CM | POA: Diagnosis not present

## 2021-07-13 DIAGNOSIS — Z170 Estrogen receptor positive status [ER+]: Secondary | ICD-10-CM | POA: Diagnosis not present

## 2021-07-14 ENCOUNTER — Other Ambulatory Visit: Payer: Self-pay

## 2021-07-14 ENCOUNTER — Ambulatory Visit
Admission: RE | Admit: 2021-07-14 | Discharge: 2021-07-14 | Disposition: A | Discharge status: Home / Self Care | Payer: Medicare PPO | Source: Ambulatory Visit | Attending: Radiation Oncology | Admitting: Radiation Oncology

## 2021-07-14 DIAGNOSIS — Z79899 Other long term (current) drug therapy: Secondary | ICD-10-CM | POA: Diagnosis not present

## 2021-07-14 DIAGNOSIS — E039 Hypothyroidism, unspecified: Secondary | ICD-10-CM | POA: Diagnosis not present

## 2021-07-14 DIAGNOSIS — Z510 Encounter for antineoplastic radiation therapy: Secondary | ICD-10-CM | POA: Diagnosis not present

## 2021-07-14 DIAGNOSIS — I10 Essential (primary) hypertension: Secondary | ICD-10-CM | POA: Diagnosis not present

## 2021-07-14 DIAGNOSIS — Z170 Estrogen receptor positive status [ER+]: Secondary | ICD-10-CM | POA: Diagnosis not present

## 2021-07-14 DIAGNOSIS — C50411 Malignant neoplasm of upper-outer quadrant of right female breast: Secondary | ICD-10-CM | POA: Diagnosis not present

## 2021-07-17 ENCOUNTER — Other Ambulatory Visit: Payer: Self-pay

## 2021-07-17 ENCOUNTER — Ambulatory Visit
Admission: RE | Admit: 2021-07-17 | Discharge: 2021-07-17 | Disposition: A | Discharge status: Home / Self Care | Payer: Medicare PPO | Source: Ambulatory Visit | Attending: Radiation Oncology | Admitting: Radiation Oncology

## 2021-07-17 DIAGNOSIS — Z510 Encounter for antineoplastic radiation therapy: Secondary | ICD-10-CM | POA: Diagnosis not present

## 2021-07-17 DIAGNOSIS — E039 Hypothyroidism, unspecified: Secondary | ICD-10-CM | POA: Diagnosis not present

## 2021-07-17 DIAGNOSIS — I10 Essential (primary) hypertension: Secondary | ICD-10-CM | POA: Diagnosis not present

## 2021-07-17 DIAGNOSIS — C50411 Malignant neoplasm of upper-outer quadrant of right female breast: Secondary | ICD-10-CM | POA: Diagnosis not present

## 2021-07-17 DIAGNOSIS — Z170 Estrogen receptor positive status [ER+]: Secondary | ICD-10-CM | POA: Diagnosis not present

## 2021-07-17 DIAGNOSIS — Z79899 Other long term (current) drug therapy: Secondary | ICD-10-CM | POA: Diagnosis not present

## 2021-07-18 ENCOUNTER — Ambulatory Visit
Admission: RE | Admit: 2021-07-18 | Discharge: 2021-07-18 | Disposition: A | Discharge status: Home / Self Care | Payer: Medicare PPO | Source: Ambulatory Visit | Attending: Radiation Oncology | Admitting: Radiation Oncology

## 2021-07-18 DIAGNOSIS — E039 Hypothyroidism, unspecified: Secondary | ICD-10-CM | POA: Diagnosis not present

## 2021-07-18 DIAGNOSIS — C50411 Malignant neoplasm of upper-outer quadrant of right female breast: Secondary | ICD-10-CM | POA: Diagnosis not present

## 2021-07-18 DIAGNOSIS — Z510 Encounter for antineoplastic radiation therapy: Secondary | ICD-10-CM | POA: Diagnosis not present

## 2021-07-18 DIAGNOSIS — Z170 Estrogen receptor positive status [ER+]: Secondary | ICD-10-CM | POA: Diagnosis not present

## 2021-07-18 DIAGNOSIS — Z79899 Other long term (current) drug therapy: Secondary | ICD-10-CM | POA: Diagnosis not present

## 2021-07-18 DIAGNOSIS — I10 Essential (primary) hypertension: Secondary | ICD-10-CM | POA: Diagnosis not present

## 2021-07-19 ENCOUNTER — Ambulatory Visit
Admission: RE | Admit: 2021-07-19 | Discharge: 2021-07-19 | Disposition: A | Discharge status: Home / Self Care | Payer: Medicare PPO | Source: Ambulatory Visit | Attending: Radiation Oncology | Admitting: Radiation Oncology

## 2021-07-19 ENCOUNTER — Other Ambulatory Visit: Payer: Self-pay

## 2021-07-19 DIAGNOSIS — Z510 Encounter for antineoplastic radiation therapy: Secondary | ICD-10-CM | POA: Diagnosis not present

## 2021-07-19 DIAGNOSIS — Z79899 Other long term (current) drug therapy: Secondary | ICD-10-CM | POA: Diagnosis not present

## 2021-07-19 DIAGNOSIS — Z170 Estrogen receptor positive status [ER+]: Secondary | ICD-10-CM | POA: Diagnosis not present

## 2021-07-19 DIAGNOSIS — E039 Hypothyroidism, unspecified: Secondary | ICD-10-CM | POA: Diagnosis not present

## 2021-07-19 DIAGNOSIS — I10 Essential (primary) hypertension: Secondary | ICD-10-CM | POA: Diagnosis not present

## 2021-07-19 DIAGNOSIS — C50411 Malignant neoplasm of upper-outer quadrant of right female breast: Secondary | ICD-10-CM | POA: Diagnosis not present

## 2021-07-20 ENCOUNTER — Ambulatory Visit
Admission: RE | Admit: 2021-07-20 | Discharge: 2021-07-20 | Disposition: A | Discharge status: Home / Self Care | Payer: Medicare PPO | Source: Ambulatory Visit | Attending: Radiation Oncology | Admitting: Radiation Oncology

## 2021-07-20 ENCOUNTER — Encounter: Payer: Self-pay | Admitting: *Deleted

## 2021-07-20 DIAGNOSIS — Z170 Estrogen receptor positive status [ER+]: Secondary | ICD-10-CM | POA: Diagnosis not present

## 2021-07-20 DIAGNOSIS — E039 Hypothyroidism, unspecified: Secondary | ICD-10-CM | POA: Diagnosis not present

## 2021-07-20 DIAGNOSIS — C50411 Malignant neoplasm of upper-outer quadrant of right female breast: Secondary | ICD-10-CM | POA: Diagnosis not present

## 2021-07-20 DIAGNOSIS — I10 Essential (primary) hypertension: Secondary | ICD-10-CM | POA: Diagnosis not present

## 2021-07-20 DIAGNOSIS — Z79899 Other long term (current) drug therapy: Secondary | ICD-10-CM | POA: Diagnosis not present

## 2021-07-20 DIAGNOSIS — Z510 Encounter for antineoplastic radiation therapy: Secondary | ICD-10-CM | POA: Diagnosis not present

## 2021-07-21 ENCOUNTER — Other Ambulatory Visit: Payer: Self-pay

## 2021-07-21 ENCOUNTER — Ambulatory Visit
Admission: RE | Admit: 2021-07-21 | Discharge: 2021-07-21 | Disposition: A | Discharge status: Home / Self Care | Payer: Medicare PPO | Source: Ambulatory Visit | Attending: Radiation Oncology | Admitting: Radiation Oncology

## 2021-07-21 ENCOUNTER — Encounter: Payer: Self-pay | Admitting: Radiation Oncology

## 2021-07-21 ENCOUNTER — Ambulatory Visit: Payer: Medicare PPO

## 2021-07-21 DIAGNOSIS — Z79899 Other long term (current) drug therapy: Secondary | ICD-10-CM | POA: Diagnosis not present

## 2021-07-21 DIAGNOSIS — I10 Essential (primary) hypertension: Secondary | ICD-10-CM | POA: Diagnosis not present

## 2021-07-21 DIAGNOSIS — E039 Hypothyroidism, unspecified: Secondary | ICD-10-CM | POA: Diagnosis not present

## 2021-07-21 DIAGNOSIS — C50411 Malignant neoplasm of upper-outer quadrant of right female breast: Secondary | ICD-10-CM | POA: Diagnosis not present

## 2021-07-21 DIAGNOSIS — Z170 Estrogen receptor positive status [ER+]: Secondary | ICD-10-CM | POA: Diagnosis not present

## 2021-07-21 DIAGNOSIS — Z510 Encounter for antineoplastic radiation therapy: Secondary | ICD-10-CM | POA: Diagnosis not present

## 2021-07-24 ENCOUNTER — Ambulatory Visit: Payer: Medicare PPO

## 2021-07-31 DIAGNOSIS — Z8719 Personal history of other diseases of the digestive system: Secondary | ICD-10-CM | POA: Diagnosis not present

## 2021-08-15 ENCOUNTER — Encounter: Payer: Self-pay | Admitting: Radiation Oncology

## 2021-08-19 NOTE — Progress Notes (Incomplete)
Radiation Oncology         (336) [phone] ________________________________  Patient Name: Rebecca [name] MRN: [id number] DOB: 1941-04-19 Referring Physician: Dione Housekeeper (Profile Not Attached) Date of Service: 07/21/2021 [facility] [place]                                                        End Of Treatment Note  Diagnoses: C50.411-Malignant neoplasm of upper-outer quadrant of right female breast  Cancer Staging: Stage IA (cT1c, cN0, cM0) Right Breast UOQ, Invasive Ductal Carcinoma and Intermediate grade DCIS,  ER+ / PR+ / Her2-, Grade 2 (pT2, pN0)  Intent: Curative  Radiation Treatment Dates: 06/29/2021 through 07/21/2021 Site Technique Total Dose (Gy) Dose per Fx (Gy) Completed Fx Beam Energies  Breast, Right: Breast_Rt 3D 42.72/42.72 2.67 16/16 6X   Narrative: The patient tolerated radiation therapy relatively well. She reports moderate fatigue and denies any issues with her skin.   On physical examination, the right breast area shows minimal erythema and hyperpigmentation changes.  Slight radiation dermatitis is noted in the upper inner aspect of the breast.   Plan: The patient will follow-up with radiation oncology in one month .  ________________________________________________ -----------------------------------  Blair Promise, PhD, MD  This document serves as a record of services personally performed by Gery Pray, MD. It was created on his behalf by Roney Mans, a trained medical scribe. The creation of this record is based on the scribe's personal observations and the provider's statements to them. This document has been checked and approved by the attending provider.

## 2021-08-20 NOTE — Progress Notes (Signed)
Radiation Oncology         (336) [phone] ________________________________  Name: Rebecca [name] MRN: [id number]  Date: 08/21/2021  DOB: 1941-06-07  Follow-Up Visit Note  CC: Ginger Organ., MD  Ginger Organ., MD    ICD-10-CM   1. Malignant neoplasm of upper-outer quadrant of right breast in female, estrogen receptor positive ([place])  C50.411    Z17.0       Diagnosis:  Stage IA (cT1c, cN0, cM0) Right Breast UOQ, Invasive Ductal Carcinoma and Intermediate grade DCIS,  ER+ / PR+ / Her2-, Grade 2 (pT2, pN0)  Interval Since Last Radiation: 1 month  Intent: Curative  Radiation Treatment Dates: 06/29/2021 through 07/21/2021 Site Technique Total Dose (Gy) Dose per Fx (Gy) Completed Fx Beam Energies  Breast, Right: Breast_Rt 3D 42.72/42.72 2.67 16/16 6X    Narrative:  The patient returns today for routine follow-up.  The patient tolerated radiation therapy relatively well. She reported moderate fatigue and denied any issues with her skin. Physical examination performed on the date of her final treatment revealed the right breast area to show minimal erythema and hyperpigmentation changes. Slight radiation dermatitis was noted in the upper inner aspect of the breast.   Since she was last seen for re-evaluation on 06/15/21, the patient followed up with Dr. Doris Cheadle on 07/04/21 to discuss antiestrogen therapy. Following discussion of the risks and benefits, the patient agreed to begin antiestrogen treatment consisting of tamoxifen on 08/13/21. The patient will follow up again with Dr. Jana Hakim later this month to assess her tolerance. If she tolerates it well, the plan will be to continue on tamoxifen for 5 years.   She has started tamoxifen and thus far has not had any side effects.  She denies any residual fatigue, itching or discomfort within the right breast.                              Allergies:  has No Known Allergies.  Meds: Current Outpatient Medications  Medication Sig  Dispense Refill   B Complex-C (SUPER B COMPLEX PO) Take 1 tablet by mouth daily.     cholecalciferol (VITAMIN D3) 25 MCG (1000 UT) tablet Take 1,000 Units by mouth daily.     levothyroxine (SYNTHROID) 75 MCG tablet Take 75 mcg by mouth daily before breakfast.      olmesartan (BENICAR) 20 MG tablet Take 20 mg by mouth daily.     omeprazole (PRILOSEC) 20 MG capsule Take 20 mg by mouth daily as needed (acid reflux).     POLY-IRON 150 150 MG capsule Take 150 mg by mouth daily.      No current facility-administered medications for this encounter.    Physical Findings: The patient is in no acute distress. Patient is alert and oriented.  height is _0  (1.676 m) and weight is 136 lb 2 oz (61.7 kg). Her blood pressure is 117/52 (abnormal) and her pulse is 68. Her respiration is 18 and oxygen saturation is 99%. .  No significant changes. Lungs are clear to auscultation bilaterally. Heart has regular rate and rhythm. No palpable cervical, supraclavicular, or axillary adenopathy. Abdomen soft, non-tender, normal bowel sounds.  Left breast: no palpable mass, nipple discharge or bleeding.  Right breast shows skin is healed well.  Minimal hyperpigmentation changes at this time.  No dominant mass appreciated in the breast nipple discharge or bleeding.    Lab Findings: Lab Results  Component Value Date  WBC 5.4 04/19/2021   HGB 12.6 04/19/2021   HCT 38.8 04/19/2021   MCV 93.9 04/19/2021   PLT 284 04/19/2021    Radiographic Findings: No results found.  Impression:  Stage IA (cT1c, cN0, cM0) Right Breast UOQ, Invasive Ductal Carcinoma and Intermediate grade DCIS,  ER+ / PR+ / Her2-, Grade 2 (pT2, pN0)  She has recovered well from her adjuvant radiation therapy and actually tolerated this treatment quite well considering her age.  No evidence of recurrence on clinical exam today  Plan: As needed follow-up in radiation oncology.  The patient will continue close follow-up in medical oncology and  continue on tamoxifen.    ____________________________________  Blair Promise, PhD, MD  This document serves as a record of services personally performed by Gery Pray, MD. It was created on his behalf by Roney Mans, a trained medical scribe. The creation of this record is based on the scribe's personal observations and the provider's statements to them. This document has been checked and approved by the attending provider.

## 2021-08-21 ENCOUNTER — Ambulatory Visit
Admission: RE | Admit: 2021-08-21 | Discharge: 2021-08-21 | Disposition: A | Discharge status: Home / Self Care | Payer: Medicare PPO | Source: Ambulatory Visit | Attending: Radiation Oncology | Admitting: Radiation Oncology

## 2021-08-21 ENCOUNTER — Other Ambulatory Visit: Payer: Self-pay

## 2021-08-21 ENCOUNTER — Encounter: Payer: Self-pay | Admitting: Radiation Oncology

## 2021-08-21 DIAGNOSIS — Z923 Personal history of irradiation: Secondary | ICD-10-CM | POA: Insufficient documentation

## 2021-08-21 DIAGNOSIS — Z170 Estrogen receptor positive status [ER+]: Secondary | ICD-10-CM | POA: Diagnosis not present

## 2021-08-21 DIAGNOSIS — C50411 Malignant neoplasm of upper-outer quadrant of right female breast: Secondary | ICD-10-CM | POA: Diagnosis not present

## 2021-08-21 DIAGNOSIS — Z79899 Other long term (current) drug therapy: Secondary | ICD-10-CM | POA: Insufficient documentation

## 2021-08-21 DIAGNOSIS — Z79810 Long term (current) use of selective estrogen receptor modulators (SERMs): Secondary | ICD-10-CM | POA: Insufficient documentation

## 2021-08-21 DIAGNOSIS — L598 Other specified disorders of the skin and subcutaneous tissue related to radiation: Secondary | ICD-10-CM | POA: Diagnosis not present

## 2021-08-21 HISTORY — DX: Personal history of irradiation: Z92.3

## 2021-08-21 NOTE — Progress Notes (Signed)
Rebecca [name] is here today for follow up post radiation to the breast.   Breast Side: Right.    They completed their radiation on:  07/21/21  Does the patient complain of any of the following: Post radiation skin issues: No Breast Tenderness: no Breast Swelling: no Lymphadema: no Range of Motion limitations: no Fatigue post radiation: Reports having a good energy level.  Appetite good/fair/poor: good  Additional comments if applicable:   Vitals:   08/21/21 1450  BP: (!) 117/52  Pulse: 68  Resp: 18  TempSrc: Temporal  SpO2: 99%  Weight: 136 lb 2 oz (61.7 kg)  Height: 5\' 6"  (1.676 m)

## 2021-09-07 ENCOUNTER — Telehealth: Payer: Self-pay | Admitting: *Deleted

## 2021-09-08 ENCOUNTER — Telehealth: Payer: Self-pay | Admitting: *Deleted

## 2021-09-19 DIAGNOSIS — L570 Actinic keratosis: Secondary | ICD-10-CM | POA: Diagnosis not present

## 2021-09-19 DIAGNOSIS — L820 Inflamed seborrheic keratosis: Secondary | ICD-10-CM | POA: Diagnosis not present

## 2021-09-25 NOTE — Progress Notes (Signed)
Patient Care Team: Ginger Organ., MD as PCP - General (Internal Medicine) Mauro Kaufmann, RN as Oncology Nurse Navigator Rockwell Germany, RN as Oncology Nurse Navigator Stark Klein, MD as Consulting Physician (General Surgery) Magrinat, Virgie Dad, MD (Inactive) as Consulting Physician (Oncology) Gery Pray, MD as Consulting Physician (Radiation Oncology)  DIAGNOSIS:    ICD-10-CM   1. Malignant neoplasm of central portion of left breast in female, estrogen receptor positive ([place])  C50.112    Z17.0       SUMMARY OF ONCOLOGIC HISTORY: Oncology History  Malignant neoplasm of central portion of left breast in female, estrogen receptor positive ([place])  06/12/2006 Initial Diagnosis   left breast upper outer quadrant biopsy 06/12/2006 for invasive ductal carcinoma, grade 1   07/08/2016 Surgery   Left lumpectomy with no sentinel lymph node sampling: pT1c pNX, stage IA i IDC grade 1, ER/PR positive, with an MIB-1 of 3%.    Radiation Therapy   Adjuvant radiation    Anti-estrogen oral therapy   Letrozole 2.5 mg daily   03/23/2021 Initial Diagnosis   right breast upper outer quadrant 03/23/2021 for a clinical T2 N0, stage IA invasive ductal carcinoma, grade 1 or 2, ER/PR strongly positive, MIB-1 1%, with no amplification of HER2.   04/27/2021 Cancer Staging   Staging form: Breast, AJCC 8th Edition - Clinical: Stage IA (cT1c, cN0, cM0, G1, ER+, PR+, HER2-) - Signed by Chauncey Cruel, MD on 04/27/2021 Histologic grading system: 3 grade system    08/13/2021 -  Anti-estrogen oral therapy   Adjuvant tamoxifen   Malignant neoplasm of upper-outer quadrant of right breast in female, estrogen receptor positive ([facility])   Radiation Therapy   Adjuvant radiation    Anti-estrogen oral therapy   Letrozole 2.5 mg daily   03/23/2021 Initial Diagnosis   right breast upper outer quadrant 03/23/2021 for a clinical T2 N0, stage IA invasive ductal carcinoma, grade 1 or 2, ER/PR strongly  positive, MIB-1 1%, with no amplification of HER2.   04/27/2021 Cancer Staging   Staging form: Breast, AJCC 8th Edition - Clinical: Stage IA (cT1c, cN0, cM0, G1, ER+, PR+, HER2-) - Signed by Chauncey Cruel, MD on 04/27/2021 Histologic grading system: 3 grade system    04/28/2021 Surgery   right lumpectomy and sentinel lymph node sampling for a pT2 pN0, stage IB invasive ductal carcinoma, grade 2, with a positive margin (resection of the margins negative)  total of 5 right axillary lymph nodes were negative   06/2021 - 07/21/2021 Radiation Therapy   Adjuvant radiation   08/13/2021 -  Anti-estrogen oral therapy   Adjuvant tamoxifen     CHIEF COMPLIANT: Follow-up of estrogen receptor positive breast cancer, to establish oncology care  INTERVAL HISTORY: Rebecca [name] is a 81 y.o. with above-mentioned history of estrogen receptor positive breast cancer having undergone right lumpectomy and radiation therapy. She presents to the clinic today for follow-up.  She had a prior history of left breast cancer in 2007 treated by Dr. Truddie Coco.  In September she had right-sided breast cancer and underwent another lumpectomy radiation was started on tamoxifen in January.  She is tolerating tamoxifen fairly well.  Initially she got very anxious about the side effects of tamoxifen and overall she does not think it has been too bad at all.  ALLERGIES:  has No Known Allergies.  MEDICATIONS:  Current Outpatient Medications  Medication Sig Dispense Refill   B Complex-C (SUPER B COMPLEX PO) Take 1 tablet by mouth daily.  cholecalciferol (VITAMIN D3) 25 MCG (1000 UT) tablet Take 1,000 Units by mouth daily.     levothyroxine (SYNTHROID) 75 MCG tablet Take 75 mcg by mouth daily before breakfast.      olmesartan (BENICAR) 20 MG tablet Take 20 mg by mouth daily.     omeprazole (PRILOSEC) 20 MG capsule Take 20 mg by mouth daily as needed (acid reflux).     POLY-IRON 150 150 MG capsule Take 150 mg by mouth daily.       No current facility-administered medications for this visit.    PHYSICAL EXAMINATION: ECOG PERFORMANCE STATUS: 1 - Symptomatic but completely ambulatory  Vitals:   09/26/21 1504  BP: (!) 122/59  Pulse: 73  Resp: 17  Temp: 98.7 F (37.1 C)  SpO2: 100%   Filed Weights   09/26/21 1504  Weight: 136 lb (61.7 kg)      LABORATORY DATA:  I have reviewed the data as listed CMP Latest Ref Rng & Units 04/19/2021 03/07/2019 07/10/2011  Glucose 70 - 99 mg/dL 98 105(H) 88  BUN 8 - 23 mg/dL 30(H) 18 22  Creatinine 0.44 - 1.00 mg/dL 0.78 0.56 0.64  Sodium 135 - 145 mmol/L 139 139 141  Potassium 3.5 - 5.1 mmol/L 4.0 3.6 4.9  Chloride 98 - 111 mmol/L 107 111 105  CO2 22 - 32 mmol/L '26 23 27  ' Calcium 8.9 - 10.3 mg/dL 9.2 8.1(L) 9.5  Total Protein 6.0 - 8.3 g/dL - - 6.5  Total Bilirubin 0.3 - 1.2 mg/dL - - 0.3  Alkaline Phos 39 - 117 U/L - - 79  AST 0 - 37 U/L - - 35  ALT 0 - 35 U/L - - 30    Lab Results  Component Value Date   WBC 5.4 04/19/2021   HGB 12.6 04/19/2021   HCT 38.8 04/19/2021   MCV 93.9 04/19/2021   PLT 284 04/19/2021   NEUTROABS 4.3 07/10/2011    ASSESSMENT & PLAN:  Malignant neoplasm of central portion of left breast in female, estrogen receptor positive ([place]) 06/12/2006: Left breast cancer status post left lumpectomy T1CNX stage Ia grade 1 ER/PR positive HER2 negative Ki-67 3%, radiation, letrozole 03/23/2021: Right breast cancer T2N0 stage Ia grade 1 IDC ER/PR positive HER2 negative Ki-67 1% status post right lumpectomy stage Ib grade 2 IDC 0/5 lymph nodes, radiation, tamoxifen  Current treatment: Tamoxifen started 08/13/2021 Tamoxifen toxicities: Mild hot flashes  Her husband passed away 2021/08/20: She was right at his bedside when he passed and she was very much at peace and contentment.  Hospice helped her tremendously.  Breast cancer surveillance: 1.  Breast exam 09/26/2021: Benign 2. mammograms annually Genetic testing was done in September.  We  do not have a results from that test in the computer.  Return to clinic in 3 months for survivorship Visit    No orders of the defined types were placed in this encounter.  The patient has a good understanding of the overall plan. she agrees with it. she will call with any problems that may develop before the next visit here.  Total time spent: 30 mins including face to face time and time spent for planning, charting and coordination of care  Rulon Eisenmenger, MD, MPH 09/26/2021  I, Thana Ates, am acting as scribe for Dr. Nicholas Lose.  I have reviewed the above documentation for accuracy and completeness, and I agree with the above.

## 2021-09-26 ENCOUNTER — Inpatient Hospital Stay: Payer: Medicare PPO | Admitting: *Deleted

## 2021-09-26 ENCOUNTER — Other Ambulatory Visit: Payer: Self-pay

## 2021-09-26 ENCOUNTER — Inpatient Hospital Stay: Payer: Medicare PPO | Attending: Hematology and Oncology | Admitting: Hematology and Oncology

## 2021-09-26 DIAGNOSIS — C50112 Malignant neoplasm of central portion of left female breast: Secondary | ICD-10-CM | POA: Insufficient documentation

## 2021-09-26 DIAGNOSIS — C50411 Malignant neoplasm of upper-outer quadrant of right female breast: Secondary | ICD-10-CM | POA: Diagnosis not present

## 2021-09-26 DIAGNOSIS — Z79810 Long term (current) use of selective estrogen receptor modulators (SERMs): Secondary | ICD-10-CM | POA: Diagnosis not present

## 2021-09-26 DIAGNOSIS — Z923 Personal history of irradiation: Secondary | ICD-10-CM | POA: Insufficient documentation

## 2021-09-26 DIAGNOSIS — Z170 Estrogen receptor positive status [ER+]: Secondary | ICD-10-CM | POA: Insufficient documentation

## 2021-09-26 NOTE — Assessment & Plan Note (Signed)
06/12/2006: Left breast cancer status post left lumpectomy T1CNX stage Ia grade 1 ER/PR positive HER2 negative Ki-67 3%, radiation, letrozole 03/23/2021: Right breast cancer T2N0 stage Ia grade 1 IDC ER/PR positive HER2 negative Ki-67 1% status post right lumpectomy stage Ib grade 2 IDC 0/5 lymph nodes, radiation, tamoxifen  Current treatment: Tamoxifen started 08/13/2021 Tamoxifen toxicities:  Breast cancer surveillance: 1.  Breast exam 09/26/2021: Benign 2. mammograms annually Referred to genetics  Return to clinic in 1 year for follow-up

## 2021-09-27 ENCOUNTER — Other Ambulatory Visit: Payer: Self-pay | Admitting: *Deleted

## 2021-09-27 DIAGNOSIS — Z170 Estrogen receptor positive status [ER+]: Secondary | ICD-10-CM

## 2021-09-27 NOTE — Progress Notes (Signed)
Per MD request, referral placed for genetic counseling to review previous genetic testing results. RN also placed scheduling message to schedule pt.

## 2021-09-28 ENCOUNTER — Inpatient Hospital Stay: Payer: Medicare PPO | Admitting: *Deleted

## 2021-10-27 ENCOUNTER — Telehealth: Payer: Self-pay | Admitting: Adult Health

## 2021-10-27 NOTE — Telephone Encounter (Signed)
.  Called patient to schedule appointment per 3/17 pt req, patient is aware of date and time.   ?

## 2021-10-30 ENCOUNTER — Inpatient Hospital Stay: Payer: Medicare PPO | Admitting: Adult Health

## 2021-10-30 ENCOUNTER — Inpatient Hospital Stay: Payer: Medicare PPO | Admitting: Licensed Clinical Social Worker

## 2021-11-23 ENCOUNTER — Telehealth: Payer: Self-pay | Admitting: *Deleted

## 2021-11-27 DIAGNOSIS — Z1379 Encounter for other screening for genetic and chromosomal anomalies: Secondary | ICD-10-CM | POA: Insufficient documentation

## 2021-11-27 NOTE — Progress Notes (Signed)
REFERRING PROVIDER: ?Nicholas Lose, MD ?[address] ?[place],  [place] [address] ? ?PRIMARY PROVIDER:  ?Ginger Organ., MD ? ?PRIMARY REASON FOR VISIT:  ?Encounter Diagnosis  ?Name Primary?  ? Genetic testing   ? ?HISTORY OF PRESENT ILLNESS:   ?Rebecca [name], a 81 y.o. female, was seen for a [facility] cancer genetics consultation at the request of Dr. Lindi Adie to discuss her genetic test results. ? ?Rebecca [name] was diagnosed with left breast cancer in 2007 and right breast cancer in 2022.  ? ?CANCER HISTORY:  ?Oncology History  ?Malignant neoplasm of central portion of left breast in female, estrogen receptor positive ([place])  ?06/12/2006 Initial Diagnosis  ? left breast upper outer quadrant biopsy 06/12/2006 for invasive ductal carcinoma, grade 1 ?  ?07/08/2016 Surgery  ? Left lumpectomy with no sentinel lymph node sampling: pT1c pNX, stage IA i IDC grade 1, ER/PR positive, with an MIB-1 of 3%. ?  ? Radiation Therapy  ? Adjuvant radiation ?  ? Anti-estrogen oral therapy  ? Letrozole 2.5 mg daily ?  ?03/23/2021 Initial Diagnosis  ? right breast upper outer quadrant 03/23/2021 for a clinical T2 N0, stage IA invasive ductal carcinoma, grade 1 or 2, ER/PR strongly positive, MIB-1 1%, with no amplification of HER2. ?  ?04/27/2021 Cancer Staging  ? Staging form: Breast, AJCC 8th Edition ?- Clinical: Stage IA (cT1c, cN0, cM0, G1, ER+, PR+, HER2-) - Signed by Chauncey Cruel, MD on 04/27/2021 ?Histologic grading system: 3 grade system ? ?  ?08/13/2021 -  Anti-estrogen oral therapy  ? Adjuvant tamoxifen ?  ?Malignant neoplasm of upper-outer quadrant of right breast in female, estrogen receptor positive ([place])  ? Radiation Therapy  ? Adjuvant radiation ?  ? Anti-estrogen oral therapy  ? Letrozole 2.5 mg daily ?  ?03/23/2021 Initial Diagnosis  ? right breast upper outer quadrant 03/23/2021 for a clinical T2 N0, stage IA invasive ductal carcinoma, grade 1 or 2, ER/PR strongly positive, MIB-1 1%, with no amplification of  HER2. ?  ?04/27/2021 Cancer Staging  ? Staging form: Breast, AJCC 8th Edition ?- Clinical: Stage IA (cT1c, cN0, cM0, G1, ER+, PR+, HER2-) - Signed by Chauncey Cruel, MD on 04/27/2021 ?Histologic grading system: 3 grade system ? ?  ?04/28/2021 Surgery  ? right lumpectomy and sentinel lymph node sampling for a pT2 pN0, stage IB invasive ductal carcinoma, grade 2, with a positive margin (resection of the margins negative)  total of 5 right axillary lymph nodes were negative ?  ?06/2021 - 07/21/2021 Radiation Therapy  ? Adjuvant radiation ?  ?08/13/2021 -  Anti-estrogen oral therapy  ? Adjuvant tamoxifen ?  ? ? ? ?Past Medical History:  ?Diagnosis Date  ? Anemia   ? Breast CA ([place])   ? Left  ? GERD (gastroesophageal reflux disease)   ? History of cataract   ? History of hiatal hernia   ? History of radiation therapy   ? Right breast- 06/29/21-07/21/21- Dr. Gery Pray  ? Hypertension 2022  ? Hypothyroidism   ? OA (osteoarthritis)   ? Personal history of radiation therapy   ? PONV (postoperative nausea and vomiting)   ? Vitamin D deficiency   ? ? ?Past Surgical History:  ?Procedure Laterality Date  ? BREAST EXCISIONAL BIOPSY Left   ? BREAST LUMPECTOMY Left   ? BREAST LUMPECTOMY WITH RADIOACTIVE SEED AND SENTINEL LYMPH NODE BIOPSY Right 04/28/2021  ? Procedure: RIGHT BREAST LUMPECTOMY WITH RADIOACTIVE SEED x 2 AND SENTINEL LYMPH NODE BIOPSY;  Surgeon: Barry Dienes,  Dorris Fetch, MD;  Location: [facility];  Service: General;  Laterality: Right;  ? CATARACT EXTRACTION, BILATERAL    ? COLONOSCOPY  01/03/2007  ? RE-EXCISION OF BREAST LUMPECTOMY Right 05/15/2021  ? Procedure: RE-EXCISION OF RIGHT BREAST LUMPECTOMY;  Surgeon: Stark Klein, MD;  Location: [facility];  Service: General;  Laterality: Right;  ? TOTAL HIP ARTHROPLASTY Left 03/06/2019  ? Procedure: LEFT TOTAL HIP ARTHROPLASTY ANTERIOR APPROACH;  Surgeon: Mcarthur Rossetti, MD;  [facility];  Service: Orthopedics;  Laterality: Left;  ? UPPER GI ENDOSCOPY   09/23/2007  ? ? ?Social History  ? ?Socioeconomic History  ? Marital status: Married  ?  Spouse name: Not on file  ? Number of children: Not on file  ? Years of education: Not on file  ? Highest education level: Not on file  ?Occupational History  ? Not on file  ?Tobacco Use  ? Smoking status: Never  ? Smokeless tobacco: Never  ?Vaping Use  ? Vaping Use: Never used  ?Substance and Sexual Activity  ? Alcohol use: Yes  ?  Comment: maybe once a month  ? Drug use: Never  ? Sexual activity: Not on file  ?Other Topics Concern  ? Not on file  ?Social History Narrative  ? Not on file  ? ?Social Determinants of Health  ? ?Financial Resource Strain: Not on file  ?Food Insecurity: Not on file  ?Transportation Needs: Not on file  ?Physical Activity: Not on file  ?Stress: Not on file  ?Social Connections: Not on file  ?  ? ?FAMILY HISTORY:  ?We obtained a detailed, 4-generation family history.  Significant diagnoses are listed below: ?Family History  ?Problem Relation Age of Onset  ? Kidney cancer Father   ?     dx. 5s  ? Breast cancer Sister   ?     dx. 41s  ? Kidney cancer Sister 74  ? Bladder Cancer Maternal Aunt   ? ? ? ? ?Rebecca [name] sister was diagnosed with breast cancer in her 6s and a second sister was diagnosed with kidney cancer at age 68, this sister is deceased. Rebecca [name] father was diagnosed with kidney cancer in his 71s and died at age 5. Rebecca [name] is unaware of previous family history of genetic testing for hereditary cancer risks.  ? ?GENETIC COUNSELING DISCUSSION: ?During the genetic counseling session, we reviewed Rebecca [name]'s genetic testing results. ? 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 The remaining 83 genes analyzed were negative.  ? ?This specific variant [phone]) has been found to be associated with autosomal recessive fumarate hydratase (FH) deficiency, but not  autosomal dominant hereditary leiomyomatosis and renal cell cancer (HLRCC) at this time. ?  ?The test report has been scanned into EPIC and is located under the Molecular Pathology section of the Results Review tab.  A portion of the result report is included below for reference. Genetic testing reported out on 05/17/2021.  ? ? ? ?FH Gene: ?The FH gene is associated with Hereditary Leiomyomatosis and Renal Cell Cancer (HLRCC). HLRCC is a rare, adult-onset tumor-predisposition syndrome that is characterized by cutaneous leiomyomas, uterine leiomyomas, and renal tumors. Additionally, the FH gene has preliminary evidence supporting a correlation with hereditary paraganglioma-pheochromocytoma. However, Rebecca [name] particular likely pathogenic variant in the FH gene ([id number]) is not expected to confer risk for autosomal dominant HLRCC. 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 This result does not impact her screening recommendations at this time.  ?  ?Implications for Family Members: ?Pathogenic variants in the FH gene that are associated with HLRCC have autosomal dominant inheritance. This means that an individual with a pathogenic variant has a 50% chance of passing the condition on to their offspring. However, Rebecca [name]'s particular pathogenic variant in the FH gene is only known to be associated with being a carrier of autosomal recessive fumarate hydratase (FH) deficiency. FH deficiency is an inborn error of metabolism that is characterized by rapidly progressive neurologic impairment, including hypotonia, seizures, and cerebral atrophy. Most survive only for a few months after birth. For there to be a risk to offspring, both the patient and their partner would have to have a single pathogenic variant in the [place] gene; in such a case, the risk to offspring is 25%.  ? ?Rebecca [name] first degree relatives are at 50% risk for also having inherited the pathogenic  variant in [place] and being carriers of FH deficiency. Therefore, single site testing may be considered for all at risk relatives. They may contact our office at [phone] for more information or to schedule

## 2021-11-28 ENCOUNTER — Other Ambulatory Visit: Payer: Self-pay

## 2021-11-28 ENCOUNTER — Encounter: Payer: Self-pay | Admitting: Genetic Counselor

## 2021-11-28 ENCOUNTER — Encounter: Payer: Self-pay | Admitting: Adult Health

## 2021-11-28 ENCOUNTER — Inpatient Hospital Stay (HOSPITAL_BASED_OUTPATIENT_CLINIC_OR_DEPARTMENT_OTHER): Payer: Medicare PPO | Admitting: Genetic Counselor

## 2021-11-28 ENCOUNTER — Inpatient Hospital Stay: Payer: Medicare PPO | Attending: Adult Health | Admitting: Adult Health

## 2021-11-28 ENCOUNTER — Inpatient Hospital Stay: Payer: Medicare PPO

## 2021-11-28 VITALS — BP 130/52 | HR 68 | Temp 97.5°F | Resp 18 | Ht 66.0 in | Wt 133.7 lb

## 2021-11-28 DIAGNOSIS — Z803 Family history of malignant neoplasm of breast: Secondary | ICD-10-CM | POA: Insufficient documentation

## 2021-11-28 DIAGNOSIS — Z170 Estrogen receptor positive status [ER+]: Secondary | ICD-10-CM | POA: Diagnosis not present

## 2021-11-28 DIAGNOSIS — Z79810 Long term (current) use of selective estrogen receptor modulators (SERMs): Secondary | ICD-10-CM | POA: Insufficient documentation

## 2021-11-28 DIAGNOSIS — Z1379 Encounter for other screening for genetic and chromosomal anomalies: Secondary | ICD-10-CM | POA: Diagnosis not present

## 2021-11-28 DIAGNOSIS — Z923 Personal history of irradiation: Secondary | ICD-10-CM | POA: Insufficient documentation

## 2021-11-28 DIAGNOSIS — Z853 Personal history of malignant neoplasm of breast: Secondary | ICD-10-CM

## 2021-11-28 DIAGNOSIS — L578 Other skin changes due to chronic exposure to nonionizing radiation: Secondary | ICD-10-CM | POA: Diagnosis not present

## 2021-11-28 DIAGNOSIS — C50112 Malignant neoplasm of central portion of left female breast: Secondary | ICD-10-CM | POA: Diagnosis not present

## 2021-11-28 DIAGNOSIS — Z8051 Family history of malignant neoplasm of kidney: Secondary | ICD-10-CM | POA: Diagnosis not present

## 2021-11-28 DIAGNOSIS — C50411 Malignant neoplasm of upper-outer quadrant of right female breast: Secondary | ICD-10-CM | POA: Diagnosis not present

## 2021-11-28 NOTE — Progress Notes (Signed)
SURVIVORSHIP VISIT: ? ? ? ?BRIEF ONCOLOGIC HISTORY:  ?Oncology History  ?Malignant neoplasm of central portion of left breast in female, estrogen receptor positive ([place])  ?06/12/2006 Initial Diagnosis  ? left breast upper outer quadrant biopsy 06/12/2006 for invasive ductal carcinoma, grade 1 ?  ?07/08/2016 Surgery  ? Left lumpectomy with no sentinel lymph node sampling: pT1c pNX, stage IA i IDC grade 1, ER/PR positive, with an MIB-1 of 3%. ?  ? Radiation Therapy  ? Adjuvant radiation ?  ? Anti-estrogen oral therapy  ? Letrozole 2.5 mg daily ?  ?03/23/2021 Initial Diagnosis  ? right breast upper outer quadrant 03/23/2021 for a clinical T2 N0, stage IA invasive ductal carcinoma, grade 1 or 2, ER/PR strongly positive, MIB-1 1%, with no amplification of HER2. ?  ?04/27/2021 Cancer Staging  ? Staging form: Breast, AJCC 8th Edition ?- Clinical: Stage IA (cT1c, cN0, cM0, G1, ER+, PR+, HER2-) - Signed by Chauncey Cruel, MD on 04/27/2021 ?Histologic grading system: 3 grade system ? ?  ?08/13/2021 -  Anti-estrogen oral therapy  ? Adjuvant tamoxifen ?  ?Malignant neoplasm of upper-outer quadrant of right breast in female, estrogen receptor positive ([place])  ? Radiation Therapy  ? Adjuvant radiation ?  ? Anti-estrogen oral therapy  ? Letrozole 2.5 mg daily ?  ?03/23/2021 Initial Diagnosis  ? right breast upper outer quadrant 03/23/2021 for a clinical T2 N0, stage IA invasive ductal carcinoma, grade 1 or 2, ER/PR strongly positive, MIB-1 1%, with no amplification of HER2. ?  ?04/27/2021 Cancer Staging  ? Staging form: Breast, AJCC 8th Edition ?- Clinical: Stage IA (cT1c, cN0, cM0, G1, ER+, PR+, HER2-) - Signed by Chauncey Cruel, MD on 04/27/2021 ?Histologic grading system: 3 grade system ? ?  ?04/28/2021 Surgery  ? right lumpectomy and sentinel lymph node sampling for a pT2 pN0, stage IB invasive ductal carcinoma, grade 2, with a positive margin (resection of the margins negative)  total of 5 right axillary lymph nodes were  negative ?  ?06/2021 - 07/21/2021 Radiation Therapy  ? Site Technique Total Dose (Gy) Dose per Fx (Gy) Completed Fx Beam Energies  ?Breast, Right: Breast_Rt 3D 42.72/42.72 2.67 16/16 6X  ?  ? ?  ?08/13/2021 -  Anti-estrogen oral therapy  ? Adjuvant tamoxifen ?  ? ? ?INTERVAL HISTORY:  ?Rebecca [name] to review her survivorship care plan detailing her treatment course for breast cancer, as well as monitoring long-term side effects of that treatment, education regarding health maintenance, screening, and overall wellness and health promotion.    ? ?Overall, Rebecca [name] reports feeling quite well.  She tells me that she is taking tamoxifen daily and tolerates it quite well. ? ?REVIEW OF SYSTEMS:  ?Review of Systems  ?Constitutional:  Negative for appetite change, chills, fatigue, fever and unexpected weight change.  ?HENT:   Negative for hearing loss, lump/mass and trouble swallowing.   ?Eyes:  Negative for eye problems and icterus.  ?Respiratory:  Negative for chest tightness, cough and shortness of breath.   ?Cardiovascular:  Negative for chest pain, leg swelling and palpitations.  ?Gastrointestinal:  Negative for abdominal distention, abdominal pain, constipation, diarrhea, nausea and vomiting.  ?Endocrine: Negative for hot flashes.  ?Genitourinary:  Negative for difficulty urinating.   ?Musculoskeletal:  Negative for arthralgias.  ?Skin:  Negative for itching and rash.  ?Neurological:  Negative for dizziness, extremity weakness, headaches and numbness.  ?Hematological:  Negative for adenopathy. Does not bruise/bleed easily.  ?Psychiatric/Behavioral:  Negative for depression. The patient is not nervous/anxious.   ?  Breast: Denies any new nodularity, masses, tenderness, nipple changes, or nipple discharge.  ? ? ?ONCOLOGY TREATMENT TEAM:  ?1. Surgeon:  Dr. Barry Dienes at [facility] ?2. Medical Oncologist: Dr. Lindi Adie  ?3. Radiation Oncologist: Dr. Sondra Come ?  ? ?PAST MEDICAL/SURGICAL HISTORY:  ?Past Medical History:   ?Diagnosis Date  ? Anemia   ? Breast CA ([place])   ? Left  ? GERD (gastroesophageal reflux disease)   ? History of cataract   ? History of hiatal hernia   ? History of radiation therapy   ? Right breast- 06/29/21-07/21/21- Dr. Gery Pray  ? Hypertension 2022  ? Hypothyroidism   ? OA (osteoarthritis)   ? Personal history of radiation therapy   ? PONV (postoperative nausea and vomiting)   ? Vitamin D deficiency   ? ?Past Surgical History:  ?Procedure Laterality Date  ? BREAST EXCISIONAL BIOPSY Left   ? BREAST LUMPECTOMY Left   ? BREAST LUMPECTOMY WITH RADIOACTIVE SEED AND SENTINEL LYMPH NODE BIOPSY Right 04/28/2021  ? Procedure: RIGHT BREAST LUMPECTOMY WITH RADIOACTIVE SEED x 2 AND SENTINEL LYMPH NODE BIOPSY;  Surgeon: Stark Klein, MD;  Location: [facility];  Service: General;  Laterality: Right;  ? CATARACT EXTRACTION, BILATERAL    ? COLONOSCOPY  01/03/2007  ? RE-EXCISION OF BREAST LUMPECTOMY Right 05/15/2021  ? Procedure: RE-EXCISION OF RIGHT BREAST LUMPECTOMY;  Surgeon: Stark Klein, MD;  Location: [facility];  Service: General;  Laterality: Right;  ? TOTAL HIP ARTHROPLASTY Left 03/06/2019  ? Procedure: LEFT TOTAL HIP ARTHROPLASTY ANTERIOR APPROACH;  Surgeon: Mcarthur Rossetti, MD;  [facility];  Service: Orthopedics;  Laterality: Left;  ? UPPER GI ENDOSCOPY  09/23/2007  ? ? ? ?ALLERGIES:  ?No Known Allergies ? ? ?CURRENT MEDICATIONS:  ?Outpatient Encounter Medications as of 11/28/2021  ?Medication Sig  ? B Complex-C (SUPER B COMPLEX PO) Take 1 tablet by mouth daily.  ? cholecalciferol (VITAMIN D3) 25 MCG (1000 UT) tablet Take 1,000 Units by mouth daily.  ? levothyroxine (SYNTHROID) 75 MCG tablet Take 75 mcg by mouth daily before breakfast.   ? olmesartan (BENICAR) 20 MG tablet Take 20 mg by mouth daily.  ? omeprazole (PRILOSEC) 20 MG capsule Take 20 mg by mouth daily as needed (acid reflux).  ? POLY-IRON 150 150 MG capsule Take 150 mg by mouth daily.   ? tamoxifen (NOLVADEX) 20 MG tablet Take  by mouth.  ? ?No facility-administered encounter medications on file as of 11/28/2021.  ? ? ? ?ONCOLOGIC FAMILY HISTORY:  ?Family History  ?Problem Relation Age of Onset  ? Kidney cancer Father   ? Breast cancer Sister   ? Kidney cancer Sister   ? Bladder Cancer Maternal Aunt   ? Breast cancer Cousin   ?     mat first cousin  ? ? ?SOCIAL HISTORY:  ?Social History  ? ?Socioeconomic History  ? Marital status: Married  ?  Spouse name: Not on file  ? Number of children: Not on file  ? Years of education: Not on file  ? Highest education level: Not on file  ?Occupational History  ? Not on file  ?Tobacco Use  ? Smoking status: Never  ? Smokeless tobacco: Never  ?Vaping Use  ? Vaping Use: Never used  ?Substance and Sexual Activity  ? Alcohol use: Yes  ?  Comment: maybe once a month  ? Drug use: Never  ? Sexual activity: Not on file  ?Other Topics Concern  ? Not on file  ?Social History Narrative  ?  Not on file  ? ?Social Determinants of Health  ? ?Financial Resource Strain: Not on file  ?Food Insecurity: Not on file  ?Transportation Needs: Not on file  ?Physical Activity: Not on file  ?Stress: Not on file  ?Social Connections: Not on file  ?Intimate Partner Violence: Not on file  ? ? ? ?OBSERVATIONS/OBJECTIVE:  ?BP (!) 130/52 (BP Location: Left Arm, Patient Position: Sitting)   Pulse 68   Temp (!) 97.5 ?F (36.4 ?C) (Temporal)   Resp 18   Ht _0  (1.676 m)   Wt 133 lb 11.2 oz (60.6 kg)   SpO2 100%   BMI 21.58 kg/m?  ?Declined full PE, states is in a hurry ? ? ?LABORATORY DATA:  ?None for this visit. ? ?DIAGNOSTIC IMAGING:  ?None for this visit.  ? ?  ? ?ASSESSMENT AND PLAN:  ?RebeccaMarland Kitchen [name] is a pleasant 81 y.o. female with history of left sided stage IA in 2017 with recent Stage IA right breast invasive ductal carcinoma, ER+/PR+/HER2-, diagnosed initially with left breast cancer in 2017 and with right breast cancer in 2022, most recently treated with lumpectomy, adjuvant radiation therapy, and anti-estrogen therapy with  Tamoxifen beginning in 08/2021.  She presents to the Survivorship Clinic for our initial meeting and routine follow-up post-completion of treatment for breast cancer.  ? ? ?Bilateral breast cancer:  Ms. Dominik is continui

## 2021-11-29 ENCOUNTER — Telehealth: Payer: Self-pay | Admitting: Adult Health

## 2021-11-29 ENCOUNTER — Encounter (HOSPITAL_COMMUNITY): Payer: Self-pay

## 2021-11-29 NOTE — Telephone Encounter (Signed)
Left message with follow-up appointments per 4/18 los. ?

## 2022-01-18 DIAGNOSIS — Z85828 Personal history of other malignant neoplasm of skin: Secondary | ICD-10-CM | POA: Diagnosis not present

## 2022-01-18 DIAGNOSIS — C44329 Squamous cell carcinoma of skin of other parts of face: Secondary | ICD-10-CM | POA: Diagnosis not present

## 2022-01-23 DIAGNOSIS — G2581 Restless legs syndrome: Secondary | ICD-10-CM | POA: Diagnosis not present

## 2022-01-23 DIAGNOSIS — E785 Hyperlipidemia, unspecified: Secondary | ICD-10-CM | POA: Diagnosis not present

## 2022-01-23 DIAGNOSIS — M810 Age-related osteoporosis without current pathological fracture: Secondary | ICD-10-CM | POA: Diagnosis not present

## 2022-01-23 DIAGNOSIS — E039 Hypothyroidism, unspecified: Secondary | ICD-10-CM | POA: Diagnosis not present

## 2022-01-23 DIAGNOSIS — R7301 Impaired fasting glucose: Secondary | ICD-10-CM | POA: Diagnosis not present

## 2022-02-01 DIAGNOSIS — D649 Anemia, unspecified: Secondary | ICD-10-CM | POA: Diagnosis not present

## 2022-02-08 DIAGNOSIS — Z1339 Encounter for screening examination for other mental health and behavioral disorders: Secondary | ICD-10-CM | POA: Diagnosis not present

## 2022-02-08 DIAGNOSIS — R82998 Other abnormal findings in urine: Secondary | ICD-10-CM | POA: Diagnosis not present

## 2022-02-08 DIAGNOSIS — C50911 Malignant neoplasm of unspecified site of right female breast: Secondary | ICD-10-CM | POA: Diagnosis not present

## 2022-02-08 DIAGNOSIS — Z1331 Encounter for screening for depression: Secondary | ICD-10-CM | POA: Diagnosis not present

## 2022-02-08 DIAGNOSIS — E785 Hyperlipidemia, unspecified: Secondary | ICD-10-CM | POA: Diagnosis not present

## 2022-02-08 DIAGNOSIS — R030 Elevated blood-pressure reading, without diagnosis of hypertension: Secondary | ICD-10-CM | POA: Diagnosis not present

## 2022-02-08 DIAGNOSIS — Z0000 Encounter for general adult medical examination without abnormal findings: Secondary | ICD-10-CM | POA: Diagnosis not present

## 2022-02-08 DIAGNOSIS — R7301 Impaired fasting glucose: Secondary | ICD-10-CM | POA: Diagnosis not present

## 2022-02-08 DIAGNOSIS — E039 Hypothyroidism, unspecified: Secondary | ICD-10-CM | POA: Diagnosis not present

## 2022-02-08 DIAGNOSIS — M810 Age-related osteoporosis without current pathological fracture: Secondary | ICD-10-CM | POA: Diagnosis not present

## 2022-02-08 DIAGNOSIS — D649 Anemia, unspecified: Secondary | ICD-10-CM | POA: Diagnosis not present

## 2022-02-08 DIAGNOSIS — Z170 Estrogen receptor positive status [ER+]: Secondary | ICD-10-CM | POA: Diagnosis not present

## 2022-02-09 DIAGNOSIS — E039 Hypothyroidism, unspecified: Secondary | ICD-10-CM | POA: Diagnosis not present

## 2022-02-09 DIAGNOSIS — J029 Acute pharyngitis, unspecified: Secondary | ICD-10-CM | POA: Diagnosis not present

## 2022-02-09 DIAGNOSIS — Z20822 Contact with and (suspected) exposure to covid-19: Secondary | ICD-10-CM | POA: Diagnosis not present

## 2022-02-09 DIAGNOSIS — U071 COVID-19: Secondary | ICD-10-CM | POA: Diagnosis not present

## 2022-02-09 DIAGNOSIS — Z170 Estrogen receptor positive status [ER+]: Secondary | ICD-10-CM | POA: Diagnosis not present

## 2022-02-09 DIAGNOSIS — E785 Hyperlipidemia, unspecified: Secondary | ICD-10-CM | POA: Diagnosis not present

## 2022-02-09 DIAGNOSIS — C50911 Malignant neoplasm of unspecified site of right female breast: Secondary | ICD-10-CM | POA: Diagnosis not present

## 2022-03-28 ENCOUNTER — Ambulatory Visit
Admission: RE | Admit: 2022-03-28 | Discharge: 2022-03-28 | Disposition: A | Discharge status: Home / Self Care | Payer: Medicare PPO | Source: Ambulatory Visit | Attending: Adult Health | Admitting: Adult Health

## 2022-03-28 DIAGNOSIS — Z853 Personal history of malignant neoplasm of breast: Secondary | ICD-10-CM | POA: Diagnosis not present

## 2022-03-28 DIAGNOSIS — C50112 Malignant neoplasm of central portion of left female breast: Secondary | ICD-10-CM

## 2022-05-11 DIAGNOSIS — R319 Hematuria, unspecified: Secondary | ICD-10-CM | POA: Diagnosis not present

## 2022-05-11 DIAGNOSIS — N390 Urinary tract infection, site not specified: Secondary | ICD-10-CM | POA: Diagnosis not present

## 2022-05-15 DIAGNOSIS — R9389 Abnormal findings on diagnostic imaging of other specified body structures: Secondary | ICD-10-CM | POA: Diagnosis not present

## 2022-05-15 DIAGNOSIS — N3941 Urge incontinence: Secondary | ICD-10-CM | POA: Diagnosis not present

## 2022-05-15 DIAGNOSIS — N939 Abnormal uterine and vaginal bleeding, unspecified: Secondary | ICD-10-CM | POA: Diagnosis not present

## 2022-05-15 DIAGNOSIS — R300 Dysuria: Secondary | ICD-10-CM | POA: Diagnosis not present

## 2022-05-15 DIAGNOSIS — Z853 Personal history of malignant neoplasm of breast: Secondary | ICD-10-CM | POA: Diagnosis not present

## 2022-05-15 DIAGNOSIS — R319 Hematuria, unspecified: Secondary | ICD-10-CM | POA: Diagnosis not present

## 2022-05-16 DIAGNOSIS — Z79810 Long term (current) use of selective estrogen receptor modulators (SERMs): Secondary | ICD-10-CM | POA: Diagnosis not present

## 2022-05-16 DIAGNOSIS — Z853 Personal history of malignant neoplasm of breast: Secondary | ICD-10-CM | POA: Diagnosis not present

## 2022-05-16 DIAGNOSIS — N939 Abnormal uterine and vaginal bleeding, unspecified: Secondary | ICD-10-CM | POA: Diagnosis not present

## 2022-05-16 DIAGNOSIS — R9389 Abnormal findings on diagnostic imaging of other specified body structures: Secondary | ICD-10-CM | POA: Diagnosis not present

## 2022-05-28 NOTE — Progress Notes (Signed)
Patient Care Team: Ginger Organ., MD as PCP - General (Internal Medicine) Stark Klein, MD as Consulting Physician (General Surgery) Gery Pray, MD as Consulting Physician (Radiation Oncology) Nicholas Lose, MD as Consulting Physician (Hematology and Oncology)  DIAGNOSIS: No diagnosis found.  SUMMARY OF ONCOLOGIC HISTORY: Oncology History  Malignant neoplasm of central portion of left breast in female, estrogen receptor positive ([place])  06/12/2016 Initial Diagnosis   left breast upper outer quadrant biopsy 06/12/2016 for invasive ductal carcinoma, grade 1   07/08/2016 Surgery   Left lumpectomy with no sentinel lymph node sampling: pT1c pNX, stage IA i IDC grade 1, ER/PR positive, with an MIB-1 of 3%.    Radiation Therapy   Adjuvant radiation    Anti-estrogen oral therapy   Letrozole 2.5 mg daily   03/23/2021 Initial Diagnosis   right breast upper outer quadrant 03/23/2021 for a clinical T2 N0, stage IA invasive ductal carcinoma, grade 1 or 2, ER/PR strongly positive, MIB-1 1%, with no amplification of HER2.   04/27/2021 Cancer Staging   Staging form: Breast, AJCC 8th Edition - Clinical: Stage IA (cT1c, cN0, cM0, G1, ER+, PR+, HER2-) - Signed by Chauncey Cruel, MD on 04/27/2021 Histologic grading system: 3 grade system   08/13/2021 -  Anti-estrogen oral therapy   Adjuvant tamoxifen   Malignant neoplasm of upper-outer quadrant of right breast in female, estrogen receptor positive (HCC)   Radiation Therapy   Adjuvant radiation    Anti-estrogen oral therapy   Letrozole 2.5 mg daily   03/23/2021 Initial Diagnosis   right breast upper outer quadrant 03/23/2021 for a clinical T2 N0, stage IA invasive ductal carcinoma, grade 1 or 2, ER/PR strongly positive, MIB-1 1%, with no amplification of HER2.   04/27/2021 Cancer Staging   Staging form: Breast, AJCC 8th Edition - Clinical: Stage IA (cT1c, cN0, cM0, G1, ER+, PR+, HER2-) - Signed by Chauncey Cruel, MD on  04/27/2021 Histologic grading system: 3 grade system   04/28/2021 Surgery   right lumpectomy and sentinel lymph node sampling for a pT2 pN0, stage IB invasive ductal carcinoma, grade 2, with a positive margin (resection of the margins negative)  total of 5 right axillary lymph nodes were negative   06/2021 - 07/21/2021 Radiation Therapy   Site Technique Total Dose (Gy) Dose per Fx (Gy) Completed Fx Beam Energies  Breast, Right: Breast_Rt 3D 42.72/42.72 2.67 16/16 6X       08/13/2021 -  Anti-estrogen oral therapy   Adjuvant tamoxifen     CHIEF COMPLIANT: : Follow-up of estrogen receptor positive breast cancer  INTERVAL HISTORY: Rebecca [name] is a 81 y.o. with above-mentioned history of estrogen receptor positive breast cancer having undergone right lumpectomy and radiation therapy. She presents to the clinic today for follow-up.   ALLERGIES:  has No Known Allergies.  MEDICATIONS:  Current Outpatient Medications  Medication Sig Dispense Refill   B Complex-C (SUPER B COMPLEX PO) Take 1 tablet by mouth daily.     cholecalciferol (VITAMIN D3) 25 MCG (1000 UT) tablet Take 1,000 Units by mouth daily.     levothyroxine (SYNTHROID) 75 MCG tablet Take 75 mcg by mouth daily before breakfast.      olmesartan (BENICAR) 20 MG tablet Take 20 mg by mouth daily.     omeprazole (PRILOSEC) 20 MG capsule Take 20 mg by mouth daily as needed (acid reflux).     POLY-IRON 150 150 MG capsule Take 150 mg by mouth daily.      tamoxifen (NOLVADEX)  20 MG tablet Take by mouth.     No current facility-administered medications for this visit.    PHYSICAL EXAMINATION: ECOG PERFORMANCE STATUS: {CHL ONC ECOG PS:[phone]}  There were no vitals filed for this visit. There were no vitals filed for this visit.  BREAST:*** No palpable masses or nodules in either right or left breasts. No palpable axillary supraclavicular or infraclavicular adenopathy no breast tenderness or nipple discharge. (exam performed in the  presence of a chaperone)  LABORATORY DATA:  I have reviewed the data as listed    Latest Ref Rng & Units 04/19/2021   10:00 AM 03/07/2019    3:18 AM 07/10/2011    3:26 PM  CMP  Glucose 70 - 99 mg/dL 98  105  88   BUN 8 - 23 mg/dL '30  18  22   ' Creatinine 0.44 - 1.00 mg/dL 0.78  0.56  0.64   Sodium 135 - 145 mmol/L 139  139  141   Potassium 3.5 - 5.1 mmol/L 4.0  3.6  4.9   Chloride 98 - 111 mmol/L 107  111  105   CO2 22 - 32 mmol/L '26  23  27   ' Calcium 8.9 - 10.3 mg/dL 9.2  8.1  9.5   Total Protein 6.0 - 8.3 g/dL   6.5   Total Bilirubin 0.3 - 1.2 mg/dL   0.3   Alkaline Phos 39 - 117 U/L   79   AST 0 - 37 U/L   35   ALT 0 - 35 U/L   30     Lab Results  Component Value Date   WBC 5.4 04/19/2021   HGB 12.6 04/19/2021   HCT 38.8 04/19/2021   MCV 93.9 04/19/2021   PLT 284 04/19/2021   NEUTROABS 4.3 07/10/2011    ASSESSMENT & PLAN:  No problem-specific Assessment & Plan notes found for this encounter.    No orders of the defined types were placed in this encounter.  The patient has a good understanding of the overall plan. she agrees with it. she will call with any problems that may develop before the next visit here. Total time spent: 30 mins including face to face time and time spent for planning, charting and co-ordination of care   Suzzette Righter, [place] 05/28/22    I Gardiner Coins am scribing for Dr. Lindi Adie  ***

## 2022-05-30 ENCOUNTER — Inpatient Hospital Stay: Payer: Medicare PPO | Attending: Hematology and Oncology | Admitting: Hematology and Oncology

## 2022-05-30 ENCOUNTER — Other Ambulatory Visit: Payer: Self-pay

## 2022-05-30 DIAGNOSIS — Z803 Family history of malignant neoplasm of breast: Secondary | ICD-10-CM | POA: Diagnosis not present

## 2022-05-30 DIAGNOSIS — Z923 Personal history of irradiation: Secondary | ICD-10-CM | POA: Diagnosis not present

## 2022-05-30 DIAGNOSIS — C50411 Malignant neoplasm of upper-outer quadrant of right female breast: Secondary | ICD-10-CM | POA: Diagnosis not present

## 2022-05-30 DIAGNOSIS — C50112 Malignant neoplasm of central portion of left female breast: Secondary | ICD-10-CM | POA: Insufficient documentation

## 2022-05-30 DIAGNOSIS — Z79810 Long term (current) use of selective estrogen receptor modulators (SERMs): Secondary | ICD-10-CM | POA: Insufficient documentation

## 2022-05-30 DIAGNOSIS — R252 Cramp and spasm: Secondary | ICD-10-CM | POA: Insufficient documentation

## 2022-05-30 DIAGNOSIS — Z170 Estrogen receptor positive status [ER+]: Secondary | ICD-10-CM | POA: Diagnosis not present

## 2022-05-30 DIAGNOSIS — Z87440 Personal history of urinary (tract) infections: Secondary | ICD-10-CM | POA: Diagnosis not present

## 2022-05-30 NOTE — Assessment & Plan Note (Signed)
06/12/2006: Left breast cancer status post left lumpectomy T1CNX stage Ia grade 1 ER/PR positive HER2 negative Ki-67 3%, radiation, letrozole 03/23/2021: Right breast cancer T2N0 stage Ia grade 1 IDC ER/PR positive HER2 negative Ki-67 1% status post right lumpectomy stage Ib grade 2 IDC 0/5 lymph nodes, radiation, tamoxifen  Current treatment: Tamoxifen started 08/13/2021 Tamoxifen toxicities: Mild hot flashes  Her husband passed away 08/18/21: She was right at his bedside when he passed and she was very much at peace and contentment.  Hospice helped her tremendously.  Breast cancer surveillance: 1.  Breast exam 05/30/2022: Benign 2. 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 This result does not impact her screening recommendations at this time.   Return to clinic in 1 year for follow-up

## 2022-06-27 DIAGNOSIS — R9389 Abnormal findings on diagnostic imaging of other specified body structures: Secondary | ICD-10-CM | POA: Diagnosis not present

## 2022-06-27 DIAGNOSIS — N840 Polyp of corpus uteri: Secondary | ICD-10-CM | POA: Diagnosis not present

## 2022-08-20 DIAGNOSIS — Z79810 Long term (current) use of selective estrogen receptor modulators (SERMs): Secondary | ICD-10-CM | POA: Diagnosis not present

## 2022-08-20 DIAGNOSIS — N840 Polyp of corpus uteri: Secondary | ICD-10-CM | POA: Diagnosis not present

## 2022-08-21 DIAGNOSIS — N950 Postmenopausal bleeding: Secondary | ICD-10-CM | POA: Diagnosis not present

## 2022-08-21 DIAGNOSIS — N840 Polyp of corpus uteri: Secondary | ICD-10-CM | POA: Diagnosis not present

## 2022-08-21 DIAGNOSIS — R9389 Abnormal findings on diagnostic imaging of other specified body structures: Secondary | ICD-10-CM | POA: Diagnosis not present

## 2022-11-21 ENCOUNTER — Telehealth: Payer: Self-pay | Admitting: Adult Health

## 2022-11-21 NOTE — Telephone Encounter (Signed)
Rescheduled appointment per provider meeting. Patient is aware of the changes made to her upcoming appointment.

## 2022-11-29 ENCOUNTER — Ambulatory Visit: Payer: Medicare PPO | Admitting: Adult Health

## 2022-12-03 ENCOUNTER — Encounter: Payer: Self-pay | Admitting: Adult Health

## 2022-12-03 ENCOUNTER — Inpatient Hospital Stay: Payer: Medicare PPO | Attending: Adult Health | Admitting: Adult Health

## 2022-12-03 ENCOUNTER — Ambulatory Visit: Payer: Medicare PPO | Admitting: Adult Health

## 2022-12-03 ENCOUNTER — Other Ambulatory Visit: Payer: Self-pay

## 2022-12-03 VITALS — BP 126/59 | HR 90 | Temp 97.7°F | Resp 18 | Ht 66.0 in | Wt 136.4 lb

## 2022-12-03 DIAGNOSIS — M1612 Unilateral primary osteoarthritis, left hip: Secondary | ICD-10-CM | POA: Diagnosis not present

## 2022-12-03 DIAGNOSIS — E785 Hyperlipidemia, unspecified: Secondary | ICD-10-CM | POA: Diagnosis not present

## 2022-12-03 DIAGNOSIS — G2581 Restless legs syndrome: Secondary | ICD-10-CM | POA: Diagnosis not present

## 2022-12-03 DIAGNOSIS — C50112 Malignant neoplasm of central portion of left female breast: Secondary | ICD-10-CM | POA: Diagnosis not present

## 2022-12-03 DIAGNOSIS — E039 Hypothyroidism, unspecified: Secondary | ICD-10-CM | POA: Diagnosis not present

## 2022-12-03 DIAGNOSIS — I10 Essential (primary) hypertension: Secondary | ICD-10-CM | POA: Diagnosis not present

## 2022-12-03 DIAGNOSIS — Z87440 Personal history of urinary (tract) infections: Secondary | ICD-10-CM | POA: Diagnosis not present

## 2022-12-03 DIAGNOSIS — Z79810 Long term (current) use of selective estrogen receptor modulators (SERMs): Secondary | ICD-10-CM | POA: Insufficient documentation

## 2022-12-03 DIAGNOSIS — M8580 Other specified disorders of bone density and structure, unspecified site: Secondary | ICD-10-CM | POA: Insufficient documentation

## 2022-12-03 DIAGNOSIS — Z170 Estrogen receptor positive status [ER+]: Secondary | ICD-10-CM | POA: Diagnosis not present

## 2022-12-03 DIAGNOSIS — G5601 Carpal tunnel syndrome, right upper limb: Secondary | ICD-10-CM | POA: Diagnosis not present

## 2022-12-03 DIAGNOSIS — Z803 Family history of malignant neoplasm of breast: Secondary | ICD-10-CM | POA: Insufficient documentation

## 2022-12-03 DIAGNOSIS — Z923 Personal history of irradiation: Secondary | ICD-10-CM | POA: Insufficient documentation

## 2022-12-03 DIAGNOSIS — C50411 Malignant neoplasm of upper-outer quadrant of right female breast: Secondary | ICD-10-CM | POA: Diagnosis not present

## 2022-12-03 NOTE — Assessment & Plan Note (Signed)
Rebecca [name] is here today for follow-up of her stage Ia left breast cancer diagnosed in November 2017 status postlumpectomy, adjuvant radiation, and antiestrogen therapy with letrozole followed by right sided ER/PR positive stage Ia breast cancer diagnosed in August 2022 status post lumpectomy followed by adjuvant radiation, and antiestrogen therapy with tamoxifen that began in January 2023.  History of left breast cancer recent stage Ia ER/PR positive right-sided invasive ductal carcinoma: She is taking tamoxifen daily at 10 mg and will continue this.  She has no clinical or radiographic signs of breast cancer recurrence and her next mammogram is due August 2024. Health maintenance: Recommended she continue with healthy diet, exercise, and preventative healthcare as determined by her primary care provider who she is seeing regularly.  She will return in 6 months for continued follow-up with Dr. Pamelia Hoit.  She knows to call for any questions or concerns that may arise between now and her next visit.

## 2022-12-03 NOTE — Progress Notes (Signed)
[facility] Cancer Follow up:    Rebecca Polka., MD [address] [place] [address]   DIAGNOSIS:  Cancer Staging  Malignant neoplasm of central portion of left breast in female, estrogen receptor positive Staging form: Breast, AJCC 8th Edition - Clinical: Stage IA (cT1c, cN0, cM0, G1, ER+, PR+, HER2-) - Signed by Lowella Dell, MD on 04/27/2021 Histologic grading system: 3 grade system  Malignant neoplasm of upper-outer quadrant of right breast in female, estrogen receptor positive Staging form: Breast, AJCC 8th Edition - Clinical: Stage IA (cT1c, cN0, cM0, G1, ER+, PR+, HER2-) - Signed by Lowella Dell, MD on 04/27/2021 Histologic grading system: 3 grade system   SUMMARY OF ONCOLOGIC HISTORY: Oncology History  Malignant neoplasm of central portion of left breast in female, estrogen receptor positive  06/12/2016 Initial Diagnosis   left breast upper outer quadrant biopsy 06/12/2016 for invasive ductal carcinoma, grade 1   07/08/2016 Surgery   Left lumpectomy with no sentinel lymph node sampling: pT1c pNX, stage IA i IDC grade 1, ER/PR positive, with an MIB-1 of 3%.    Radiation Therapy   Adjuvant radiation    Anti-estrogen oral therapy   Letrozole 2.5 mg daily   03/23/2021 Initial Diagnosis   right breast upper outer quadrant 03/23/2021 for a clinical T2 N0, stage IA invasive ductal carcinoma, grade 1 or 2, ER/PR strongly positive, MIB-1 1%, with no amplification of HER2.   04/27/2021 Cancer Staging   Staging form: Breast, AJCC 8th Edition - Clinical: Stage IA (cT1c, cN0, cM0, G1, ER+, PR+, HER2-) - Signed by Lowella Dell, MD on 04/27/2021 Histologic grading system: 3 grade system   08/13/2021 -  Anti-estrogen oral therapy   Adjuvant tamoxifen   Malignant neoplasm of upper-outer quadrant of right breast in female, estrogen receptor positive   Radiation Therapy   Adjuvant radiation    Anti-estrogen oral therapy   Letrozole 2.5 mg  daily   03/23/2021 Initial Diagnosis   right breast upper outer quadrant 03/23/2021 for a clinical T2 N0, stage IA invasive ductal carcinoma, grade 1 or 2, ER/PR strongly positive, MIB-1 1%, with no amplification of HER2.   04/27/2021 Cancer Staging   Staging form: Breast, AJCC 8th Edition - Clinical: Stage IA (cT1c, cN0, cM0, G1, ER+, PR+, HER2-) - Signed by Lowella Dell, MD on 04/27/2021 Histologic grading system: 3 grade system   04/28/2021 Surgery   right lumpectomy and sentinel lymph node sampling for a pT2 pN0, stage IB invasive ductal carcinoma, grade 2, with a positive margin (resection of the margins negative)  total of 5 right axillary lymph nodes were negative   06/2021 - 07/21/2021 Radiation Therapy   Site Technique Total Dose (Gy) Dose per Fx (Gy) Completed Fx Beam Energies  Breast, Right: Breast_Rt 3D 42.72/42.72 2.67 16/16 6X       08/13/2021 -  Anti-estrogen oral therapy   Adjuvant tamoxifen     CURRENT THERAPY: Tamoxifen  INTERVAL HISTORY: Rebecca [name] 82 y.o. female returns for follow-up of her history of breast cancer on tamoxifen daily.  At her most recent visit with Dr. Pamelia Hoit she was decreased from 20 mg of tamoxifen to 10 mg of tamoxifen due to increased urinary tract infections.  Since this time her urinary tract infections have resolved.  She is tolerating tamoxifen well and denies any side effects.  Her most recent mammogram occurred on March 28, 2022 demonstrating no mammographic evidence of malignancy, a seroma of 4.2 cm in her  right breast, breast density category C.   Patient Active Problem List   Diagnosis Date Noted   Genetic testing 11/27/2021   Osteopenia 05/09/2021   Carpal tunnel syndrome of right wrist 05/09/2021   Malignant neoplasm of central portion of left breast in female, estrogen receptor positive 04/27/2021   Malignant neoplasm of upper-outer quadrant of right breast in female, estrogen receptor positive 04/27/2021   Status post  total replacement of left hip 03/06/2019   Unilateral primary osteoarthritis, left hip 01/19/2019   S/P ASA/PRK (advanced surface ablation photorefractive keratectomy) 08/02/2012   Status post laser cataract surgery 05/28/2012   Elevated blood-pressure reading without diagnosis of hypertension 09/13/2011   Hyperlipidemia 09/19/2010   Restless legs syndrome 08/22/2009   Impaired fasting glucose 08/22/2009   Hypothyroidism 08/22/2009    has No Known Allergies.  MEDICAL HISTORY: Past Medical History:  Diagnosis Date   Anemia    Breast CA    Left   GERD (gastroesophageal reflux disease)    History of cataract    History of hiatal hernia    History of radiation therapy    Right breast- 06/29/21-07/21/21- Dr. Antony Blackbird   Hypertension 2022   Hypothyroidism    OA (osteoarthritis)    Personal history of radiation therapy    PONV (postoperative nausea and vomiting)    Vitamin D deficiency     SURGICAL HISTORY: Past Surgical History:  Procedure Laterality Date   BREAST EXCISIONAL BIOPSY Left    BREAST LUMPECTOMY Left    BREAST LUMPECTOMY WITH RADIOACTIVE SEED AND SENTINEL LYMPH NODE BIOPSY Right 04/28/2021   Procedure: RIGHT BREAST LUMPECTOMY WITH RADIOACTIVE SEED x 2 AND SENTINEL LYMPH NODE BIOPSY;  Surgeon: Almond Lint, MD;  Location: [facility] OR;  Service: General;  Laterality: Right;   CATARACT EXTRACTION, BILATERAL     COLONOSCOPY  01/03/2007   RE-EXCISION OF BREAST LUMPECTOMY Right 05/15/2021   Procedure: RE-EXCISION OF RIGHT BREAST LUMPECTOMY;  Surgeon: Almond Lint, MD;  Location: [facility];  Service: General;  Laterality: Right;   TOTAL HIP ARTHROPLASTY Left 03/06/2019   Procedure: LEFT TOTAL HIP ARTHROPLASTY ANTERIOR APPROACH;  Surgeon: Kathryne Hitch, MD;  Location: [facility];  Service: Orthopedics;  Laterality: Left;   UPPER GI ENDOSCOPY  09/23/2007    SOCIAL HISTORY: Social History   Socioeconomic History   Marital status: Married    Spouse  name: Not on file   Number of children: Not on file   Years of education: Not on file   Highest education level: Not on file  Occupational History   Not on file  Tobacco Use   Smoking status: Never   Smokeless tobacco: Never  Vaping Use   Vaping Use: Never used  Substance and Sexual Activity   Alcohol use: Yes    Comment: maybe once a month   Drug use: Never   Sexual activity: Not on file  Other Topics Concern   Not on file  Social History Narrative   Not on file   Social Determinants of Health   Financial Resource Strain: Not on file  Food Insecurity: Not on file  Transportation Needs: Not on file  Physical Activity: Not on file  Stress: Not on file  Social Connections: Not on file  Intimate Partner Violence: Not on file    FAMILY HISTORY: Family History  Problem Relation Age of Onset   Kidney cancer Father        dx. 60s   Breast cancer Sister  dx. 58s   Kidney cancer Sister 29   Bladder Cancer Maternal Aunt     Review of Systems  Constitutional:  Negative for appetite change, chills, fatigue, fever and unexpected weight change.  HENT:   Negative for hearing loss, lump/mass and trouble swallowing.   Eyes:  Negative for eye problems and icterus.  Respiratory:  Negative for chest tightness, cough and shortness of breath.   Cardiovascular:  Negative for chest pain, leg swelling and palpitations.  Gastrointestinal:  Negative for abdominal distention, abdominal pain, constipation, diarrhea, nausea and vomiting.  Endocrine: Negative for hot flashes.  Genitourinary:  Negative for difficulty urinating.   Musculoskeletal:  Negative for arthralgias.  Skin:  Negative for itching and rash.  Neurological:  Negative for dizziness, extremity weakness, headaches and numbness.  Hematological:  Negative for adenopathy. Does not bruise/bleed easily.  Psychiatric/Behavioral:  Negative for depression. The patient is not nervous/anxious.       PHYSICAL EXAMINATION    Onc Performance Status - 12/03/22 1500       ECOG Perf Status   ECOG Perf Status Restricted in physically strenuous activity but ambulatory and able to carry out work of a light or sedentary nature, e.g., light house work, office work      KPS SCALE   KPS % SCORE Able to carry on normal activity, minor s/s of disease             Vitals:   12/03/22 1455  BP: (!) 126/59  Pulse: 90  Resp: 18  Temp: 97.7 F (36.5 C)  SpO2: 100%    Physical Exam Constitutional:      General: She is not in acute distress.    Appearance: Normal appearance. She is not toxic-appearing.  HENT:     Head: Normocephalic and atraumatic.  Eyes:     General: No scleral icterus. Cardiovascular:     Rate and Rhythm: Normal rate and regular rhythm.     Pulses: Normal pulses.     Heart sounds: Normal heart sounds.  Pulmonary:     Effort: Pulmonary effort is normal.     Breath sounds: Normal breath sounds.  Chest:     Comments: Right breast status postlumpectomy and radiation Left breast status postlumpectomy and radiation, seroma noted in left lower outer breast. Breast exam demonstrates no evidence of local recurrence. Abdominal:     General: Abdomen is flat. Bowel sounds are normal. There is no distension.     Palpations: Abdomen is soft.     Tenderness: There is no abdominal tenderness.  Musculoskeletal:        General: No swelling.     Cervical back: Neck supple.  Lymphadenopathy:     Cervical: No cervical adenopathy.  Skin:    General: Skin is warm and dry.     Findings: No rash.  Neurological:     General: No focal deficit present.     Mental Status: She is alert.  Psychiatric:        Mood and Affect: Mood normal.        Behavior: Behavior normal.     LABORATORY DATA: None for this visit    ASSESSMENT and THERAPY PLAN:   Malignant neoplasm of upper-outer quadrant of right breast in female, estrogen receptor positive (HCC) Rebecca [name] is here today for follow-up of her stage Ia left  breast cancer diagnosed in November 2017 status postlumpectomy, adjuvant radiation, and antiestrogen therapy with letrozole followed by right sided ER/PR positive stage Ia breast cancer diagnosed in August  2022 status post lumpectomy followed by adjuvant radiation, and antiestrogen therapy with tamoxifen that began in January 2023.  History of left breast cancer recent stage Ia ER/PR positive right-sided invasive ductal carcinoma: She is taking tamoxifen daily at 10 mg and will continue this.  She has no clinical or radiographic signs of breast cancer recurrence and her next mammogram is due August 2024. Health maintenance: Recommended she continue with healthy diet, exercise, and preventative healthcare as determined by her primary care provider who she is seeing regularly.  She will return in 6 months for continued follow-up with Dr. Pamelia Hoit.  She knows to call for any questions or concerns that may arise between now and her next visit.    All questions were answered. The patient knows to call the clinic with any problems, questions or concerns. We can certainly see the patient much sooner if necessary.  Total encounter time:20 minutes*in face-to-face visit time, chart review, lab review, care coordination, order entry, and documentation of the encounter time.    Lillard Anes, NP 12/03/22 3:39 PM Medical Oncology and Hematology [facility] [address] [place], [place] [address] Tel. [phone]    Fax. [phone]  *Total Encounter Time as defined by the Centers for Medicare and Medicaid Services includes, in addition to the face-to-face time of a patient visit (documented in the note above) non-face-to-face time: obtaining and reviewing outside history, ordering and reviewing medications, tests or procedures, care coordination (communications with other health care professionals or caregivers) and documentation in the medical record.

## 2022-12-20 ENCOUNTER — Other Ambulatory Visit: Payer: Self-pay | Admitting: *Deleted

## 2022-12-20 MED ORDER — TAMOXIFEN CITRATE 20 MG PO TABS: 10.0000 mg | ORAL_TABLET | Freq: Every day | ORAL | 3 refills | Status: DC

## 2023-01-06 IMAGING — MR MR BREAST BILAT WO/W CM
8 of 12 series · 32 of 48 positions shown · IV contrast (gadavist)
Comparison: None

CLINICAL DATA: 80-year-old female with newly diagnosed invasive
mammary carcinoma of the right breast. Pretreatment staging exam.
History of remote left breast cancer status post lumpectomy.

LABS:  None.
EXAM:
BILATERAL BREAST MRI WITH AND WITHOUT CONTRAST
TECHNIQUE: Multiplanar, multisequence MR images of both breasts were obtained
prior to and following the intravenous administration of 6 ml of
Gadavist

[Series 2: t2_tirm_tra ipat (a-p) · axial · 3.0mm · 0.70mm/px · 1 of 59 slices shown]
[im 1/59]
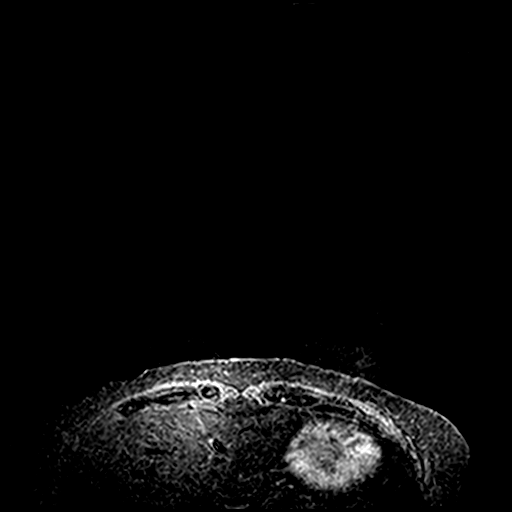

[Series 3: fl3d pre-cm no · axial · non-contrast · 1.2mm · 0.94mm/px · z∈[-84,+88]mm · 5 of 144 slices shown]
[im 1/144]
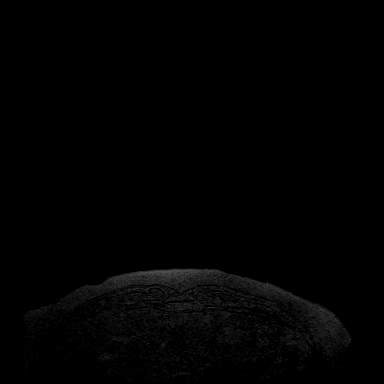
[im 36/144]
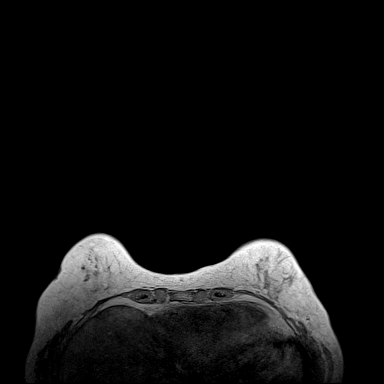
[im 72/144]
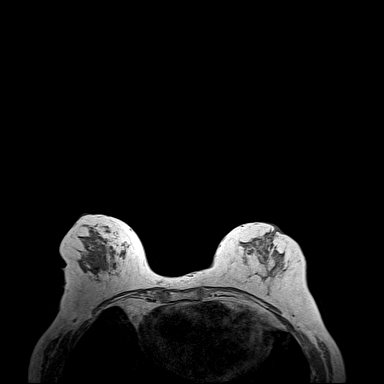
[im 108/144]
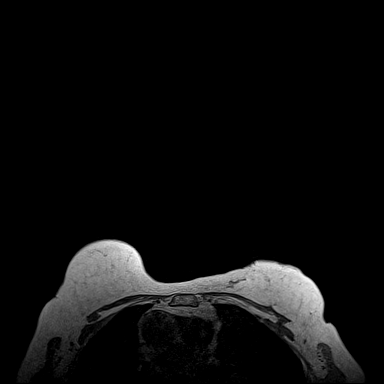
[im 144/144]
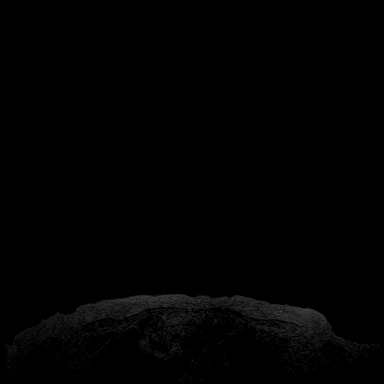

[Series 4: fl3d pre-cm · axial · non-contrast · 1.2mm · 0.94mm/px · z∈[-84,+88]mm · 5 of 144 slices shown]
[im 1/144]
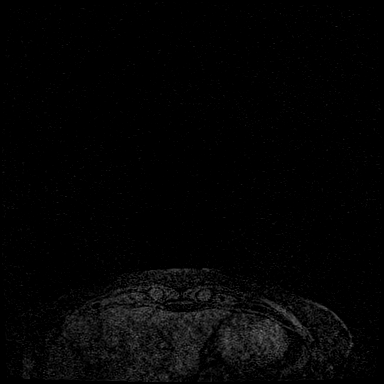
[im 36/144]
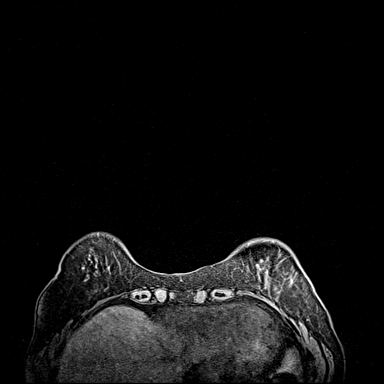
[im 72/144]
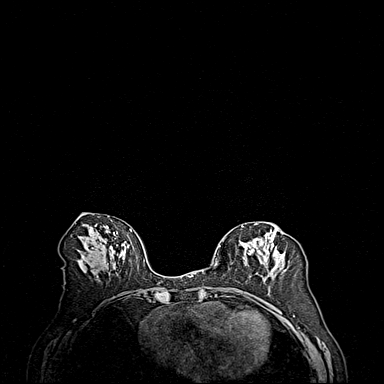
[im 108/144]
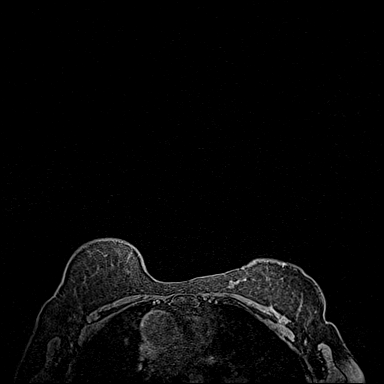
[im 144/144]
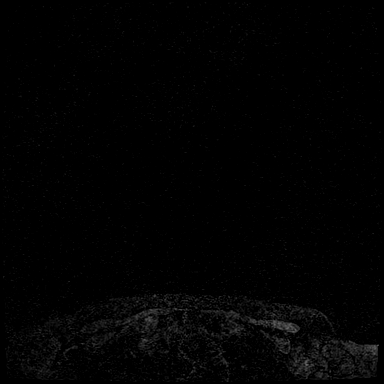

[Series 5: fl3d post immediate · axial · 1.2mm · 0.94mm/px · z∈[-84,+88]mm · 5 of 144 slices shown (1 of 3)]
[im 1/144]
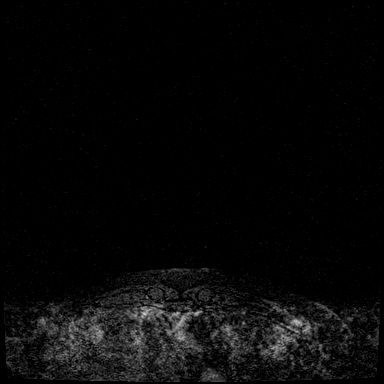
[im 36/144]
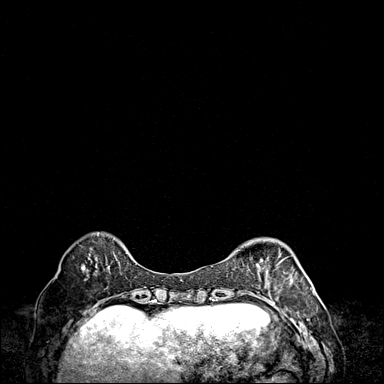
[im 72/144]
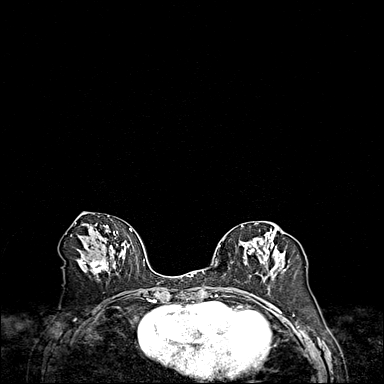
[im 108/144]
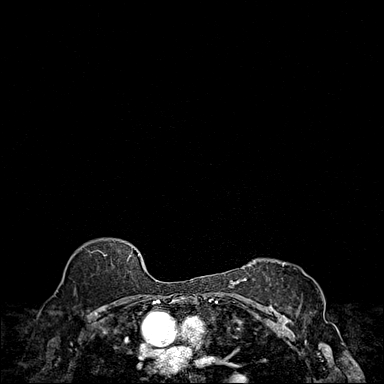
[im 144/144]
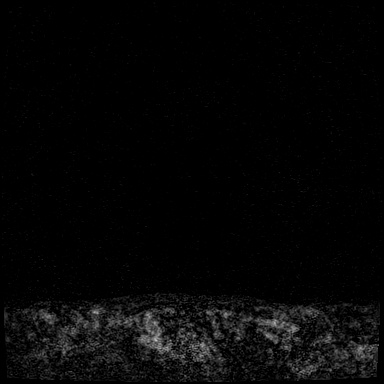

[Series 6: fl3d post immediate · axial · 1.2mm · 0.94mm/px · z∈[-84,+88]mm · 5 of 144 slices shown (2 of 3)]
[im 1/144]
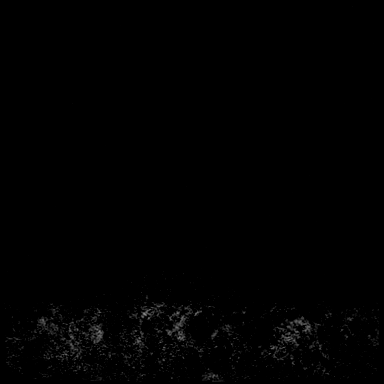
[im 36/144]
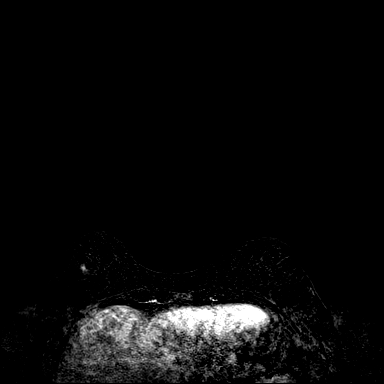
[im 72/144]
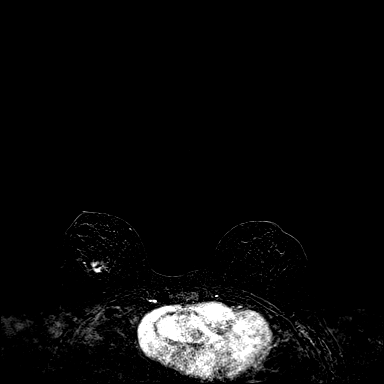
[im 108/144]
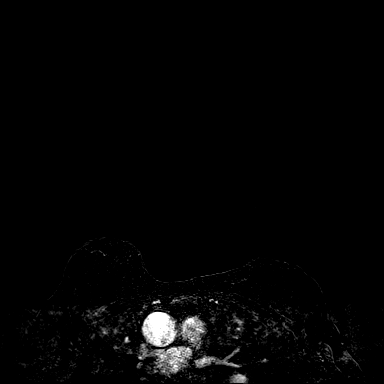
[im 144/144]
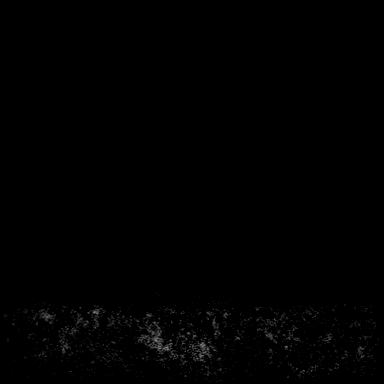

[Series 7: fl3d post immediate · axial · 172.8mm · 0.94mm/px · 1 of 1 slices shown (3 of 3)]
[im 1/1]
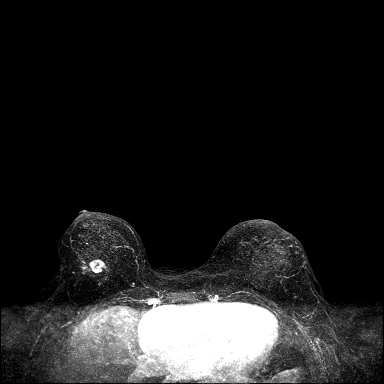

[Series 8: fl3d post 3min · axial · 1.2mm · 0.94mm/px · z∈[-84,+88]mm · 6 of 144 slices shown]
[im 1/144]
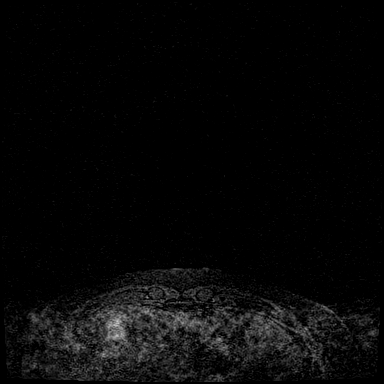
[im 29/144]
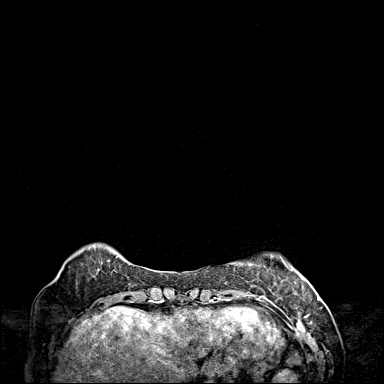
[im 58/144]
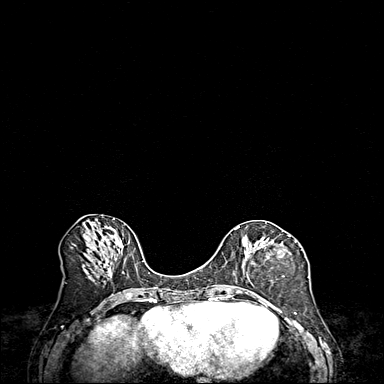
[im 86/144]
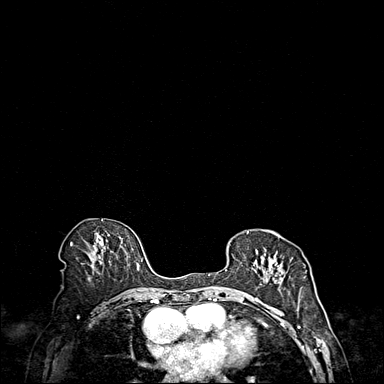
[im 115/144]
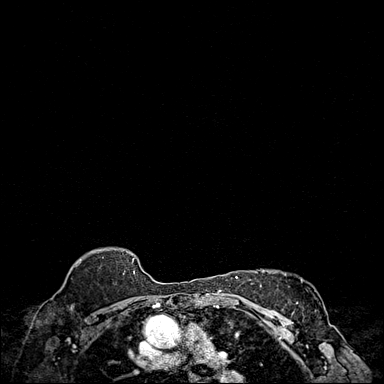
[im 144/144]
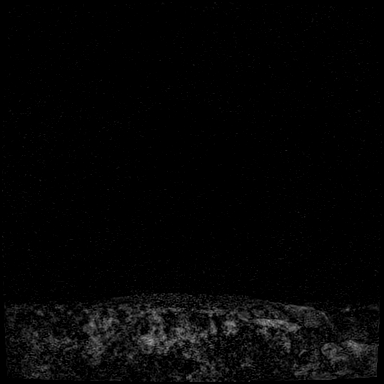

[Series 9: fl3d post 3min_sub · axial · 1.2mm · 0.94mm/px · z∈[-84,+18]mm · 4 of 144 slices shown]
[im 1/144]
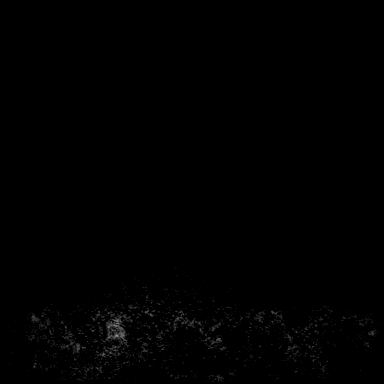
[im 29/144]
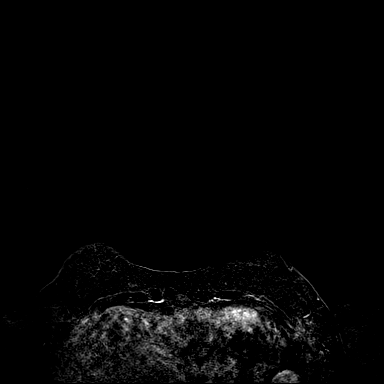
[im 58/144]
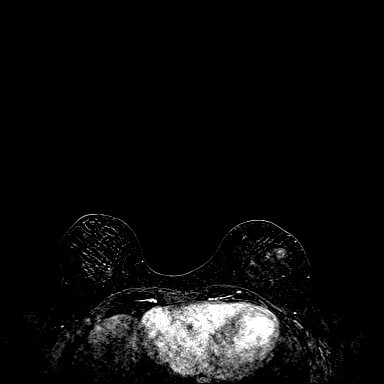
[im 86/144]
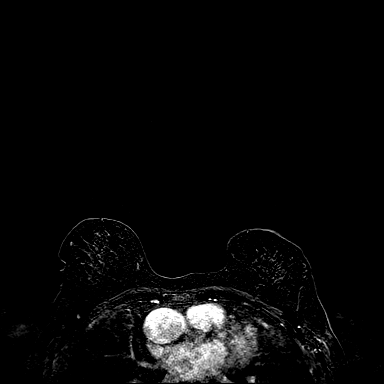

[32 of 48 positions shown; findings below may reference images not displayed]

Three-dimensional MR images were rendered by post-processing of the
original MR data on an independent workstation. The
three-dimensional MR images were interpreted, and findings are
reported in the following complete MRI report for this study. Three
dimensional images were evaluated at the independent interpreting
workstation using the DynaCAD thin client.
FINDINGS: Breast composition: c. Heterogeneous fibroglandular tissue.

Background parenchymal enhancement: Minimal

Right breast: There is susceptibility artifact in the upper outer
right breast at the site of recent biopsy. Associated with a biopsy
marking clip there is an irregular enhancing mass measuring 1.2 x
1.5 x 1.5 cm (series 6, image 76). In the lower outer right breast
there is focal non-mass enhancement measuring approximately 1.2 x
0.4 x 0.3 cm (series 9, image 107). Tiny benign intramammary lymph
node in the lower inner right breast.

Left breast: Benign surgical changes including fat necrosis from
prior lumpectomy. No mass or abnormal enhancement.

Lymph nodes: No abnormal appearing lymph nodes.

Ancillary findings:  None.
IMPRESSION: 1. Biopsy-proven malignancy in the upper-outer right breast
measuring 1.2 x 1.5 x 1.5 cm.
2. Indeterminate focal non-mass enhancement in the lower outer right
breast measuring 1.2 x 0.4 x 0.3 cm.
3. No MRI evidence of malignancy in the left breast.
4. No suspicious lymphadenopathy.

RECOMMENDATION:
MRI guided biopsy of the focal non-mass enhancement in the lower
outer right breast.

BI-RADS CATEGORY  4: Suspicious.

## 2023-02-05 IMAGING — MG MM PLC BREAST LOC DEV 1ST LESION INC*R*
8 series · 8 of 8 positions shown · non-contrast
Comparison: Previous exam(s).

CLINICAL DATA: Localization of the ribbon shaped clip and the
dumbbell shaped clip prior to surgery.

EXAM:
MAMMOGRAPHIC GUIDED RADIOACTIVE SEED LOCALIZATION OF THE RIGHT
BREAST

[R CC (1 of 4)]
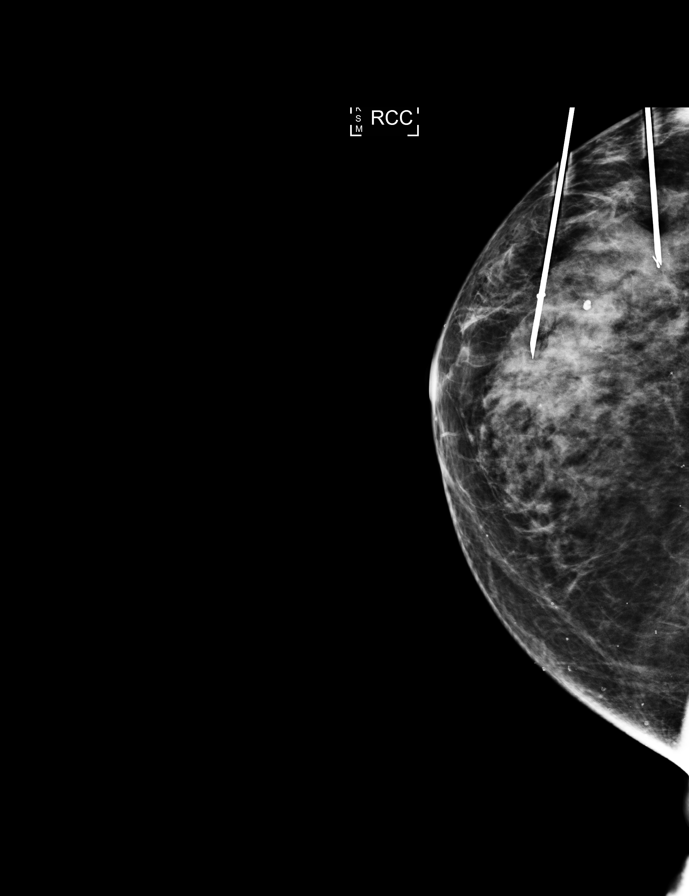

[R LM (1 of 4)]
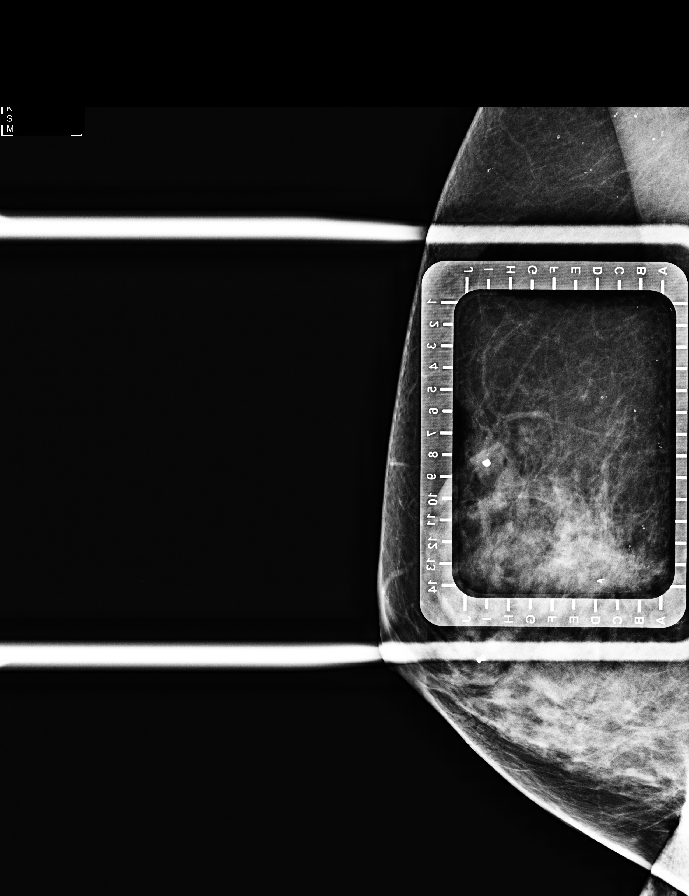

[R LM (2 of 4)]
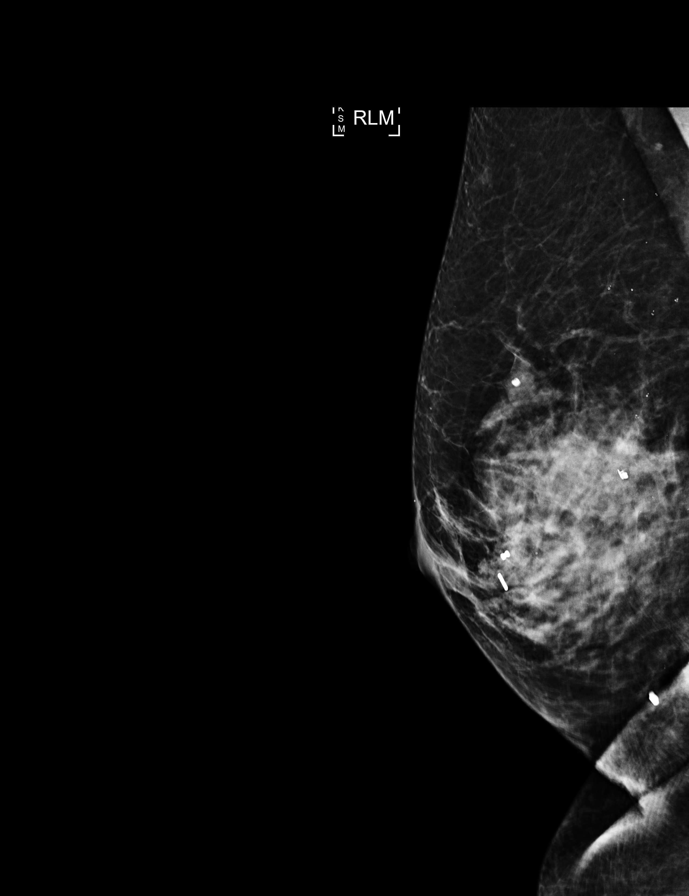

[R CC (2 of 4)]
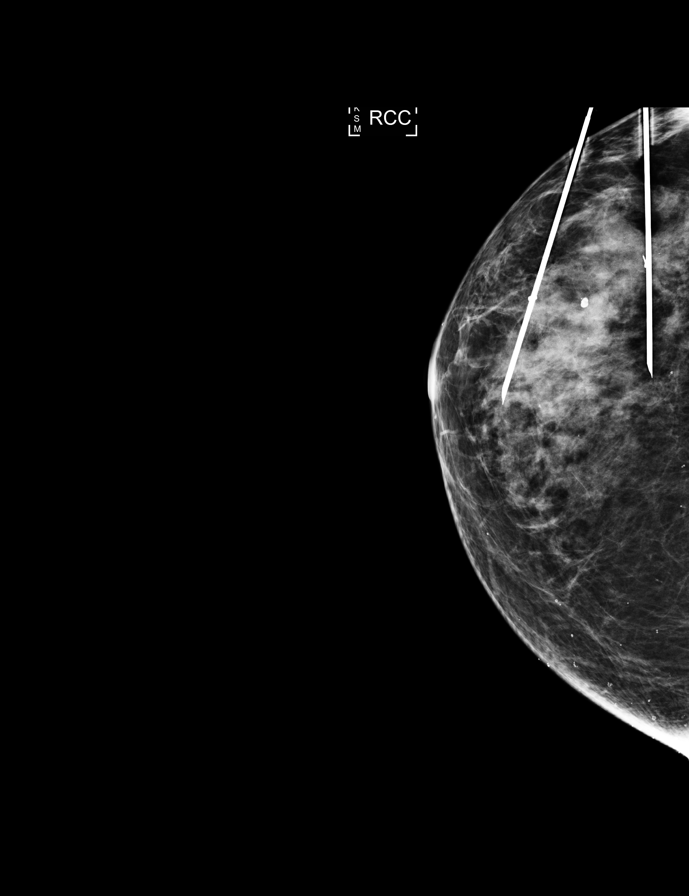

[R CC (3 of 4)]
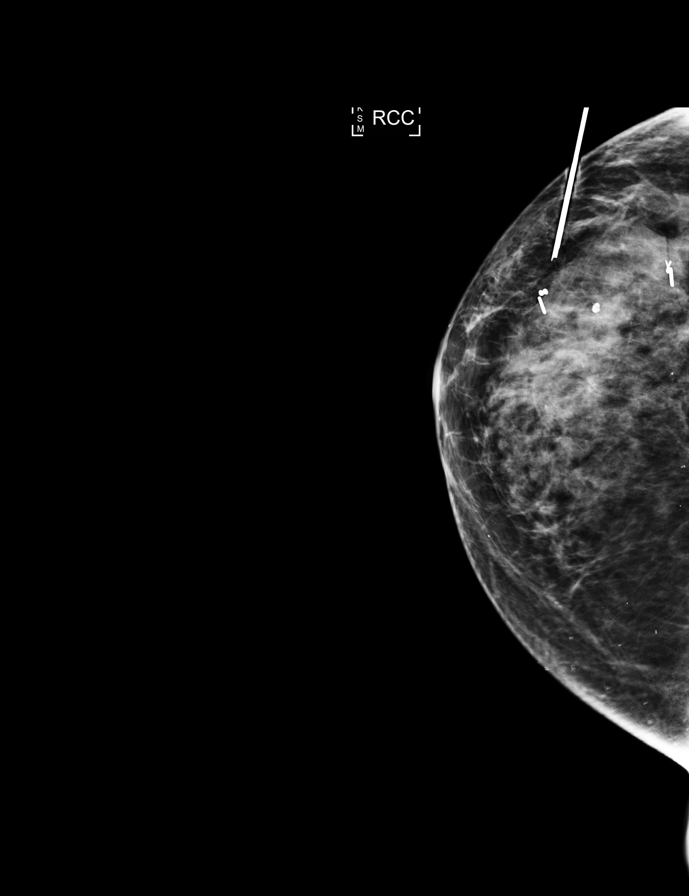

[R LM (3 of 4)]
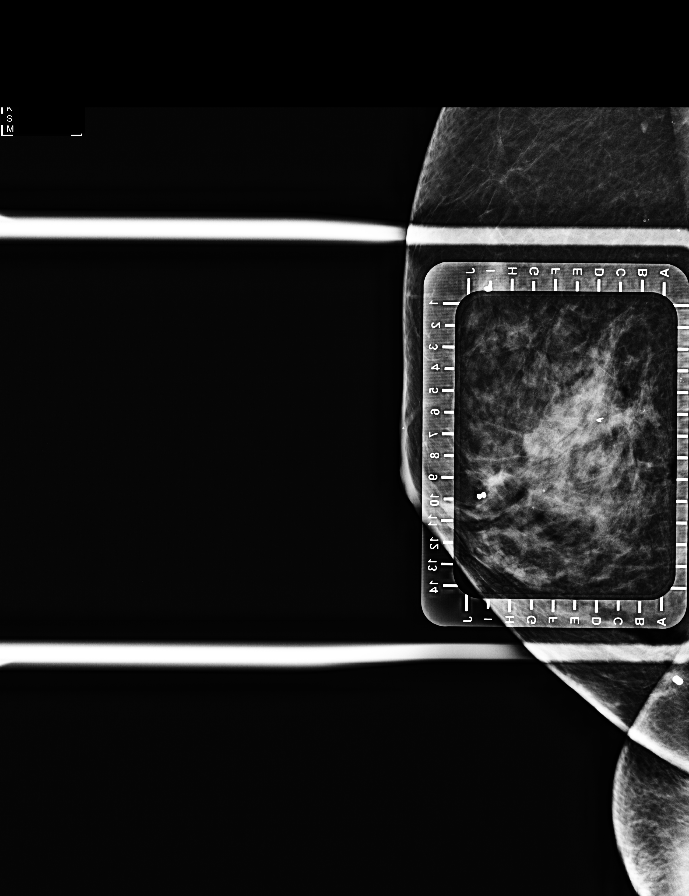

[R LM (4 of 4)]
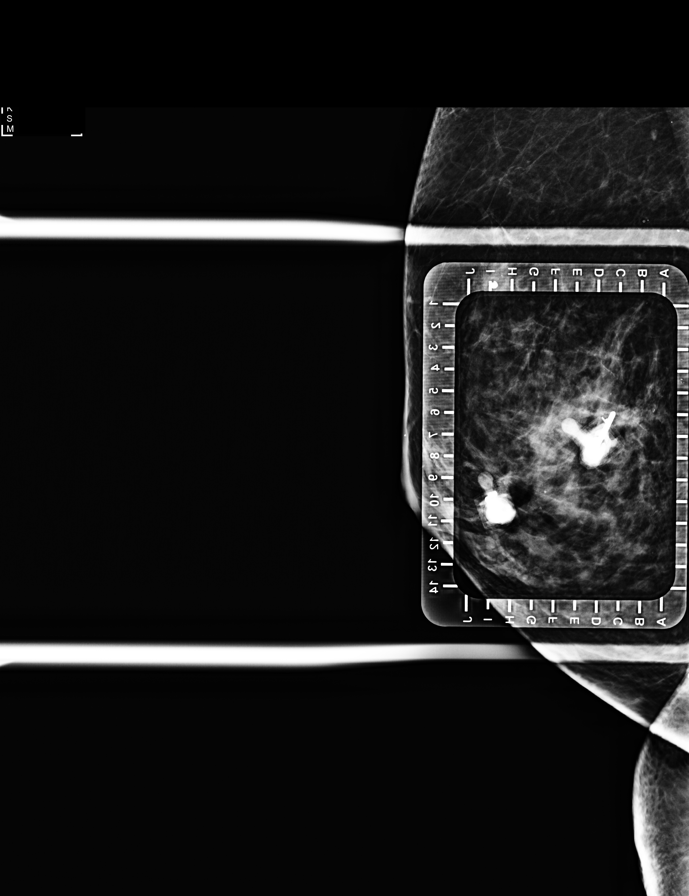

[R CC (4 of 4)]
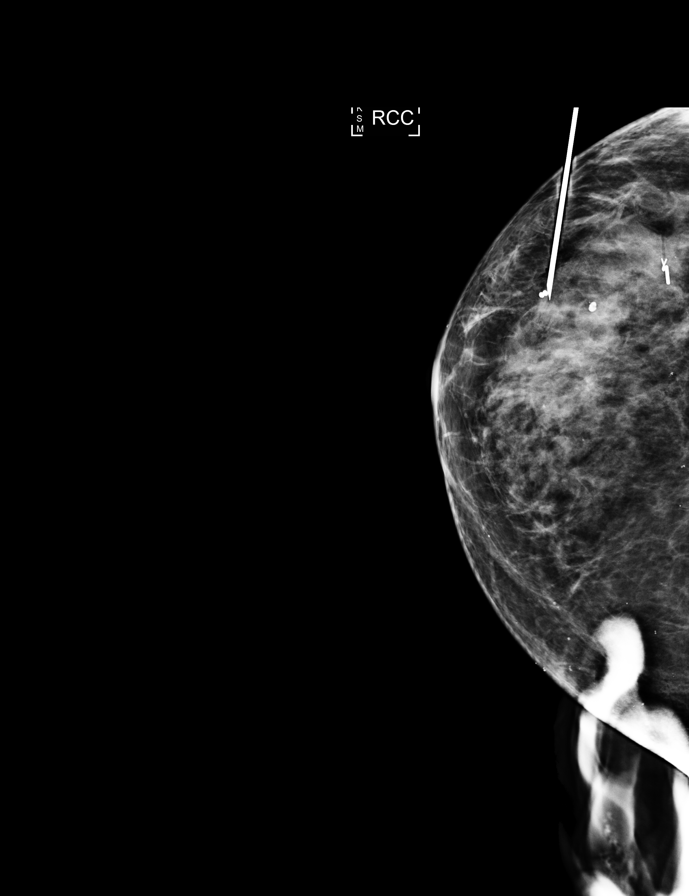

[8 of 8 positions shown; findings below may reference images not displayed]



The usual time-out protocol was performed immediately prior to the
procedure.

Using mammographic guidance, sterile technique, 1% lidocaine and an
Z-BC2 radioactive seed, the ribbon shaped clip was localized using a
lateral approach. The follow-up mammogram images confirm the seed in
the expected location and were marked for the surgeon.

Follow-up survey of the patient confirms presence of the radioactive
seed.

Order number of Z-BC2 seed:  [id number].

Total activity:  0.238 millicurie reference Date: April 04, 2021

Using mammographic guidance, sterile technique, 1% lidocaine and an
Z-BC2 radioactive seed, the dumbbell shaped clip was localized using
a lateral approach. The follow-up mammogram images confirm the seed
in the expected location and were marked for the surgeon.

Follow-up survey of the patient confirms presence of the radioactive
seed.

Order number of Z-BC2 seed:  [id number].

Total activity:  0.238 millicuries reference Date: April 04, 2021

The patient tolerated the procedure well and was released from the
[REDACTED]. She was given instructions regarding seed removal.
IMPRESSION: Radioactive seed localization right breast. No apparent
complications.

## 2023-02-06 IMAGING — MG MM BREAST SURGICAL SPECIMEN
1 series · 1 of 1 positions shown · non-contrast
Comparison: Previous exam(s).

CLINICAL DATA: Evaluate surgical specimen following RIGHT
lumpectomy for RIGHT breast cancer and lobular neoplasia.

EXAM:
SPECIMEN RADIOGRAPH OF THE RIGHT BREAST

[R]
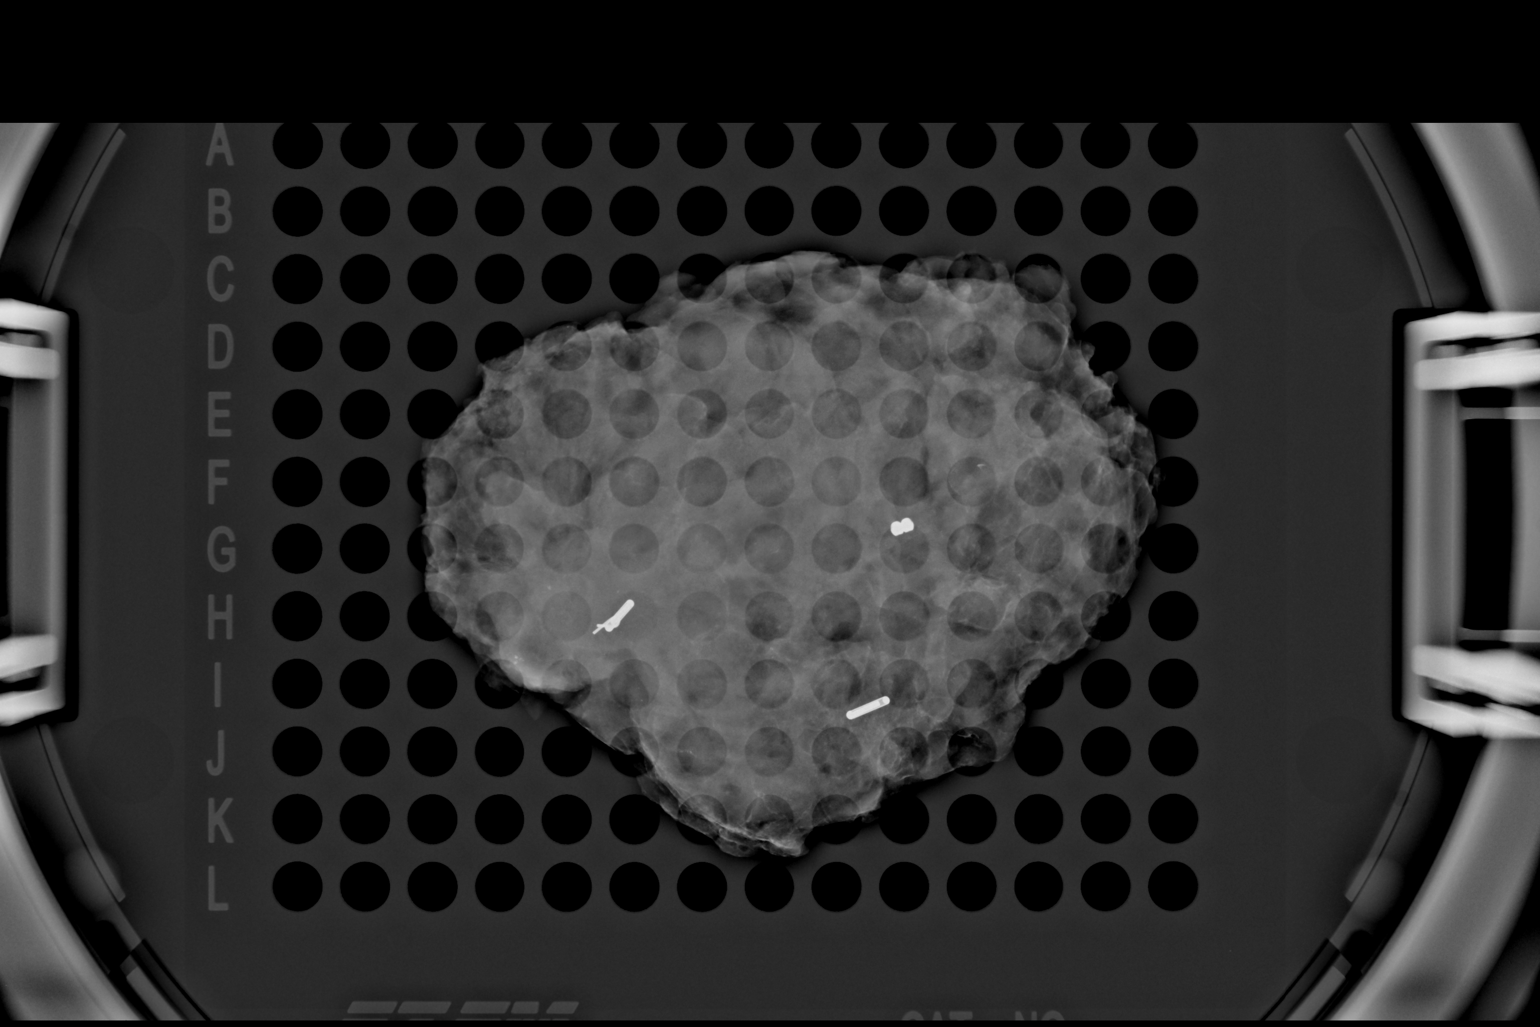

[1 of 1 positions shown; findings below may reference images not displayed]

FINDINGS: Status post excision of the RIGHT breast. The 2 radioactive seeds,
RIBBON biopsy marker clip, and BARBELL biopsy marker clip are
present, completely intact, and were marked for pathology.
IMPRESSION: Specimen radiograph of the RIGHT breast.

## 2023-02-22 ENCOUNTER — Other Ambulatory Visit: Payer: Self-pay | Admitting: Adult Health

## 2023-02-22 DIAGNOSIS — Z853 Personal history of malignant neoplasm of breast: Secondary | ICD-10-CM

## 2023-03-04 DIAGNOSIS — K120 Recurrent oral aphthae: Secondary | ICD-10-CM | POA: Diagnosis not present

## 2023-03-04 DIAGNOSIS — R220 Localized swelling, mass and lump, head: Secondary | ICD-10-CM | POA: Diagnosis not present

## 2023-03-07 DIAGNOSIS — E785 Hyperlipidemia, unspecified: Secondary | ICD-10-CM | POA: Diagnosis not present

## 2023-03-07 DIAGNOSIS — R7301 Impaired fasting glucose: Secondary | ICD-10-CM | POA: Diagnosis not present

## 2023-03-07 DIAGNOSIS — N1831 Chronic kidney disease, stage 3a: Secondary | ICD-10-CM | POA: Diagnosis not present

## 2023-03-07 DIAGNOSIS — I129 Hypertensive chronic kidney disease with stage 1 through stage 4 chronic kidney disease, or unspecified chronic kidney disease: Secondary | ICD-10-CM | POA: Diagnosis not present

## 2023-03-07 DIAGNOSIS — D649 Anemia, unspecified: Secondary | ICD-10-CM | POA: Diagnosis not present

## 2023-03-07 DIAGNOSIS — E039 Hypothyroidism, unspecified: Secondary | ICD-10-CM | POA: Diagnosis not present

## 2023-03-07 DIAGNOSIS — M810 Age-related osteoporosis without current pathological fracture: Secondary | ICD-10-CM | POA: Diagnosis not present

## 2023-03-14 DIAGNOSIS — E785 Hyperlipidemia, unspecified: Secondary | ICD-10-CM | POA: Diagnosis not present

## 2023-03-14 DIAGNOSIS — I129 Hypertensive chronic kidney disease with stage 1 through stage 4 chronic kidney disease, or unspecified chronic kidney disease: Secondary | ICD-10-CM | POA: Diagnosis not present

## 2023-03-14 DIAGNOSIS — M810 Age-related osteoporosis without current pathological fracture: Secondary | ICD-10-CM | POA: Diagnosis not present

## 2023-03-14 DIAGNOSIS — N1831 Chronic kidney disease, stage 3a: Secondary | ICD-10-CM | POA: Diagnosis not present

## 2023-03-14 DIAGNOSIS — Z1339 Encounter for screening examination for other mental health and behavioral disorders: Secondary | ICD-10-CM | POA: Diagnosis not present

## 2023-03-14 DIAGNOSIS — E039 Hypothyroidism, unspecified: Secondary | ICD-10-CM | POA: Diagnosis not present

## 2023-03-14 DIAGNOSIS — R7301 Impaired fasting glucose: Secondary | ICD-10-CM | POA: Diagnosis not present

## 2023-03-14 DIAGNOSIS — C50911 Malignant neoplasm of unspecified site of right female breast: Secondary | ICD-10-CM | POA: Diagnosis not present

## 2023-03-14 DIAGNOSIS — Z170 Estrogen receptor positive status [ER+]: Secondary | ICD-10-CM | POA: Diagnosis not present

## 2023-03-14 DIAGNOSIS — Z1331 Encounter for screening for depression: Secondary | ICD-10-CM | POA: Diagnosis not present

## 2023-03-14 DIAGNOSIS — D649 Anemia, unspecified: Secondary | ICD-10-CM | POA: Diagnosis not present

## 2023-03-14 DIAGNOSIS — Z23 Encounter for immunization: Secondary | ICD-10-CM | POA: Diagnosis not present

## 2023-03-14 DIAGNOSIS — Z0000 Encounter for general adult medical examination without abnormal findings: Secondary | ICD-10-CM | POA: Diagnosis not present

## 2023-03-20 ENCOUNTER — Telehealth: Payer: Self-pay

## 2023-03-20 NOTE — Telephone Encounter (Signed)
Pt called and c/o mouth sores. She has been to urgent care and received magic mouth wash and it has not helped. She wonders if this is related to Tamoxifen. Advised pt this is not a common side effect of Tamoxifen but MD recommends stopping for 2 weeks then reassess to see if mouth sores resolve. She will f/u with Lillard Anes, DNP 04/08/23 at 1000 to further evaluate.

## 2023-04-01 ENCOUNTER — Ambulatory Visit
Admission: RE | Admit: 2023-04-01 | Discharge: 2023-04-01 | Disposition: A | Discharge status: Home / Self Care | Payer: Medicare PPO | Source: Ambulatory Visit | Attending: Adult Health | Admitting: Adult Health

## 2023-04-01 DIAGNOSIS — Z853 Personal history of malignant neoplasm of breast: Secondary | ICD-10-CM

## 2023-04-01 DIAGNOSIS — Z9889 Other specified postprocedural states: Secondary | ICD-10-CM | POA: Diagnosis not present

## 2023-04-08 ENCOUNTER — Encounter: Payer: Medicare PPO | Admitting: Adult Health

## 2023-05-21 ENCOUNTER — Telehealth: Payer: Self-pay | Admitting: Hematology and Oncology

## 2023-05-21 NOTE — Telephone Encounter (Signed)
05/21/23 Due to a change in the provider schedule; I called the patient and rescheduled her appointment. The patient is aware of the new appointment date and time

## 2023-05-27 DIAGNOSIS — R9389 Abnormal findings on diagnostic imaging of other specified body structures: Secondary | ICD-10-CM | POA: Diagnosis not present

## 2023-05-27 DIAGNOSIS — N8500 Endometrial hyperplasia, unspecified: Secondary | ICD-10-CM | POA: Diagnosis not present

## 2023-05-27 DIAGNOSIS — Z01419 Encounter for gynecological examination (general) (routine) without abnormal findings: Secondary | ICD-10-CM | POA: Diagnosis not present

## 2023-05-27 DIAGNOSIS — Z79810 Long term (current) use of selective estrogen receptor modulators (SERMs): Secondary | ICD-10-CM | POA: Diagnosis not present

## 2023-05-29 ENCOUNTER — Encounter: Payer: Self-pay | Admitting: Obstetrics and Gynecology

## 2023-06-03 ENCOUNTER — Ambulatory Visit: Payer: Medicare PPO | Admitting: Hematology and Oncology

## 2023-07-05 ENCOUNTER — Inpatient Hospital Stay: Payer: Medicare PPO | Attending: Hematology and Oncology | Admitting: Hematology and Oncology

## 2023-07-05 VITALS — BP 132/54 | HR 67 | Temp 97.2°F | Resp 18 | Ht 66.0 in | Wt 134.6 lb

## 2023-07-05 DIAGNOSIS — Z79810 Long term (current) use of selective estrogen receptor modulators (SERMs): Secondary | ICD-10-CM | POA: Diagnosis not present

## 2023-07-05 DIAGNOSIS — C50411 Malignant neoplasm of upper-outer quadrant of right female breast: Secondary | ICD-10-CM | POA: Insufficient documentation

## 2023-07-05 DIAGNOSIS — C50112 Malignant neoplasm of central portion of left female breast: Secondary | ICD-10-CM | POA: Diagnosis not present

## 2023-07-05 DIAGNOSIS — Z803 Family history of malignant neoplasm of breast: Secondary | ICD-10-CM | POA: Diagnosis not present

## 2023-07-05 DIAGNOSIS — Z923 Personal history of irradiation: Secondary | ICD-10-CM | POA: Diagnosis not present

## 2023-07-05 DIAGNOSIS — Z170 Estrogen receptor positive status [ER+]: Secondary | ICD-10-CM | POA: Diagnosis not present

## 2023-07-05 DIAGNOSIS — Z87440 Personal history of urinary (tract) infections: Secondary | ICD-10-CM | POA: Diagnosis not present

## 2023-07-05 NOTE — Assessment & Plan Note (Addendum)
06/12/2006: Left breast cancer status post left lumpectomy T1CNX stage Ia grade 1 ER/PR positive HER2 negative Ki-67 3%, radiation, letrozole 03/23/2021: Right breast cancer T2N0 stage Ia grade 1 IDC ER/PR positive HER2 negative Ki-67 1% status post right lumpectomy stage Ib grade 2 IDC 0/5 lymph nodes, radiation, tamoxifen   Current treatment: Tamoxifen started 08/13/2021 Tamoxifen toxicities: Mild hot flashes, frequent urinary infections, leg cramps We reduced the dosage of tamoxifen to 10 mg a day 06/2022. Patient has felt significantly better with the reduced dosage.  Sores on the gums: Self managed with home remedies and finally got better on its own.  It was felt not to be related to tamoxifen.   Breast cancer surveillance: 1.  Breast exam 07/05/2023: Benign 2. 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 This result does not impact her screening recommendations at this time.    Return to clinic in 1 year for follow-up

## 2023-07-05 NOTE — Progress Notes (Signed)
Patient Care Team: Cleatis Polka., MD as PCP - General (Internal Medicine) Almond Lint, MD as Consulting Physician (General Surgery) Antony Blackbird, MD as Consulting Physician (Radiation Oncology) Serena Croissant, MD as Consulting Physician (Hematology and Oncology) Huel Cote, MD as Consulting Physician (Obstetrics and Gynecology)  DIAGNOSIS:  Encounter Diagnosis  Name Primary?   Malignant neoplasm of central portion of left breast in female, estrogen receptor positive (HCC) Yes    SUMMARY OF ONCOLOGIC HISTORY: Oncology History  Malignant neoplasm of central portion of left breast in female, estrogen receptor positive (HCC)  06/12/2016 Initial Diagnosis   left breast upper outer quadrant biopsy 06/12/2016 for invasive ductal carcinoma, grade 1   07/08/2016 Surgery   Left lumpectomy with no sentinel lymph node sampling: pT1c pNX, stage IA i IDC grade 1, ER/PR positive, with an MIB-1 of 3%.    Radiation Therapy   Adjuvant radiation    Anti-estrogen oral therapy   Letrozole 2.5 mg daily   03/23/2021 Initial Diagnosis   right breast upper outer quadrant 03/23/2021 for a clinical T2 N0, stage IA invasive ductal carcinoma, grade 1 or 2, ER/PR strongly positive, MIB-1 1%, with no amplification of HER2.   04/27/2021 Cancer Staging   Staging form: Breast, AJCC 8th Edition - Clinical: Stage IA (cT1c, cN0, cM0, G1, ER+, PR+, HER2-) - Signed by Lowella Dell, MD on 04/27/2021 Histologic grading system: 3 grade system   08/13/2021 -  Anti-estrogen oral therapy   Adjuvant tamoxifen   Malignant neoplasm of upper-outer quadrant of right breast in female, estrogen receptor positive (HCC)   Radiation Therapy   Adjuvant radiation    Anti-estrogen oral therapy   Letrozole 2.5 mg daily   03/23/2021 Initial Diagnosis   right breast upper outer quadrant 03/23/2021 for a clinical T2 N0, stage IA invasive ductal carcinoma, grade 1 or 2, ER/PR strongly positive, MIB-1 1%, with no  amplification of HER2.   04/27/2021 Cancer Staging   Staging form: Breast, AJCC 8th Edition - Clinical: Stage IA (cT1c, cN0, cM0, G1, ER+, PR+, HER2-) - Signed by Lowella Dell, MD on 04/27/2021 Histologic grading system: 3 grade system   04/28/2021 Surgery   right lumpectomy and sentinel lymph node sampling for a pT2 pN0, stage IB invasive ductal carcinoma, grade 2, with a positive margin (resection of the margins negative)  total of 5 right axillary lymph nodes were negative   06/2021 - 07/21/2021 Radiation Therapy   Site Technique Total Dose (Gy) Dose per Fx (Gy) Completed Fx Beam Energies  Breast, Right: Breast_Rt 3D 42.72/42.72 2.67 16/16 6X       08/13/2021 -  Anti-estrogen oral therapy   Adjuvant tamoxifen     CHIEF COMPLIANT: Follow-up on tamoxifen therapy  HISTORY OF PRESENT ILLNESS:   History of Present Illness   Rebecca [name], a patient with a history of breast cancer, has been on tamoxifen for two years. The dosage was reduced during her last visit due to multiple concerns, which she does not recall. Recently, she experienced sores in her gums for about six to eight weeks. The sores were painful and sensitive to acidic and spicy foods. She managed the condition herself with home remedies, including lysine and peroxide mouth rinses. The sores have since resolved, and she can now consume tomatoes and other acidic foods without discomfort. Rebecca [name] is very active, engaging in gardening and regular walks, including stair climbing. She expressed a concern about maintaining her mental health and considered taking natural supplements like coconut oil. However,  she decided against it and continues to focus on staying active and eating a healthy diet.         ALLERGIES:  has No Known Allergies.  MEDICATIONS:  Current Outpatient Medications  Medication Sig Dispense Refill   alendronate (FOSAMAX) 70 MG tablet      B Complex-C (SUPER B COMPLEX PO) Take 1 tablet by mouth daily.      cholecalciferol (VITAMIN D3) 25 MCG (1000 UT) tablet Take 1,000 Units by mouth daily.     levothyroxine (SYNTHROID) 75 MCG tablet Take 75 mcg by mouth daily before breakfast.      olmesartan (BENICAR) 20 MG tablet Take 20 mg by mouth daily.     omeprazole (PRILOSEC) 20 MG capsule Take 20 mg by mouth daily as needed (acid reflux).     POLY-IRON 150 150 MG capsule Take 150 mg by mouth daily.      tamoxifen (NOLVADEX) 20 MG tablet Take 0.5 tablets (10 mg total) by mouth daily. 90 tablet 3   No current facility-administered medications for this visit.    PHYSICAL EXAMINATION: ECOG PERFORMANCE STATUS: 1 - Symptomatic but completely ambulatory  Vitals:   07/05/23 0821  BP: (!) 132/54  Pulse: 67  Resp: 18  Temp: (!) 97.2 F (36.2 C)  SpO2: 100%   Filed Weights   07/05/23 0821  Weight: 134 lb 9.6 oz (61.1 kg)    Physical Exam no palpable lumps or nodules in the breast or chest wall or axilla.        (exam performed in the presence of a chaperone)  LABORATORY DATA:  I have reviewed the data as listed    Latest Ref Rng & Units 04/19/2021   10:00 AM 03/07/2019    3:18 AM 07/10/2011    3:26 PM  CMP  Glucose 70 - 99 mg/dL 98  409  88   BUN 8 - 23 mg/dL 30  18  22    Creatinine 0.44 - 1.00 mg/dL 8.11  9.14  7.82   Sodium 135 - 145 mmol/L 139  139  141   Potassium 3.5 - 5.1 mmol/L 4.0  3.6  4.9   Chloride 98 - 111 mmol/L 107  111  105   CO2 22 - 32 mmol/L 26  23  27    Calcium 8.9 - 10.3 mg/dL 9.2  8.1  9.5   Total Protein 6.0 - 8.3 g/dL   6.5   Total Bilirubin 0.3 - 1.2 mg/dL   0.3   Alkaline Phos 39 - 117 U/L   79   AST 0 - 37 U/L   35   ALT 0 - 35 U/L   30     Lab Results  Component Value Date   WBC 5.4 04/19/2021   HGB 12.6 04/19/2021   HCT 38.8 04/19/2021   MCV 93.9 04/19/2021   PLT 284 04/19/2021   NEUTROABS 4.3 07/10/2011    ASSESSMENT & PLAN:  Malignant neoplasm of central portion of left breast in female, estrogen receptor positive (HCC) 06/12/2006: Left breast  cancer status post left lumpectomy T1CNX stage Ia grade 1 ER/PR positive HER2 negative Ki-67 3%, radiation, letrozole 03/23/2021: Right breast cancer T2N0 stage Ia grade 1 IDC ER/PR positive HER2 negative Ki-67 1% status post right lumpectomy stage Ib grade 2 IDC 0/5 lymph nodes, radiation, tamoxifen   Current treatment: Tamoxifen started 08/13/2021 Tamoxifen toxicities: Mild hot flashes, frequent urinary infections, leg cramps We reduced the dosage of tamoxifen to 10 mg a day 06/2022. Patient  has felt significantly better with the reduced dosage.  Sores on the gums: Self managed with home remedies and finally got better on its own.  It was felt not to be related to tamoxifen.   Breast cancer surveillance: 1.  Breast exam 07/05/2023: Benign 2. mammograms 04/01/2023: Benign breast density category C   Genetic testing:FH gene mutation [id number] Based on genetic counselors report, Ms. 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 This result does not impact her screening recommendations at this time.    Return to clinic in 1 year for follow-up    No orders of the defined types were placed in this encounter.  The patient has a good understanding of the overall plan. she agrees with it. she will call with any problems that may develop before the next visit here. Total time spent: 30 mins including face to face time and time spent for planning, charting and co-ordination of care   Tamsen Meek, MD 07/05/23

## 2023-08-30 DIAGNOSIS — J069 Acute upper respiratory infection, unspecified: Secondary | ICD-10-CM | POA: Diagnosis not present

## 2023-08-30 DIAGNOSIS — J029 Acute pharyngitis, unspecified: Secondary | ICD-10-CM | POA: Diagnosis not present

## 2023-11-24 ENCOUNTER — Other Ambulatory Visit: Payer: Self-pay

## 2023-11-24 ENCOUNTER — Inpatient Hospital Stay (HOSPITAL_COMMUNITY): Admission: EM | Admit: 2023-11-24 | Discharge: 2023-12-04 | DRG: 536 | Disposition: A | Discharge status: Home Health

## 2023-11-24 ENCOUNTER — Encounter (HOSPITAL_COMMUNITY): Payer: Self-pay

## 2023-11-24 ENCOUNTER — Emergency Department (HOSPITAL_COMMUNITY)

## 2023-11-24 DIAGNOSIS — Z9010 Acquired absence of unspecified breast and nipple: Secondary | ICD-10-CM | POA: Diagnosis not present

## 2023-11-24 DIAGNOSIS — T148XXA Other injury of unspecified body region, initial encounter: Secondary | ICD-10-CM

## 2023-11-24 DIAGNOSIS — Z7983 Long term (current) use of bisphosphonates: Secondary | ICD-10-CM | POA: Diagnosis not present

## 2023-11-24 DIAGNOSIS — M549 Dorsalgia, unspecified: Secondary | ICD-10-CM | POA: Diagnosis present

## 2023-11-24 DIAGNOSIS — S32810A Multiple fractures of pelvis with stable disruption of pelvic ring, initial encounter for closed fracture: Secondary | ICD-10-CM | POA: Diagnosis not present

## 2023-11-24 DIAGNOSIS — K219 Gastro-esophageal reflux disease without esophagitis: Secondary | ICD-10-CM | POA: Diagnosis present

## 2023-11-24 DIAGNOSIS — S2221XA Fracture of manubrium, initial encounter for closed fracture: Secondary | ICD-10-CM | POA: Diagnosis present

## 2023-11-24 DIAGNOSIS — Z853 Personal history of malignant neoplasm of breast: Secondary | ICD-10-CM

## 2023-11-24 DIAGNOSIS — Z7901 Long term (current) use of anticoagulants: Secondary | ICD-10-CM

## 2023-11-24 DIAGNOSIS — Z8051 Family history of malignant neoplasm of kidney: Secondary | ICD-10-CM

## 2023-11-24 DIAGNOSIS — Y92410 Unspecified street and highway as the place of occurrence of the external cause: Secondary | ICD-10-CM

## 2023-11-24 DIAGNOSIS — M25552 Pain in left hip: Secondary | ICD-10-CM | POA: Diagnosis present

## 2023-11-24 DIAGNOSIS — R9431 Abnormal electrocardiogram [ECG] [EKG]: Secondary | ICD-10-CM | POA: Diagnosis not present

## 2023-11-24 DIAGNOSIS — M1611 Unilateral primary osteoarthritis, right hip: Secondary | ICD-10-CM | POA: Diagnosis not present

## 2023-11-24 DIAGNOSIS — S32009A Unspecified fracture of unspecified lumbar vertebra, initial encounter for closed fracture: Secondary | ICD-10-CM

## 2023-11-24 DIAGNOSIS — Z79890 Hormone replacement therapy (postmenopausal): Secondary | ICD-10-CM | POA: Diagnosis not present

## 2023-11-24 DIAGNOSIS — S3210XA Unspecified fracture of sacrum, initial encounter for closed fracture: Secondary | ICD-10-CM | POA: Diagnosis not present

## 2023-11-24 DIAGNOSIS — S2232XA Fracture of one rib, left side, initial encounter for closed fracture: Secondary | ICD-10-CM | POA: Diagnosis not present

## 2023-11-24 DIAGNOSIS — S270XXA Traumatic pneumothorax, initial encounter: Secondary | ICD-10-CM | POA: Diagnosis not present

## 2023-11-24 DIAGNOSIS — Z96642 Presence of left artificial hip joint: Secondary | ICD-10-CM | POA: Diagnosis not present

## 2023-11-24 DIAGNOSIS — D500 Iron deficiency anemia secondary to blood loss (chronic): Secondary | ICD-10-CM | POA: Diagnosis not present

## 2023-11-24 DIAGNOSIS — S32591A Other specified fracture of right pubis, initial encounter for closed fracture: Secondary | ICD-10-CM | POA: Diagnosis not present

## 2023-11-24 DIAGNOSIS — S199XXA Unspecified injury of neck, initial encounter: Secondary | ICD-10-CM | POA: Diagnosis not present

## 2023-11-24 DIAGNOSIS — S0990XA Unspecified injury of head, initial encounter: Secondary | ICD-10-CM | POA: Diagnosis not present

## 2023-11-24 DIAGNOSIS — S3282XA Multiple fractures of pelvis without disruption of pelvic ring, initial encounter for closed fracture: Secondary | ICD-10-CM | POA: Diagnosis not present

## 2023-11-24 DIAGNOSIS — S52501A Unspecified fracture of the lower end of right radius, initial encounter for closed fracture: Secondary | ICD-10-CM | POA: Diagnosis present

## 2023-11-24 DIAGNOSIS — T1490XA Injury, unspecified, initial encounter: Secondary | ICD-10-CM | POA: Diagnosis present

## 2023-11-24 DIAGNOSIS — S32512A Fracture of superior rim of left pubis, initial encounter for closed fracture: Secondary | ICD-10-CM | POA: Diagnosis not present

## 2023-11-24 DIAGNOSIS — Z923 Personal history of irradiation: Secondary | ICD-10-CM | POA: Diagnosis not present

## 2023-11-24 DIAGNOSIS — S3993XA Unspecified injury of pelvis, initial encounter: Secondary | ICD-10-CM | POA: Diagnosis not present

## 2023-11-24 DIAGNOSIS — S32008A Other fracture of unspecified lumbar vertebra, initial encounter for closed fracture: Secondary | ICD-10-CM | POA: Diagnosis present

## 2023-11-24 DIAGNOSIS — I471 Supraventricular tachycardia, unspecified: Secondary | ICD-10-CM | POA: Diagnosis not present

## 2023-11-24 DIAGNOSIS — S32059A Unspecified fracture of fifth lumbar vertebra, initial encounter for closed fracture: Secondary | ICD-10-CM | POA: Diagnosis not present

## 2023-11-24 DIAGNOSIS — S32411A Displaced fracture of anterior wall of right acetabulum, initial encounter for closed fracture: Secondary | ICD-10-CM | POA: Diagnosis not present

## 2023-11-24 DIAGNOSIS — D62 Acute posthemorrhagic anemia: Secondary | ICD-10-CM | POA: Diagnosis not present

## 2023-11-24 DIAGNOSIS — N178 Other acute kidney failure: Secondary | ICD-10-CM | POA: Diagnosis not present

## 2023-11-24 DIAGNOSIS — M542 Cervicalgia: Secondary | ICD-10-CM | POA: Diagnosis not present

## 2023-11-24 DIAGNOSIS — M79601 Pain in right arm: Secondary | ICD-10-CM | POA: Diagnosis not present

## 2023-11-24 DIAGNOSIS — J939 Pneumothorax, unspecified: Secondary | ICD-10-CM | POA: Diagnosis not present

## 2023-11-24 DIAGNOSIS — I429 Cardiomyopathy, unspecified: Secondary | ICD-10-CM | POA: Diagnosis present

## 2023-11-24 DIAGNOSIS — R0902 Hypoxemia: Secondary | ICD-10-CM | POA: Diagnosis not present

## 2023-11-24 DIAGNOSIS — J9811 Atelectasis: Secondary | ICD-10-CM | POA: Diagnosis not present

## 2023-11-24 DIAGNOSIS — S32592A Other specified fracture of left pubis, initial encounter for closed fracture: Secondary | ICD-10-CM | POA: Diagnosis present

## 2023-11-24 DIAGNOSIS — S3289XA Fracture of other parts of pelvis, initial encounter for closed fracture: Secondary | ICD-10-CM | POA: Diagnosis not present

## 2023-11-24 DIAGNOSIS — J982 Interstitial emphysema: Secondary | ICD-10-CM | POA: Diagnosis not present

## 2023-11-24 DIAGNOSIS — M25551 Pain in right hip: Secondary | ICD-10-CM | POA: Diagnosis present

## 2023-11-24 DIAGNOSIS — M545 Low back pain, unspecified: Secondary | ICD-10-CM | POA: Diagnosis not present

## 2023-11-24 DIAGNOSIS — M129 Arthropathy, unspecified: Secondary | ICD-10-CM | POA: Diagnosis not present

## 2023-11-24 DIAGNOSIS — Z471 Aftercare following joint replacement surgery: Secondary | ICD-10-CM | POA: Diagnosis not present

## 2023-11-24 DIAGNOSIS — S32049A Unspecified fracture of fourth lumbar vertebra, initial encounter for closed fracture: Secondary | ICD-10-CM | POA: Diagnosis not present

## 2023-11-24 DIAGNOSIS — I4891 Unspecified atrial fibrillation: Secondary | ICD-10-CM | POA: Diagnosis not present

## 2023-11-24 DIAGNOSIS — Z8052 Family history of malignant neoplasm of bladder: Secondary | ICD-10-CM

## 2023-11-24 DIAGNOSIS — Z803 Family history of malignant neoplasm of breast: Secondary | ICD-10-CM | POA: Diagnosis not present

## 2023-11-24 DIAGNOSIS — S329XXA Fracture of unspecified parts of lumbosacral spine and pelvis, initial encounter for closed fracture: Secondary | ICD-10-CM

## 2023-11-24 DIAGNOSIS — R112 Nausea with vomiting, unspecified: Secondary | ICD-10-CM | POA: Diagnosis not present

## 2023-11-24 DIAGNOSIS — E039 Hypothyroidism, unspecified: Secondary | ICD-10-CM | POA: Diagnosis present

## 2023-11-24 DIAGNOSIS — S2242XA Multiple fractures of ribs, left side, initial encounter for closed fracture: Secondary | ICD-10-CM | POA: Diagnosis not present

## 2023-11-24 DIAGNOSIS — N179 Acute kidney failure, unspecified: Secondary | ICD-10-CM | POA: Diagnosis not present

## 2023-11-24 DIAGNOSIS — R413 Other amnesia: Secondary | ICD-10-CM | POA: Diagnosis not present

## 2023-11-24 DIAGNOSIS — I480 Paroxysmal atrial fibrillation: Secondary | ICD-10-CM | POA: Diagnosis not present

## 2023-11-24 DIAGNOSIS — I4819 Other persistent atrial fibrillation: Secondary | ICD-10-CM | POA: Diagnosis not present

## 2023-11-24 DIAGNOSIS — I10 Essential (primary) hypertension: Secondary | ICD-10-CM | POA: Diagnosis present

## 2023-11-24 DIAGNOSIS — J90 Pleural effusion, not elsewhere classified: Secondary | ICD-10-CM | POA: Diagnosis not present

## 2023-11-24 DIAGNOSIS — S2249XA Multiple fractures of ribs, unspecified side, initial encounter for closed fracture: Secondary | ICD-10-CM

## 2023-11-24 DIAGNOSIS — S32511A Fracture of superior rim of right pubis, initial encounter for closed fracture: Secondary | ICD-10-CM | POA: Diagnosis not present

## 2023-11-24 DIAGNOSIS — S299XXA Unspecified injury of thorax, initial encounter: Secondary | ICD-10-CM | POA: Diagnosis not present

## 2023-11-24 DIAGNOSIS — R9089 Other abnormal findings on diagnostic imaging of central nervous system: Secondary | ICD-10-CM | POA: Diagnosis not present

## 2023-11-24 LAB — COMPREHENSIVE METABOLIC PANEL WITH GFR
ALT: 52 U/L — ABNORMAL HIGH (ref 0–44)
AST: 80 U/L — ABNORMAL HIGH (ref 15–41)
Albumin: 3.4 g/dL — ABNORMAL LOW (ref 3.5–5.0)
Alkaline Phosphatase: 32 U/L — ABNORMAL LOW (ref 38–126)
Anion gap: 10 (ref 5–15)
BUN: 24 mg/dL — ABNORMAL HIGH (ref 8–23)
CO2: 19 mmol/L — ABNORMAL LOW (ref 22–32)
Calcium: 8.7 mg/dL — ABNORMAL LOW (ref 8.9–10.3)
Chloride: 111 mmol/L (ref 98–111)
Creatinine, Ser: 1 mg/dL (ref 0.44–1.00)
GFR, Estimated: 55 mL/min — ABNORMAL LOW (ref >60)
Glucose, Bld: 150 mg/dL — ABNORMAL HIGH (ref 70–99)
Potassium: 3.9 mmol/L (ref 3.5–5.1)
Sodium: 140 mmol/L (ref 135–145)
Total Bilirubin: 0.7 mg/dL (ref 0.0–1.2)
Total Protein: 6.4 g/dL — ABNORMAL LOW (ref 6.5–8.1)

## 2023-11-24 LAB — CBC
HCT: 38.1 % (ref 36.0–46.0)
Hemoglobin: 12.4 g/dL (ref 12.0–15.0)
MCH: 31.2 pg (ref 26.0–34.0)
MCHC: 32.5 g/dL (ref 30.0–36.0)
MCV: 96 fL (ref 80.0–100.0)
Platelets: 278 10*3/uL (ref 150–400)
RBC: 3.97 MIL/uL (ref 3.87–5.11)
RDW: 12.7 % (ref 11.5–15.5)
WBC: 19.9 10*3/uL — ABNORMAL HIGH (ref 4.0–10.5)
nRBC: 0 % (ref 0.0–0.2)

## 2023-11-24 LAB — I-STAT CHEM 8, ED
BUN: 25 mg/dL — ABNORMAL HIGH (ref 8–23)
Calcium, Ion: 1.11 mmol/L — ABNORMAL LOW (ref 1.15–1.40)
Chloride: 111 mmol/L (ref 98–111)
Creatinine, Ser: 1.1 mg/dL — ABNORMAL HIGH (ref 0.44–1.00)
Glucose, Bld: 146 mg/dL — ABNORMAL HIGH (ref 70–99)
HCT: 36 % (ref 36.0–46.0)
Hemoglobin: 12.2 g/dL (ref 12.0–15.0)
Potassium: 3.9 mmol/L (ref 3.5–5.1)
Sodium: 141 mmol/L (ref 135–145)
TCO2: 20 mmol/L — ABNORMAL LOW (ref 22–32)

## 2023-11-24 LAB — I-STAT CG4 LACTIC ACID, ED: Lactic Acid, Venous: 2.5 mmol/L (ref 0.5–1.9)

## 2023-11-24 LAB — ETHANOL: Alcohol, Ethyl (B): <10 mg/dL (ref <10)

## 2023-11-24 LAB — PROTIME-INR
INR: 1.1 (ref 0.8–1.2)
Prothrombin Time: 14 s (ref 11.4–15.2)

## 2023-11-24 MED ORDER — TRAMADOL HCL 50 MG PO TABS
25.0000 mg | ORAL_TABLET | Freq: Four times a day (QID) | ORAL | Status: DC | PRN
  Filled 2023-11-24: qty 1

## 2023-11-24 MED ORDER — HYDRALAZINE HCL 20 MG/ML IJ SOLN: 10.0000 mg | INTRAMUSCULAR | Status: DC | PRN

## 2023-11-24 MED ORDER — METOPROLOL TARTRATE 5 MG/5ML IV SOLN
5.0000 mg | Freq: Four times a day (QID) | INTRAVENOUS | Status: DC | PRN
  Filled 2023-11-24: qty 5

## 2023-11-24 MED ORDER — POLYETHYLENE GLYCOL 3350 17 G PO PACK: 17.0000 g | PACK | Freq: Every day | ORAL | Status: DC | PRN

## 2023-11-24 MED ORDER — ONDANSETRON 4 MG PO TBDP: 4.0000 mg | ORAL_TABLET | Freq: Four times a day (QID) | ORAL | Status: DC | PRN

## 2023-11-24 MED ORDER — LEVOTHYROXINE SODIUM 75 MCG PO TABS
75.0000 ug | ORAL_TABLET | Freq: Every day | ORAL | Status: DC
  Administered 2023-11-25 – 2023-12-03 (×9): 75 ug via ORAL
  Filled 2023-11-24 (×9): qty 1

## 2023-11-24 MED ORDER — LACTATED RINGERS IV SOLN: INTRAVENOUS | Status: AC

## 2023-11-24 MED ORDER — ACETAMINOPHEN 500 MG PO TABS
1000.0000 mg | ORAL_TABLET | Freq: Four times a day (QID) | ORAL | Status: DC
  Administered 2023-11-24 – 2023-12-04 (×36): 1000 mg via ORAL
  Filled 2023-11-24 (×38): qty 2

## 2023-11-24 MED ORDER — PANTOPRAZOLE SODIUM 40 MG PO TBEC
40.0000 mg | DELAYED_RELEASE_TABLET | Freq: Every day | ORAL | Status: DC
  Administered 2023-11-24 – 2023-12-04 (×9): 40 mg via ORAL
  Filled 2023-11-24 (×11): qty 1

## 2023-11-24 MED ORDER — METHOCARBAMOL 500 MG PO TABS
500.0000 mg | ORAL_TABLET | Freq: Three times a day (TID) | ORAL | Status: DC
  Administered 2023-11-24 – 2023-11-25 (×3): 500 mg via ORAL
  Filled 2023-11-24 (×3): qty 1

## 2023-11-24 MED ORDER — DOCUSATE SODIUM 100 MG PO CAPS
100.0000 mg | ORAL_CAPSULE | Freq: Two times a day (BID) | ORAL | Status: DC
  Administered 2023-11-24 – 2023-12-02 (×8): 100 mg via ORAL
  Filled 2023-11-24 (×14): qty 1

## 2023-11-24 MED ORDER — IBUPROFEN 400 MG PO TABS
600.0000 mg | ORAL_TABLET | Freq: Four times a day (QID) | ORAL | Status: DC
  Administered 2023-11-24 – 2023-11-25 (×4): 600 mg via ORAL
  Filled 2023-11-24: qty 2
  Filled 2023-11-24: qty 1
  Filled 2023-11-24: qty 2
  Filled 2023-11-24: qty 1

## 2023-11-24 MED ORDER — IOHEXOL 350 MG/ML SOLN
75.0000 mL | Freq: Once | INTRAVENOUS | Status: AC | PRN
  Administered 2023-11-24: 75 mL via INTRAVENOUS

## 2023-11-24 MED ORDER — FENTANYL CITRATE PF 50 MCG/ML IJ SOSY
50.0000 ug | PREFILLED_SYRINGE | Freq: Once | INTRAMUSCULAR | Status: AC
  Administered 2023-11-24: 50 ug via INTRAVENOUS
  Filled 2023-11-24: qty 1

## 2023-11-24 MED ORDER — ONDANSETRON HCL 4 MG/2ML IJ SOLN
4.0000 mg | Freq: Four times a day (QID) | INTRAMUSCULAR | Status: DC | PRN
  Administered 2023-11-26: 4 mg via INTRAVENOUS
  Filled 2023-11-24: qty 2

## 2023-11-24 MED ORDER — HYDROMORPHONE HCL 1 MG/ML IJ SOLN
1.0000 mg | INTRAMUSCULAR | Status: DC | PRN
  Administered 2023-11-25: 1 mg via INTRAVENOUS
  Filled 2023-11-24: qty 1

## 2023-11-24 MED ORDER — METHOCARBAMOL 1000 MG/10ML IJ SOLN: 500.0000 mg | Freq: Three times a day (TID) | INTRAMUSCULAR | Status: DC
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
## 2023-11-24 NOTE — ED Notes (Signed)
 [employer] at bedside conversing with pt. [employer] attempting to notify pt's daughter.

## 2023-11-24 NOTE — Progress Notes (Signed)
 Transition of Care [facility]) - CAGE-AID Screening   Patient Details  Name: Rebecca [name] MRN: [id number] Date of Birth: [birth year]  [facility] CM/SW Contact:    Brunetta Capes, RN Phone Number: 11/24/2023, 10:29 PM   Clinical Narrative:  Patient endorses some alcohol use, denies the use of illicit drugs, resources not given at this time.   CAGE-AID Screening:    Have You Ever Felt You Ought to Cut Down on Your Drinking or Drug Use?: No Have People Annoyed You By Critizing Your Drinking Or Drug Use?: No Have You Felt Bad Or Guilty About Your Drinking Or Drug Use?: No Have You Ever Had a Drink or Used Drugs First Thing In The Morning to Steady Your Nerves or to Get Rid of a Hangover?: No CAGE-AID Score: 0  Substance Abuse Education Offered: No

## 2023-11-24 NOTE — ED Notes (Signed)
 Pt's friend Rebecca [name] contacted after multiple attempts to notify family.
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
## 2023-11-24 NOTE — ED Notes (Signed)
 Portable chest/pelvis x-ray
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
## 2023-11-24 NOTE — ED Notes (Signed)
 Pt returned from CT scan. Chaplain at bedside.

## 2023-11-24 NOTE — ED Notes (Signed)
C-collar removed by Dr Tegeler 

## 2023-11-25 ENCOUNTER — Inpatient Hospital Stay (HOSPITAL_COMMUNITY)

## 2023-11-25 DIAGNOSIS — S32810A Multiple fractures of pelvis with stable disruption of pelvic ring, initial encounter for closed fracture: Secondary | ICD-10-CM

## 2023-11-25 LAB — CBC
HCT: 26.6 % — ABNORMAL LOW (ref 36.0–46.0)
HCT: 27.7 % — ABNORMAL LOW (ref 36.0–46.0)
Hemoglobin: 8.9 g/dL — ABNORMAL LOW (ref 12.0–15.0)
Hemoglobin: 9.3 g/dL — ABNORMAL LOW (ref 12.0–15.0)
MCH: 31.3 pg (ref 26.0–34.0)
MCH: 31.2 pg (ref 26.0–34.0)
MCHC: 33.5 g/dL (ref 30.0–36.0)
MCHC: 33.6 g/dL (ref 30.0–36.0)
MCV: 93.7 fL (ref 80.0–100.0)
MCV: 93 fL (ref 80.0–100.0)
Platelets: 152 10*3/uL (ref 150–400)
Platelets: 125 10*3/uL — ABNORMAL LOW (ref 150–400)
RBC: 2.84 MIL/uL — ABNORMAL LOW (ref 3.87–5.11)
RBC: 2.98 MIL/uL — ABNORMAL LOW (ref 3.87–5.11)
RDW: 12.9 % (ref 11.5–15.5)
RDW: 13.1 % (ref 11.5–15.5)
WBC: 13.5 10*3/uL — ABNORMAL HIGH (ref 4.0–10.5)
WBC: 12.1 10*3/uL — ABNORMAL HIGH (ref 4.0–10.5)
nRBC: 0 % (ref 0.0–0.2)
nRBC: 0 % (ref 0.0–0.2)

## 2023-11-25 LAB — BASIC METABOLIC PANEL WITH GFR
Anion gap: 8 (ref 5–15)
Anion gap: 7 (ref 5–15)
BUN: 36 mg/dL — ABNORMAL HIGH (ref 8–23)
BUN: 36 mg/dL — ABNORMAL HIGH (ref 8–23)
CO2: 19 mmol/L — ABNORMAL LOW (ref 22–32)
CO2: 21 mmol/L — ABNORMAL LOW (ref 22–32)
Calcium: 7.7 mg/dL — ABNORMAL LOW (ref 8.9–10.3)
Calcium: 8 mg/dL — ABNORMAL LOW (ref 8.9–10.3)
Chloride: 110 mmol/L (ref 98–111)
Chloride: 110 mmol/L (ref 98–111)
Creatinine, Ser: 1.72 mg/dL — ABNORMAL HIGH (ref 0.44–1.00)
Creatinine, Ser: 1.69 mg/dL — ABNORMAL HIGH (ref 0.44–1.00)
GFR, Estimated: 29 mL/min — ABNORMAL LOW (ref >60)
GFR, Estimated: 29 mL/min — ABNORMAL LOW (ref >60)
Glucose, Bld: 119 mg/dL — ABNORMAL HIGH (ref 70–99)
Glucose, Bld: 104 mg/dL — ABNORMAL HIGH (ref 70–99)
Potassium: 4.5 mmol/L (ref 3.5–5.1)
Potassium: 4.3 mmol/L (ref 3.5–5.1)
Sodium: 137 mmol/L (ref 135–145)
Sodium: 138 mmol/L (ref 135–145)

## 2023-11-25 LAB — URINALYSIS, ROUTINE W REFLEX MICROSCOPIC
Bilirubin Urine: NEGATIVE
Glucose, UA: NEGATIVE mg/dL
Ketones, ur: 5 mg/dL — AB
Leukocytes,Ua: NEGATIVE
Nitrite: NEGATIVE
Protein, ur: 30 mg/dL — AB
Specific Gravity, Urine: 1.042 — ABNORMAL HIGH (ref 1.005–1.030)
pH: 5 (ref 5.0–8.0)

## 2023-11-25 LAB — SAMPLE TO BLOOD BANK

## 2023-11-25 LAB — ABO/RH: ABO/RH(D): A POS

## 2023-11-25 MED ORDER — METHOCARBAMOL 500 MG PO TABS
1000.0000 mg | ORAL_TABLET | Freq: Three times a day (TID) | ORAL | Status: AC
  Administered 2023-11-25 – 2023-11-27 (×6): 1000 mg via ORAL
  Filled 2023-11-25 (×6): qty 2

## 2023-11-25 MED ORDER — METOPROLOL TARTRATE 5 MG/5ML IV SOLN
5.0000 mg | Freq: Once | INTRAVENOUS | Status: DC
  Filled 2023-11-25: qty 5

## 2023-11-25 MED ORDER — METHOCARBAMOL 1000 MG/10ML IJ SOLN: 1000.0000 mg | Freq: Three times a day (TID) | INTRAMUSCULAR | Status: DC
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
## 2023-11-25 NOTE — Progress Notes (Signed)
 Call returned. No new orders at this time. Cont. To monitor patient and vital signs. Tim RN aware.

## 2023-11-25 NOTE — Progress Notes (Signed)
 Text page Dr. Marny Sires about U.A. results and CBC results. Tim RN aware
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
## 2023-11-25 NOTE — Progress Notes (Signed)
 Patient has not vded since arrival to floor. She has tried but unable to. Bladder scan 605 ml Tim RN aware. Dr. Marny Sires paged and made aware. Call return and ordering Foley to be inserted.

## 2023-11-26 ENCOUNTER — Inpatient Hospital Stay (HOSPITAL_COMMUNITY)

## 2023-11-26 LAB — CBC
HCT: 26.8 % — ABNORMAL LOW (ref 36.0–46.0)
HCT: 26.6 % — ABNORMAL LOW (ref 36.0–46.0)
Hemoglobin: 8.6 g/dL — ABNORMAL LOW (ref 12.0–15.0)
Hemoglobin: 8.5 g/dL — ABNORMAL LOW (ref 12.0–15.0)
MCH: 31 pg (ref 26.0–34.0)
MCH: 30.6 pg (ref 26.0–34.0)
MCHC: 32.1 g/dL (ref 30.0–36.0)
MCHC: 32 g/dL (ref 30.0–36.0)
MCV: 96.8 fL (ref 80.0–100.0)
MCV: 95.7 fL (ref 80.0–100.0)
Platelets: 126 10*3/uL — ABNORMAL LOW (ref 150–400)
Platelets: 128 10*3/uL — ABNORMAL LOW (ref 150–400)
RBC: 2.77 MIL/uL — ABNORMAL LOW (ref 3.87–5.11)
RBC: 2.78 MIL/uL — ABNORMAL LOW (ref 3.87–5.11)
RDW: 13.2 % (ref 11.5–15.5)
RDW: 13.2 % (ref 11.5–15.5)
WBC: 10.9 10*3/uL — ABNORMAL HIGH (ref 4.0–10.5)
WBC: 12.2 10*3/uL — ABNORMAL HIGH (ref 4.0–10.5)
nRBC: 0 % (ref 0.0–0.2)
nRBC: 0 % (ref 0.0–0.2)

## 2023-11-26 LAB — RENAL FUNCTION PANEL
Albumin: 2.5 g/dL — ABNORMAL LOW (ref 3.5–5.0)
Anion gap: 6 (ref 5–15)
BUN: 35 mg/dL — ABNORMAL HIGH (ref 8–23)
CO2: 20 mmol/L — ABNORMAL LOW (ref 22–32)
Calcium: 7.9 mg/dL — ABNORMAL LOW (ref 8.9–10.3)
Chloride: 113 mmol/L — ABNORMAL HIGH (ref 98–111)
Creatinine, Ser: 1.81 mg/dL — ABNORMAL HIGH (ref 0.44–1.00)
GFR, Estimated: 27 mL/min — ABNORMAL LOW (ref >60)
Glucose, Bld: 125 mg/dL — ABNORMAL HIGH (ref 70–99)
Phosphorus: 4 mg/dL (ref 2.5–4.6)
Potassium: 4.6 mmol/L (ref 3.5–5.1)
Sodium: 139 mmol/L (ref 135–145)

## 2023-11-26 LAB — BASIC METABOLIC PANEL WITH GFR
Anion gap: 8 (ref 5–15)
BUN: 33 mg/dL — ABNORMAL HIGH (ref 8–23)
CO2: 19 mmol/L — ABNORMAL LOW (ref 22–32)
Calcium: 7.7 mg/dL — ABNORMAL LOW (ref 8.9–10.3)
Chloride: 113 mmol/L — ABNORMAL HIGH (ref 98–111)
Creatinine, Ser: 1.5 mg/dL — ABNORMAL HIGH (ref 0.44–1.00)
GFR, Estimated: 34 mL/min — ABNORMAL LOW (ref >60)
Glucose, Bld: 134 mg/dL — ABNORMAL HIGH (ref 70–99)
Potassium: 4.3 mmol/L (ref 3.5–5.1)
Sodium: 140 mmol/L (ref 135–145)

## 2023-11-26 LAB — MAGNESIUM: Magnesium: 2 mg/dL (ref 1.7–2.4)

## 2023-11-26 MED ORDER — METOPROLOL TARTRATE 12.5 MG HALF TABLET
12.5000 mg | ORAL_TABLET | Freq: Two times a day (BID) | ORAL | Status: DC
  Administered 2023-11-26 – 2023-11-27 (×3): 12.5 mg via ORAL
  Filled 2023-11-26 (×3): qty 1

## 2023-11-26 MED ORDER — FERROUS SULFATE 325 (65 FE) MG PO TABS
325.0000 mg | ORAL_TABLET | Freq: Three times a day (TID) | ORAL | Status: DC
  Administered 2023-11-26 – 2023-12-02 (×15): 325 mg via ORAL
  Filled 2023-11-26 (×21): qty 1

## 2023-11-26 MED ORDER — TRAMADOL HCL 50 MG PO TABS
25.0000 mg | ORAL_TABLET | ORAL | Status: DC | PRN
  Administered 2023-11-26 – 2023-11-29 (×7): 50 mg via ORAL
  Filled 2023-11-26 (×8): qty 1

## 2023-11-26 MED ORDER — DEXTROSE-SODIUM CHLORIDE 5-0.45 % IV SOLN: INTRAVENOUS | Status: AC

## 2023-11-26 MED ORDER — HYDROMORPHONE HCL 1 MG/ML IJ SOLN
0.5000 mg | Freq: Four times a day (QID) | INTRAMUSCULAR | Status: DC | PRN
  Administered 2023-11-28 – 2023-12-01 (×6): 0.5 mg via INTRAVENOUS
  Filled 2023-11-26 (×6): qty 0.5

## 2023-11-26 MED ORDER — VITAMIN C 500 MG PO TABS
500.0000 mg | ORAL_TABLET | Freq: Three times a day (TID) | ORAL | Status: DC
  Administered 2023-11-26 – 2023-12-04 (×25): 500 mg via ORAL
  Filled 2023-11-26 (×25): qty 1

## 2023-11-26 MED ORDER — LACTATED RINGERS IV BOLUS
500.0000 mL | Freq: Once | INTRAVENOUS | Status: AC
  Administered 2023-11-26: 500 mL via INTRAVENOUS

## 2023-11-26 NOTE — Progress Notes (Signed)
 Patient very anxious this am.  Sat and talked with patient for a while.  Patient is of Catholic faith, we discussed her fears this am and that she had called her priest to come anoint her.  I prayed with patient and offered her my personal rosary this am to provide her some comfort in this difficult time.  Patient was very relieved and thankful.  Provided supportive listening and encouragement.
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
## 2023-11-26 NOTE — Progress Notes (Signed)
 Orthopedic Progress Note  Patient mobilized with PT early today. Repeat pelvis films after mobilization do not show any interval displacement. Will continue with non-operative management. Can be weight bearing as tolerated. Okay for diet and dvt ppx from ortho perspective. Follow up with orthopedics in ~2 weeks.  Diedra Fowler, MD Orthopedic Surgeon
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
## 2023-11-26 NOTE — Progress Notes (Signed)
 Inpatient Rehab Admissions Coordinator Note:   Per therapy recommendations patient was screened for CIR candidacy by Mickey Alar, PT. At this time, pt appears to be a potential candidate for CIR. I will place an order for rehab consult for full assessment, per our protocol.  Please contact me any with questions.Loye Rumble, PT, DPT [phone] 11/26/23 2:24 PM

## 2023-11-26 NOTE — Progress Notes (Signed)
 Inpatient Rehab Admissions Coordinator:    I met with pt. To discuss potential CIR admit. Pt. Is interested and states that her daughter (daughter present) can provide 24/7 min assist at d/c. I will have rehab MD consult in the morning and send case to insurance once that is completed.   Wandalee Gust, MS, CCC-SLP Rehab Admissions Coordinator  [phone] (celll) [phone] (office)

## 2023-11-26 NOTE — Progress Notes (Signed)
 Patient in Atrial fibrillation RVR HR at 130s to 140s, confirmed with 12 leads EKG,pt is alert and oriented,asymptomatic,B/P 120/97 Dr Carson Clara made aware with orders made.Will continue to monitor.

## 2023-11-26 NOTE — Plan of Care (Signed)

## 2023-11-26 NOTE — Progress Notes (Signed)
 Patient converted to SR with PACs at this time.

## 2023-11-27 DIAGNOSIS — T1490XA Injury, unspecified, initial encounter: Secondary | ICD-10-CM | POA: Diagnosis not present

## 2023-11-27 DIAGNOSIS — I480 Paroxysmal atrial fibrillation: Secondary | ICD-10-CM

## 2023-11-27 DIAGNOSIS — D500 Iron deficiency anemia secondary to blood loss (chronic): Secondary | ICD-10-CM

## 2023-11-27 DIAGNOSIS — N179 Acute kidney failure, unspecified: Secondary | ICD-10-CM | POA: Diagnosis not present

## 2023-11-27 LAB — CBC
HCT: 24 % — ABNORMAL LOW (ref 36.0–46.0)
HCT: 27.9 % — ABNORMAL LOW (ref 36.0–46.0)
Hemoglobin: 7.5 g/dL — ABNORMAL LOW (ref 12.0–15.0)
Hemoglobin: 9.1 g/dL — ABNORMAL LOW (ref 12.0–15.0)
MCH: 30.4 pg (ref 26.0–34.0)
MCH: 30.3 pg (ref 26.0–34.0)
MCHC: 31.3 g/dL (ref 30.0–36.0)
MCHC: 32.6 g/dL (ref 30.0–36.0)
MCV: 97.2 fL (ref 80.0–100.0)
MCV: 93 fL (ref 80.0–100.0)
Platelets: 124 10*3/uL — ABNORMAL LOW (ref 150–400)
Platelets: 149 10*3/uL — ABNORMAL LOW (ref 150–400)
RBC: 2.47 MIL/uL — ABNORMAL LOW (ref 3.87–5.11)
RBC: 3 MIL/uL — ABNORMAL LOW (ref 3.87–5.11)
RDW: 13.2 % (ref 11.5–15.5)
RDW: 14.4 % (ref 11.5–15.5)
WBC: 8.8 10*3/uL (ref 4.0–10.5)
WBC: 10.4 10*3/uL (ref 4.0–10.5)
nRBC: 0 % (ref 0.0–0.2)
nRBC: 0 % (ref 0.0–0.2)

## 2023-11-27 LAB — BASIC METABOLIC PANEL WITH GFR
Anion gap: 7 (ref 5–15)
BUN: 28 mg/dL — ABNORMAL HIGH (ref 8–23)
CO2: 20 mmol/L — ABNORMAL LOW (ref 22–32)
Calcium: 7.6 mg/dL — ABNORMAL LOW (ref 8.9–10.3)
Chloride: 111 mmol/L (ref 98–111)
Creatinine, Ser: 1.17 mg/dL — ABNORMAL HIGH (ref 0.44–1.00)
GFR, Estimated: 46 mL/min — ABNORMAL LOW (ref >60)
Glucose, Bld: 97 mg/dL (ref 70–99)
Potassium: 4.1 mmol/L (ref 3.5–5.1)
Sodium: 138 mmol/L (ref 135–145)

## 2023-11-27 LAB — PREPARE RBC (CROSSMATCH)

## 2023-11-27 LAB — APTT: aPTT: 32 s (ref 24–36)

## 2023-11-27 LAB — PROTIME-INR
INR: 1.1 (ref 0.8–1.2)
Prothrombin Time: 14.3 s (ref 11.4–15.2)

## 2023-11-27 MED ORDER — HEPARIN (PORCINE) 25000 UT/250ML-% IV SOLN
1050.0000 [IU]/h | INTRAVENOUS | Status: DC
  Administered 2023-11-27: 800 [IU]/h via INTRAVENOUS
  Filled 2023-11-27 (×2): qty 250

## 2023-11-27 MED ORDER — AMIODARONE LOAD VIA INFUSION
150.0000 mg | Freq: Once | INTRAVENOUS | Status: AC
  Administered 2023-11-27: 150 mg via INTRAVENOUS
  Filled 2023-11-27: qty 83.34

## 2023-11-27 MED ORDER — AMIODARONE HCL IN DEXTROSE 360-4.14 MG/200ML-% IV SOLN
60.0000 mg/h | INTRAVENOUS | Status: DC
  Administered 2023-11-27 (×2): 60 mg/h via INTRAVENOUS
  Filled 2023-11-27: qty 200

## 2023-11-27 MED ORDER — SODIUM CHLORIDE 0.9% IV SOLUTION: Freq: Once | INTRAVENOUS | Status: AC

## 2023-11-27 MED ORDER — AMIODARONE HCL IN DEXTROSE 360-4.14 MG/200ML-% IV SOLN
30.0000 mg/h | INTRAVENOUS | Status: DC
  Administered 2023-11-27 – 2023-11-29 (×4): 30 mg/h via INTRAVENOUS
  Filled 2023-11-27 (×5): qty 200

## 2023-11-27 NOTE — Plan of Care (Signed)

## 2023-11-27 NOTE — Progress Notes (Signed)
 Inpatient Rehab Admissions Coordinator:    CIR following. Note Pt. Remains on amio gtt for a fib, will wait to send case to insurance until more medically stable.  Wandalee Gust, MS, CCC-SLP Rehab Admissions Coordinator  [phone] (celll) [phone] (office)
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
## 2023-11-27 NOTE — Progress Notes (Addendum)
 Patient was on Afib/ RVR, early in the morning, HR went up to 130s, Amio drip started as per the order, patient converted to NSR around 1530 pm.  Patient's blood pressure was on soft side, complaint of dizziness and lightheadedness while getting up with OT, received 1 unit of PRBC, blood pressure has improved, denies any symptoms.
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
## 2023-11-27 NOTE — Progress Notes (Signed)
 SVT, suspect recurrent AF. Obtain EKG, give 1u pRBC, planned for lopressor, but BPs soft, so will give amio.  Rebecca Bamberg, MD General and Trauma Surgery [facility] Surgery
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
## 2023-11-27 NOTE — Progress Notes (Signed)
 Pt has history of s/p mastectomy, verified with PA [name]. To start a new IV access for blood transfusion on her left hand.  Needs to restrict venipuncture on her right hand as per her as per the PA.
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
## 2023-11-28 ENCOUNTER — Other Ambulatory Visit (HOSPITAL_COMMUNITY): Payer: Self-pay

## 2023-11-28 ENCOUNTER — Telehealth (HOSPITAL_COMMUNITY): Payer: Self-pay | Admitting: Pharmacy Technician

## 2023-11-28 ENCOUNTER — Inpatient Hospital Stay (HOSPITAL_COMMUNITY)

## 2023-11-28 DIAGNOSIS — D500 Iron deficiency anemia secondary to blood loss (chronic): Secondary | ICD-10-CM | POA: Diagnosis not present

## 2023-11-28 DIAGNOSIS — I480 Paroxysmal atrial fibrillation: Secondary | ICD-10-CM | POA: Diagnosis not present

## 2023-11-28 DIAGNOSIS — R9431 Abnormal electrocardiogram [ECG] [EKG]: Secondary | ICD-10-CM | POA: Diagnosis not present

## 2023-11-28 DIAGNOSIS — N179 Acute kidney failure, unspecified: Secondary | ICD-10-CM | POA: Diagnosis not present

## 2023-11-28 LAB — ECHOCARDIOGRAM COMPLETE
Area-P 1/2: 3.53 cm2
Calc EF: 52.9 %
S' Lateral: 2.3 cm
Single Plane A2C EF: 36.5 %
Single Plane A4C EF: 69.5 %

## 2023-11-28 LAB — HEPARIN LEVEL (UNFRACTIONATED)
Heparin Unfractionated: 0.21 [IU]/mL — ABNORMAL LOW (ref 0.30–0.70)
Heparin Unfractionated: 0.31 [IU]/mL (ref 0.30–0.70)
Heparin Unfractionated: 0.25 [IU]/mL — ABNORMAL LOW (ref 0.30–0.70)

## 2023-11-28 LAB — TYPE AND SCREEN
ABO/RH(D): A POS
Antibody Screen: NEGATIVE
Unit division: 0

## 2023-11-28 LAB — CBC
HCT: 28 % — ABNORMAL LOW (ref 36.0–46.0)
Hemoglobin: 9.5 g/dL — ABNORMAL LOW (ref 12.0–15.0)
MCH: 31 pg (ref 26.0–34.0)
MCHC: 33.9 g/dL (ref 30.0–36.0)
MCV: 91.5 fL (ref 80.0–100.0)
Platelets: 168 10*3/uL (ref 150–400)
RBC: 3.06 MIL/uL — ABNORMAL LOW (ref 3.87–5.11)
RDW: 14.6 % (ref 11.5–15.5)
WBC: 9.9 10*3/uL (ref 4.0–10.5)
nRBC: 0 % (ref 0.0–0.2)

## 2023-11-28 LAB — BASIC METABOLIC PANEL WITH GFR
Anion gap: 7 (ref 5–15)
BUN: 27 mg/dL — ABNORMAL HIGH (ref 8–23)
CO2: 21 mmol/L — ABNORMAL LOW (ref 22–32)
Calcium: 7.8 mg/dL — ABNORMAL LOW (ref 8.9–10.3)
Chloride: 110 mmol/L (ref 98–111)
Creatinine, Ser: 1.25 mg/dL — ABNORMAL HIGH (ref 0.44–1.00)
GFR, Estimated: 42 mL/min — ABNORMAL LOW (ref >60)
Glucose, Bld: 105 mg/dL — ABNORMAL HIGH (ref 70–99)
Potassium: 4.2 mmol/L (ref 3.5–5.1)
Sodium: 138 mmol/L (ref 135–145)

## 2023-11-28 LAB — BPAM RBC
Blood Product Expiration Date: 202505142359
ISSUE DATE / TIME: 202504161345
Unit Type and Rh: 6200

## 2023-11-28 LAB — TSH: TSH: 5.224 u[IU]/mL — ABNORMAL HIGH (ref 0.350–4.500)

## 2023-11-28 MED ORDER — METOPROLOL TARTRATE 12.5 MG HALF TABLET
12.5000 mg | ORAL_TABLET | Freq: Two times a day (BID) | ORAL | Status: DC
  Administered 2023-11-28 – 2023-12-04 (×13): 12.5 mg via ORAL
  Filled 2023-11-28 (×13): qty 1

## 2023-11-28 MED ORDER — MELATONIN 3 MG PO TABS
3.0000 mg | ORAL_TABLET | Freq: Every day | ORAL | Status: DC
  Administered 2023-11-28 – 2023-12-03 (×6): 3 mg via ORAL
  Filled 2023-11-28 (×7): qty 1
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
## 2023-11-28 NOTE — Telephone Encounter (Signed)
 Patient Product/process development scientist completed.    The patient is insured through [employer]. Patient has Medicare and is not eligible for a copay card, but may be able to apply for patient assistance or Medicare RX Payment Plan (Patient Must reach out to their plan, if eligible for payment plan), if available.    Ran test claim for Eliquis 5 mg and the current 30 day co-pay is $40.00.  Ran test claim for Xarelto 20 mg and the current 30 day co-pay is $40.00.  This test claim was processed through [facility]- copay amounts may vary at other pharmacies due to pharmacy/plan contracts, or as the patient moves through the different stages of their insurance plan.     Roland Earl, CPHT Pharmacy Technician III Certified Patient Advocate [facility] Pharmacy Patient Advocate Team Direct Number: [phone]  Fax: [phone]
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
## 2023-11-28 NOTE — Plan of Care (Signed)
  Problem: Clinical Measurements: Goal: Will remain free from infection Outcome: Progressing   Problem: Clinical Measurements: Goal: Diagnostic test results will improve Outcome: Progressing   Problem: Clinical Measurements: Goal: Respiratory complications will improve Outcome: Progressing   Problem: Nutrition: Goal: Adequate nutrition will be maintained Outcome: Progressing   Problem: Coping: Goal: Level of anxiety will decrease Outcome: Progressing   Problem: Pain Managment: Goal: General experience of comfort will improve and/or be controlled Outcome: Progressing   Problem: Safety: Goal: Ability to remain free from injury will improve Outcome: Progressing   Problem: Skin Integrity: Goal: Risk for impaired skin integrity will decrease Outcome: Progressing
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
## 2023-11-28 NOTE — Progress Notes (Signed)
  Echocardiogram 2D Echocardiogram has been performed.  Rebecca [name] 11/28/2023, 11:02 AM

## 2023-11-29 ENCOUNTER — Other Ambulatory Visit: Payer: Self-pay | Admitting: Physician Assistant

## 2023-11-29 DIAGNOSIS — I4891 Unspecified atrial fibrillation: Secondary | ICD-10-CM

## 2023-11-29 DIAGNOSIS — I480 Paroxysmal atrial fibrillation: Secondary | ICD-10-CM | POA: Diagnosis not present

## 2023-11-29 DIAGNOSIS — N179 Acute kidney failure, unspecified: Secondary | ICD-10-CM | POA: Diagnosis not present

## 2023-11-29 DIAGNOSIS — D500 Iron deficiency anemia secondary to blood loss (chronic): Secondary | ICD-10-CM | POA: Diagnosis not present

## 2023-11-29 LAB — CBC
HCT: 28.4 % — ABNORMAL LOW (ref 36.0–46.0)
Hemoglobin: 9.3 g/dL — ABNORMAL LOW (ref 12.0–15.0)
MCH: 30.3 pg (ref 26.0–34.0)
MCHC: 32.7 g/dL (ref 30.0–36.0)
MCV: 92.5 fL (ref 80.0–100.0)
Platelets: 198 10*3/uL (ref 150–400)
RBC: 3.07 MIL/uL — ABNORMAL LOW (ref 3.87–5.11)
RDW: 14.4 % (ref 11.5–15.5)
WBC: 9.2 10*3/uL (ref 4.0–10.5)
nRBC: 0 % (ref 0.0–0.2)

## 2023-11-29 LAB — BASIC METABOLIC PANEL WITH GFR
Anion gap: 8 (ref 5–15)
BUN: 26 mg/dL — ABNORMAL HIGH (ref 8–23)
CO2: 22 mmol/L (ref 22–32)
Calcium: 7.9 mg/dL — ABNORMAL LOW (ref 8.9–10.3)
Chloride: 109 mmol/L (ref 98–111)
Creatinine, Ser: 0.99 mg/dL (ref 0.44–1.00)
GFR, Estimated: 56 mL/min — ABNORMAL LOW (ref >60)
Glucose, Bld: 106 mg/dL — ABNORMAL HIGH (ref 70–99)
Potassium: 4.4 mmol/L (ref 3.5–5.1)
Sodium: 139 mmol/L (ref 135–145)

## 2023-11-29 LAB — HEPARIN LEVEL (UNFRACTIONATED): Heparin Unfractionated: 0.45 [IU]/mL (ref 0.30–0.70)

## 2023-11-29 MED ORDER — METHOCARBAMOL 500 MG PO TABS
500.0000 mg | ORAL_TABLET | Freq: Three times a day (TID) | ORAL | Status: DC
  Administered 2023-11-29 – 2023-12-04 (×16): 500 mg via ORAL
  Filled 2023-11-29 (×16): qty 1

## 2023-11-29 MED ORDER — ENSURE ENLIVE PO LIQD
237.0000 mL | Freq: Three times a day (TID) | ORAL | Status: DC
  Administered 2023-11-29 – 2023-12-04 (×9): 237 mL via ORAL

## 2023-11-29 MED ORDER — APIXABAN 2.5 MG PO TABS
2.5000 mg | ORAL_TABLET | Freq: Two times a day (BID) | ORAL | Status: DC
  Administered 2023-11-29 – 2023-12-04 (×11): 2.5 mg via ORAL
  Filled 2023-11-29 (×11): qty 1

## 2023-11-29 MED ORDER — TRAMADOL HCL 50 MG PO TABS
50.0000 mg | ORAL_TABLET | Freq: Four times a day (QID) | ORAL | Status: DC
  Administered 2023-11-29 – 2023-12-02 (×11): 50 mg via ORAL
  Filled 2023-11-29 (×11): qty 1
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
## 2023-11-29 NOTE — Progress Notes (Signed)
 IP rehab admissions - Noted patient is now off Amio drip.  Patient up on bedside commode with assist.  We will open the case with insurance carrier today to request acute inpatient rehab admission.  I will update all once we have a determination from insurance case manager.  [phone]
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
## 2023-11-29 NOTE — Progress Notes (Addendum)
   Patient Name: Rebecca [name] Date of Encounter: 11/29/2023 [facility] Cardiologist: Luana Rumple, MD   Interval Summary  .    No new atrial fibrillation Echo shows normal LV function and no serious valve abnormalities, but moderate biatrial dilation.  Vital Signs .    Vitals:   11/28/23 2000 11/28/23 2305 11/29/23 0513 11/29/23 0748  BP: (!) 121/50 112/62 118/60 (!) 140/54  Pulse: 67 66 63 63  Resp: 19 20 13    Temp:  97.9 F (36.6 C) 97.8 F (36.6 C) 97.9 F (36.6 C)  TempSrc:  Oral Oral Oral  SpO2: 90% 92% 100% 98%  Weight:      Height:        Intake/Output Summary (Last 24 hours) at 11/29/2023 0850 Last data filed at 11/29/2023 0600 Gross per 24 hour  Intake 1179.52 ml  Output 1350 ml  Net -170.48 ml      11/24/2023    8:43 PM 11/24/2023    9:27 AM 07/05/2023    8:21 AM  Last 3 Weights  Weight (lbs) 123 lb 7.3 oz 136 lb 134 lb 9.6 oz  Weight (kg) 56 kg 61.689 kg 61.054 kg      Telemetry/ECG    NSR - Personally Reviewed  Physical Exam .   GEN: No acute distress.   Neck: No JVD Cardiac: RRR, no murmurs, rubs, or gallops.  Respiratory: Clear to auscultation bilaterally. GI: Soft, nontender, non-distended  MS: No edema  Assessment & Plan .     High likelihood that the atrial fibrillation is not just related to the current trauma, but may have occurred before, as an asymptomatic arrhythmia and likely to happen again in the future. Recommend chronic anticoagulation with Eliquis  2.5 mg twice daily (dose adjusted for age and small body size), start when recommended by the trauma team. No reason to treat with chronic antiarrhythmics, since the arrhythmia is asymptomatic. Should be on rate control meds (beta blockers). Outpatient arrhythmia monitor to gauge arrhythmia burden and success of rate control.  [facility] will sign off.   Medication Recommendations:   Eliquis  2.5 mg twice daily (start at DC or when OK w trauma  service) Metoprolol  12.5 mg twice daily Other recommendations (labs, testing, etc):  OP 30 day monitor (we will arrange, but needs to be done after she is discharged from inpatient rehab) Follow up as an outpatient:  2 months (will arrange f/u)  For questions or updates, please contact [facility] Please consult [email or web address] for contact info under        Signed, Luana Rumple, MD
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
## 2023-11-29 NOTE — Plan of Care (Signed)
  Problem: Education: Goal: Knowledge of General Education information will improve Description: Including pain rating scale, medication(s)/side effects and non-pharmacologic comfort measures Outcome: Progressing   Problem: Activity: Goal: Risk for a

## 2023-11-29 NOTE — TOC Progression Note (Signed)
 Transition of Care [facility]) - Progression Note    Patient Details  Name: Rebecca [name] MRN: [id number] Date of Birth: 04-06-1941  [facility] of [facility] CM/SW Contact  Sukanya Goldblatt M, RN Phone Number: 11/29/2023, 2:12 PM  Clinical Narrative:    Noted patient off amio drip; rehab [facility] has opened case with insurance.  Will follow for updates as available.    Expected Discharge Plan: IP Rehab Facility Barriers to Discharge: Continued Medical Work up  Expected Discharge Plan and Services   Discharge Planning Services: CM Consult   Living arrangements for the past 2 months: Single Family Home                                       Social Determinants of Health (SDOH) Interventions SDOH Screenings   Food Insecurity: No Food Insecurity (11/25/2023)  Housing: Unknown (11/25/2023)  Transportation Needs: No Transportation Needs (11/27/2023)  Utilities: Not At Risk (11/27/2023)  Social Connections: Moderately Integrated (11/27/2023)  Tobacco Use: Low Risk  (11/24/2023)    Readmission Risk Interventions     No data to display         Calla Catchings, RN, BSN  Trauma/Neuro ICU Case Manager [phone]

## 2023-11-29 NOTE — Discharge Instructions (Signed)

## 2023-11-29 NOTE — Progress Notes (Signed)
 Ordered 30 day monitor for Afib  Dr. Alvis Ba to read  To be sent in 3 weeks after discharge from CIR
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
## 2023-11-30 LAB — CBC
HCT: 30.5 % — ABNORMAL LOW (ref 36.0–46.0)
Hemoglobin: 10 g/dL — ABNORMAL LOW (ref 12.0–15.0)
MCH: 30.6 pg (ref 26.0–34.0)
MCHC: 32.8 g/dL (ref 30.0–36.0)
MCV: 93.3 fL (ref 80.0–100.0)
Platelets: 246 10*3/uL (ref 150–400)
RBC: 3.27 MIL/uL — ABNORMAL LOW (ref 3.87–5.11)
RDW: 14.1 % (ref 11.5–15.5)
WBC: 8.8 10*3/uL (ref 4.0–10.5)
nRBC: 0 % (ref 0.0–0.2)

## 2023-11-30 LAB — BASIC METABOLIC PANEL WITH GFR
Anion gap: 8 (ref 5–15)
BUN: 24 mg/dL — ABNORMAL HIGH (ref 8–23)
CO2: 20 mmol/L — ABNORMAL LOW (ref 22–32)
Calcium: 8.1 mg/dL — ABNORMAL LOW (ref 8.9–10.3)
Chloride: 111 mmol/L (ref 98–111)
Creatinine, Ser: 0.93 mg/dL (ref 0.44–1.00)
GFR, Estimated: >60 mL/min (ref >60)
Glucose, Bld: 105 mg/dL — ABNORMAL HIGH (ref 70–99)
Potassium: 3.8 mmol/L (ref 3.5–5.1)
Sodium: 139 mmol/L (ref 135–145)

## 2023-11-30 NOTE — Plan of Care (Signed)
   Problem: Health Behavior/Discharge Planning: Goal: Ability to manage health-related needs will improve Outcome: Progressing   Problem: Clinical Measurements: Goal: Will remain free from infection Outcome: Progressing

## 2023-11-30 NOTE — Plan of Care (Signed)
   Problem: Education: Goal: Knowledge of General Education information will improve Description: Including pain rating scale, medication(s)/side effects and non-pharmacologic comfort measures Outcome: Progressing   Problem: Nutrition: Goal: Adequate nutrition will be maintained Outcome: Progressing
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
## 2023-12-01 MED ORDER — VITAMIN D (ERGOCALCIFEROL) 1.25 MG (50000 UNIT) PO CAPS: 50000.0000 [IU] | ORAL_CAPSULE | ORAL | Status: DC

## 2023-12-01 MED ORDER — VITAMIN D 25 MCG (1000 UNIT) PO TABS
1000.0000 [IU] | ORAL_TABLET | Freq: Every day | ORAL | Status: DC
  Administered 2023-12-01 – 2023-12-04 (×4): 1000 [IU] via ORAL
  Filled 2023-12-01 (×4): qty 1
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
## 2023-12-02 MED ORDER — POLYETHYLENE GLYCOL 3350 17 G PO PACK
17.0000 g | PACK | Freq: Two times a day (BID) | ORAL | Status: DC
  Administered 2023-12-02: 17 g via ORAL
  Filled 2023-12-02 (×4): qty 1

## 2023-12-02 MED ORDER — TRAMADOL HCL 50 MG PO TABS
50.0000 mg | ORAL_TABLET | Freq: Four times a day (QID) | ORAL | Status: DC | PRN
  Administered 2023-12-02 (×2): 50 mg via ORAL
  Administered 2023-12-03 – 2023-12-04 (×5): 100 mg via ORAL
  Filled 2023-12-02: qty 2
  Filled 2023-12-02: qty 1
  Filled 2023-12-02 (×2): qty 2
  Filled 2023-12-02: qty 1
  Filled 2023-12-02 (×3): qty 2
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
## 2023-12-02 NOTE — Progress Notes (Signed)
 Inpatient Rehab Admissions Coordinator:    Insurance denied CIR. Pt. May want to appeal. Wants to talk to family and try walking with PT before deciding.   Wandalee Gust, MS, CCC-SLP Rehab Admissions Coordinator  [phone] (celll) [phone] (office)
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
## 2023-12-02 NOTE — Progress Notes (Signed)
 Inpatient Rehab Admissions Coordinator:    CIR following, continue to await insurance auth.  Megan Salon, MS, CCC-SLP Rehab Admissions Coordinator  [phone] (celll) [phone] (office)

## 2023-12-03 ENCOUNTER — Inpatient Hospital Stay (HOSPITAL_COMMUNITY)

## 2023-12-03 LAB — CBC
HCT: 36.4 % (ref 36.0–46.0)
Hemoglobin: 11.7 g/dL — ABNORMAL LOW (ref 12.0–15.0)
MCH: 30.2 pg (ref 26.0–34.0)
MCHC: 32.1 g/dL (ref 30.0–36.0)
MCV: 93.8 fL (ref 80.0–100.0)
Platelets: 542 10*3/uL — ABNORMAL HIGH (ref 150–400)
RBC: 3.88 MIL/uL (ref 3.87–5.11)
RDW: 14.3 % (ref 11.5–15.5)
WBC: 13.9 10*3/uL — ABNORMAL HIGH (ref 4.0–10.5)
nRBC: 0 % (ref 0.0–0.2)

## 2023-12-03 LAB — BASIC METABOLIC PANEL WITH GFR
Anion gap: 13 (ref 5–15)
BUN: 21 mg/dL (ref 8–23)
CO2: 19 mmol/L — ABNORMAL LOW (ref 22–32)
Calcium: 9 mg/dL (ref 8.9–10.3)
Chloride: 107 mmol/L (ref 98–111)
Creatinine, Ser: 1.02 mg/dL — ABNORMAL HIGH (ref 0.44–1.00)
GFR, Estimated: 54 mL/min — ABNORMAL LOW (ref >60)
Glucose, Bld: 100 mg/dL — ABNORMAL HIGH (ref 70–99)
Potassium: 4.2 mmol/L (ref 3.5–5.1)
Sodium: 139 mmol/L (ref 135–145)

## 2023-12-03 MED ORDER — SODIUM CHLORIDE 0.9 % IV BOLUS
250.0000 mL | Freq: Once | INTRAVENOUS | Status: AC
  Administered 2023-12-03: 250 mL via INTRAVENOUS

## 2023-12-03 MED ORDER — TAMOXIFEN CITRATE 10 MG PO TABS
10.0000 mg | ORAL_TABLET | Freq: Every day | ORAL | Status: DC
  Administered 2023-12-03 – 2023-12-04 (×2): 10 mg via ORAL
  Filled 2023-12-03 (×2): qty 1
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
## 2023-12-03 NOTE — TOC CM/SW Note (Signed)
Patient is confined to one room and is unable to ambulate to the bathroom, therefore needing a commode at the bedside.    Reinaldo Raddle, RN, BSN  Trauma/Neuro ICU Case Manager [phone]
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
## 2023-12-03 NOTE — TOC Progression Note (Signed)
 Transition of Care [facility]) - Progression Note    Patient Details  Name: ESTREYA CLAY MRN: [id number] Date of Birth: 09/09/1940  [facility] CM/SW Contact  Litzi Binning E Kassondra Geil, LCSW Phone Number: 12/03/2023, 12:10 PM  Clinical Narrative:    Met with patient at bedside. Patient to likely DC home tomorrow per PA. Patient wants to go home with home health. Patient wants a 3in1, does not want a tub bench. Patient declined agency preferences. Referral made to [facility] with [employer] for 3in1. Referral made to [facility] with [facility] for [facility].   Expected Discharge Plan: IP Rehab Facility Barriers to Discharge: Continued Medical Work up  Expected Discharge Plan and Services   Discharge Planning Services: CM Consult   Living arrangements for the past 2 months: Single Family Home                                       Social Determinants of Health (SDOH) Interventions SDOH Screenings   Food Insecurity: No Food Insecurity (11/25/2023)  Housing: Unknown (11/25/2023)  Transportation Needs: No Transportation Needs (11/27/2023)  Utilities: Not At Risk (11/27/2023)  Social Connections: Moderately Integrated (11/27/2023)  Tobacco Use: Low Risk  (11/24/2023)    Readmission Risk Interventions     No data to display

## 2023-12-03 NOTE — Progress Notes (Signed)
 Inpatient Rehab Admissions Coordinator:    Pt. Wishes to go home and does not want to appeal denial for CIR. Prefers to d/c home Wednesday.  Wandalee Gust, MS, CCC-SLP Rehab Admissions Coordinator  [phone] (celll) [phone] (office)
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
## 2023-12-04 ENCOUNTER — Ambulatory Visit: Admitting: Orthopaedic Surgery

## 2023-12-04 ENCOUNTER — Other Ambulatory Visit (HOSPITAL_COMMUNITY): Payer: Self-pay

## 2023-12-04 MED ORDER — ASCORBIC ACID 500 MG PO TABS
500.0000 mg | ORAL_TABLET | Freq: Three times a day (TID) | ORAL | 0 refills | Status: DC
  Filled 2023-12-04: qty 90, 30d supply, fill #0

## 2023-12-04 MED ORDER — DOCUSATE SODIUM 100 MG PO CAPS
100.0000 mg | ORAL_CAPSULE | Freq: Two times a day (BID) | ORAL | 0 refills | Status: DC
  Filled 2023-12-04: qty 10, 5d supply, fill #0

## 2023-12-04 MED ORDER — POLYETHYLENE GLYCOL 3350 17 G PO PACK: 17.0000 g | PACK | Freq: Two times a day (BID) | ORAL | Status: DC | PRN

## 2023-12-04 MED ORDER — MELATONIN 3 MG PO TABS: 3.0000 mg | ORAL_TABLET | Freq: Every evening | ORAL | Status: DC | PRN

## 2023-12-04 MED ORDER — TRAMADOL HCL 50 MG PO TABS
100.0000 mg | ORAL_TABLET | Freq: Four times a day (QID) | ORAL | 0 refills | Status: AC | PRN
  Filled 2023-12-04: qty 30, 4d supply, fill #0

## 2023-12-04 MED ORDER — APIXABAN 2.5 MG PO TABS
2.5000 mg | ORAL_TABLET | Freq: Two times a day (BID) | ORAL | 0 refills | Status: AC
Start: 2023-12-04 — End: ?
  Filled 2023-12-04: qty 60, 30d supply, fill #0

## 2023-12-04 MED ORDER — METHOCARBAMOL 500 MG PO TABS
500.0000 mg | ORAL_TABLET | Freq: Three times a day (TID) | ORAL | 0 refills | Status: DC | PRN
  Filled 2023-12-04: qty 60, 20d supply, fill #0

## 2023-12-04 MED ORDER — METOPROLOL TARTRATE 25 MG PO TABS
12.5000 mg | ORAL_TABLET | Freq: Two times a day (BID) | ORAL | 0 refills | Status: DC
  Filled 2023-12-04: qty 30, 30d supply, fill #0

## 2023-12-04 MED ORDER — ACETAMINOPHEN 500 MG PO TABS
1000.0000 mg | ORAL_TABLET | Freq: Four times a day (QID) | ORAL | 0 refills | Status: AC
  Filled 2023-12-04: qty 30, 4d supply, fill #0
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
## 2023-12-04 NOTE — Care Management Important Message (Signed)
 Important Message  Patient Details  Name: Rebecca [name] MRN: [id number] Date of Birth: 12-05-1940   Important Message Given:  Yes - Medicare IM     Janith Melnick 12/04/2023, 9:42 AM
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
## 2023-12-05 ENCOUNTER — Encounter: Payer: Self-pay | Admitting: *Deleted

## 2023-12-05 DIAGNOSIS — R2681 Unsteadiness on feet: Secondary | ICD-10-CM | POA: Diagnosis not present

## 2023-12-07 DIAGNOSIS — S2242XD Multiple fractures of ribs, left side, subsequent encounter for fracture with routine healing: Secondary | ICD-10-CM | POA: Diagnosis not present

## 2023-12-07 DIAGNOSIS — S32059D Unspecified fracture of fifth lumbar vertebra, subsequent encounter for fracture with routine healing: Secondary | ICD-10-CM | POA: Diagnosis not present

## 2023-12-07 DIAGNOSIS — S32592D Other specified fracture of left pubis, subsequent encounter for fracture with routine healing: Secondary | ICD-10-CM | POA: Diagnosis not present

## 2023-12-07 DIAGNOSIS — I119 Hypertensive heart disease without heart failure: Secondary | ICD-10-CM | POA: Diagnosis not present

## 2023-12-07 DIAGNOSIS — S32591D Other specified fracture of right pubis, subsequent encounter for fracture with routine healing: Secondary | ICD-10-CM | POA: Diagnosis not present

## 2023-12-07 DIAGNOSIS — S32049D Unspecified fracture of fourth lumbar vertebra, subsequent encounter for fracture with routine healing: Secondary | ICD-10-CM | POA: Diagnosis not present

## 2023-12-07 DIAGNOSIS — S2221XD Fracture of manubrium, subsequent encounter for fracture with routine healing: Secondary | ICD-10-CM | POA: Diagnosis not present

## 2023-12-07 DIAGNOSIS — S32119D Unspecified Zone I fracture of sacrum, subsequent encounter for fracture with routine healing: Secondary | ICD-10-CM | POA: Diagnosis not present

## 2023-12-07 DIAGNOSIS — S52501D Unspecified fracture of the lower end of right radius, subsequent encounter for closed fracture with routine healing: Secondary | ICD-10-CM | POA: Diagnosis not present

## 2023-12-10 DIAGNOSIS — S32591D Other specified fracture of right pubis, subsequent encounter for fracture with routine healing: Secondary | ICD-10-CM | POA: Diagnosis not present

## 2023-12-10 DIAGNOSIS — S32119D Unspecified Zone I fracture of sacrum, subsequent encounter for fracture with routine healing: Secondary | ICD-10-CM | POA: Diagnosis not present

## 2023-12-10 DIAGNOSIS — S32592D Other specified fracture of left pubis, subsequent encounter for fracture with routine healing: Secondary | ICD-10-CM | POA: Diagnosis not present

## 2023-12-10 DIAGNOSIS — S2221XD Fracture of manubrium, subsequent encounter for fracture with routine healing: Secondary | ICD-10-CM | POA: Diagnosis not present

## 2023-12-10 DIAGNOSIS — S32049D Unspecified fracture of fourth lumbar vertebra, subsequent encounter for fracture with routine healing: Secondary | ICD-10-CM | POA: Diagnosis not present

## 2023-12-10 DIAGNOSIS — S32059D Unspecified fracture of fifth lumbar vertebra, subsequent encounter for fracture with routine healing: Secondary | ICD-10-CM | POA: Diagnosis not present

## 2023-12-10 DIAGNOSIS — I119 Hypertensive heart disease without heart failure: Secondary | ICD-10-CM | POA: Diagnosis not present

## 2023-12-10 DIAGNOSIS — S52501D Unspecified fracture of the lower end of right radius, subsequent encounter for closed fracture with routine healing: Secondary | ICD-10-CM | POA: Diagnosis not present

## 2023-12-10 DIAGNOSIS — S2242XD Multiple fractures of ribs, left side, subsequent encounter for fracture with routine healing: Secondary | ICD-10-CM | POA: Diagnosis not present

## 2023-12-11 DIAGNOSIS — S32119D Unspecified Zone I fracture of sacrum, subsequent encounter for fracture with routine healing: Secondary | ICD-10-CM | POA: Diagnosis not present

## 2023-12-11 DIAGNOSIS — S52501D Unspecified fracture of the lower end of right radius, subsequent encounter for closed fracture with routine healing: Secondary | ICD-10-CM | POA: Diagnosis not present

## 2023-12-11 DIAGNOSIS — S2242XD Multiple fractures of ribs, left side, subsequent encounter for fracture with routine healing: Secondary | ICD-10-CM | POA: Diagnosis not present

## 2023-12-11 DIAGNOSIS — S32059D Unspecified fracture of fifth lumbar vertebra, subsequent encounter for fracture with routine healing: Secondary | ICD-10-CM | POA: Diagnosis not present

## 2023-12-11 DIAGNOSIS — S32592D Other specified fracture of left pubis, subsequent encounter for fracture with routine healing: Secondary | ICD-10-CM | POA: Diagnosis not present

## 2023-12-11 DIAGNOSIS — S32591D Other specified fracture of right pubis, subsequent encounter for fracture with routine healing: Secondary | ICD-10-CM | POA: Diagnosis not present

## 2023-12-11 DIAGNOSIS — S32049D Unspecified fracture of fourth lumbar vertebra, subsequent encounter for fracture with routine healing: Secondary | ICD-10-CM | POA: Diagnosis not present

## 2023-12-11 DIAGNOSIS — I119 Hypertensive heart disease without heart failure: Secondary | ICD-10-CM | POA: Diagnosis not present

## 2023-12-11 DIAGNOSIS — S2221XD Fracture of manubrium, subsequent encounter for fracture with routine healing: Secondary | ICD-10-CM | POA: Diagnosis not present

## 2023-12-12 ENCOUNTER — Telehealth: Payer: Self-pay | Admitting: Orthopedic Surgery

## 2023-12-12 DIAGNOSIS — S2242XD Multiple fractures of ribs, left side, subsequent encounter for fracture with routine healing: Secondary | ICD-10-CM | POA: Diagnosis not present

## 2023-12-12 DIAGNOSIS — S32059D Unspecified fracture of fifth lumbar vertebra, subsequent encounter for fracture with routine healing: Secondary | ICD-10-CM | POA: Diagnosis not present

## 2023-12-12 DIAGNOSIS — S32592D Other specified fracture of left pubis, subsequent encounter for fracture with routine healing: Secondary | ICD-10-CM | POA: Diagnosis not present

## 2023-12-12 DIAGNOSIS — S52501D Unspecified fracture of the lower end of right radius, subsequent encounter for closed fracture with routine healing: Secondary | ICD-10-CM | POA: Diagnosis not present

## 2023-12-12 DIAGNOSIS — I119 Hypertensive heart disease without heart failure: Secondary | ICD-10-CM | POA: Diagnosis not present

## 2023-12-12 DIAGNOSIS — S2221XD Fracture of manubrium, subsequent encounter for fracture with routine healing: Secondary | ICD-10-CM | POA: Diagnosis not present

## 2023-12-12 DIAGNOSIS — S32119D Unspecified Zone I fracture of sacrum, subsequent encounter for fracture with routine healing: Secondary | ICD-10-CM | POA: Diagnosis not present

## 2023-12-12 DIAGNOSIS — S32591D Other specified fracture of right pubis, subsequent encounter for fracture with routine healing: Secondary | ICD-10-CM | POA: Diagnosis not present

## 2023-12-12 DIAGNOSIS — S32049D Unspecified fracture of fourth lumbar vertebra, subsequent encounter for fracture with routine healing: Secondary | ICD-10-CM | POA: Diagnosis not present

## 2023-12-12 NOTE — Telephone Encounter (Signed)
 Patient called and said Dr. Sulema Endo seen her at the hospital and he told her to f/u next. Can you fit her in? [phone]

## 2023-12-13 DIAGNOSIS — S32119D Unspecified Zone I fracture of sacrum, subsequent encounter for fracture with routine healing: Secondary | ICD-10-CM | POA: Diagnosis not present

## 2023-12-13 DIAGNOSIS — S52501D Unspecified fracture of the lower end of right radius, subsequent encounter for closed fracture with routine healing: Secondary | ICD-10-CM | POA: Diagnosis not present

## 2023-12-13 DIAGNOSIS — S32049D Unspecified fracture of fourth lumbar vertebra, subsequent encounter for fracture with routine healing: Secondary | ICD-10-CM | POA: Diagnosis not present

## 2023-12-13 DIAGNOSIS — S32059D Unspecified fracture of fifth lumbar vertebra, subsequent encounter for fracture with routine healing: Secondary | ICD-10-CM | POA: Diagnosis not present

## 2023-12-13 DIAGNOSIS — S32591D Other specified fracture of right pubis, subsequent encounter for fracture with routine healing: Secondary | ICD-10-CM | POA: Diagnosis not present

## 2023-12-13 DIAGNOSIS — S32592D Other specified fracture of left pubis, subsequent encounter for fracture with routine healing: Secondary | ICD-10-CM | POA: Diagnosis not present

## 2023-12-13 DIAGNOSIS — S2221XD Fracture of manubrium, subsequent encounter for fracture with routine healing: Secondary | ICD-10-CM | POA: Diagnosis not present

## 2023-12-13 DIAGNOSIS — S2242XD Multiple fractures of ribs, left side, subsequent encounter for fracture with routine healing: Secondary | ICD-10-CM | POA: Diagnosis not present

## 2023-12-13 DIAGNOSIS — I119 Hypertensive heart disease without heart failure: Secondary | ICD-10-CM | POA: Diagnosis not present

## 2023-12-13 DIAGNOSIS — I4891 Unspecified atrial fibrillation: Secondary | ICD-10-CM | POA: Diagnosis not present

## 2023-12-13 NOTE — Telephone Encounter (Signed)
 Made note on appt note for pelvic fx.

## 2023-12-18 ENCOUNTER — Ambulatory Visit: Admitting: Orthopedic Surgery

## 2023-12-18 DIAGNOSIS — S32119D Unspecified Zone I fracture of sacrum, subsequent encounter for fracture with routine healing: Secondary | ICD-10-CM | POA: Diagnosis not present

## 2023-12-18 DIAGNOSIS — S2242XD Multiple fractures of ribs, left side, subsequent encounter for fracture with routine healing: Secondary | ICD-10-CM | POA: Diagnosis not present

## 2023-12-18 DIAGNOSIS — S2221XD Fracture of manubrium, subsequent encounter for fracture with routine healing: Secondary | ICD-10-CM | POA: Diagnosis not present

## 2023-12-18 DIAGNOSIS — S32049D Unspecified fracture of fourth lumbar vertebra, subsequent encounter for fracture with routine healing: Secondary | ICD-10-CM | POA: Diagnosis not present

## 2023-12-18 DIAGNOSIS — S32591D Other specified fracture of right pubis, subsequent encounter for fracture with routine healing: Secondary | ICD-10-CM | POA: Diagnosis not present

## 2023-12-18 DIAGNOSIS — I119 Hypertensive heart disease without heart failure: Secondary | ICD-10-CM | POA: Diagnosis not present

## 2023-12-18 DIAGNOSIS — S32059D Unspecified fracture of fifth lumbar vertebra, subsequent encounter for fracture with routine healing: Secondary | ICD-10-CM | POA: Diagnosis not present

## 2023-12-18 DIAGNOSIS — S32592D Other specified fracture of left pubis, subsequent encounter for fracture with routine healing: Secondary | ICD-10-CM | POA: Diagnosis not present

## 2023-12-18 DIAGNOSIS — S52501D Unspecified fracture of the lower end of right radius, subsequent encounter for closed fracture with routine healing: Secondary | ICD-10-CM | POA: Diagnosis not present

## 2023-12-19 ENCOUNTER — Other Ambulatory Visit (HOSPITAL_COMMUNITY): Payer: Self-pay

## 2023-12-19 DIAGNOSIS — S32810A Multiple fractures of pelvis with stable disruption of pelvic ring, initial encounter for closed fracture: Secondary | ICD-10-CM | POA: Diagnosis not present

## 2023-12-19 DIAGNOSIS — S52501A Unspecified fracture of the lower end of right radius, initial encounter for closed fracture: Secondary | ICD-10-CM | POA: Diagnosis not present

## 2023-12-20 DIAGNOSIS — S32059D Unspecified fracture of fifth lumbar vertebra, subsequent encounter for fracture with routine healing: Secondary | ICD-10-CM | POA: Diagnosis not present

## 2023-12-20 DIAGNOSIS — R3915 Urgency of urination: Secondary | ICD-10-CM | POA: Diagnosis not present

## 2023-12-20 DIAGNOSIS — S32049D Unspecified fracture of fourth lumbar vertebra, subsequent encounter for fracture with routine healing: Secondary | ICD-10-CM | POA: Diagnosis not present

## 2023-12-20 DIAGNOSIS — S32119D Unspecified Zone I fracture of sacrum, subsequent encounter for fracture with routine healing: Secondary | ICD-10-CM | POA: Diagnosis not present

## 2023-12-20 DIAGNOSIS — S52501D Unspecified fracture of the lower end of right radius, subsequent encounter for closed fracture with routine healing: Secondary | ICD-10-CM | POA: Diagnosis not present

## 2023-12-20 DIAGNOSIS — S2221XD Fracture of manubrium, subsequent encounter for fracture with routine healing: Secondary | ICD-10-CM | POA: Diagnosis not present

## 2023-12-20 DIAGNOSIS — S32592D Other specified fracture of left pubis, subsequent encounter for fracture with routine healing: Secondary | ICD-10-CM | POA: Diagnosis not present

## 2023-12-20 DIAGNOSIS — S32591D Other specified fracture of right pubis, subsequent encounter for fracture with routine healing: Secondary | ICD-10-CM | POA: Diagnosis not present

## 2023-12-20 DIAGNOSIS — I119 Hypertensive heart disease without heart failure: Secondary | ICD-10-CM | POA: Diagnosis not present

## 2023-12-20 DIAGNOSIS — S2242XD Multiple fractures of ribs, left side, subsequent encounter for fracture with routine healing: Secondary | ICD-10-CM | POA: Diagnosis not present

## 2023-12-23 DIAGNOSIS — S3282XA Multiple fractures of pelvis without disruption of pelvic ring, initial encounter for closed fracture: Secondary | ICD-10-CM | POA: Diagnosis not present

## 2023-12-24 DIAGNOSIS — N1831 Chronic kidney disease, stage 3a: Secondary | ICD-10-CM | POA: Diagnosis not present

## 2023-12-24 DIAGNOSIS — E785 Hyperlipidemia, unspecified: Secondary | ICD-10-CM | POA: Diagnosis not present

## 2023-12-24 DIAGNOSIS — C50911 Malignant neoplasm of unspecified site of right female breast: Secondary | ICD-10-CM | POA: Diagnosis not present

## 2023-12-24 DIAGNOSIS — E039 Hypothyroidism, unspecified: Secondary | ICD-10-CM | POA: Diagnosis not present

## 2023-12-24 DIAGNOSIS — I129 Hypertensive chronic kidney disease with stage 1 through stage 4 chronic kidney disease, or unspecified chronic kidney disease: Secondary | ICD-10-CM | POA: Diagnosis not present

## 2023-12-24 DIAGNOSIS — D62 Acute posthemorrhagic anemia: Secondary | ICD-10-CM | POA: Diagnosis not present

## 2023-12-24 DIAGNOSIS — S32591A Other specified fracture of right pubis, initial encounter for closed fracture: Secondary | ICD-10-CM | POA: Diagnosis not present

## 2023-12-24 DIAGNOSIS — I480 Paroxysmal atrial fibrillation: Secondary | ICD-10-CM | POA: Diagnosis not present

## 2023-12-24 DIAGNOSIS — S62101A Fracture of unspecified carpal bone, right wrist, initial encounter for closed fracture: Secondary | ICD-10-CM | POA: Diagnosis not present

## 2023-12-25 DIAGNOSIS — S52501D Unspecified fracture of the lower end of right radius, subsequent encounter for closed fracture with routine healing: Secondary | ICD-10-CM | POA: Diagnosis not present

## 2023-12-25 DIAGNOSIS — S32059D Unspecified fracture of fifth lumbar vertebra, subsequent encounter for fracture with routine healing: Secondary | ICD-10-CM | POA: Diagnosis not present

## 2023-12-25 DIAGNOSIS — S2221XD Fracture of manubrium, subsequent encounter for fracture with routine healing: Secondary | ICD-10-CM | POA: Diagnosis not present

## 2023-12-25 DIAGNOSIS — S32119D Unspecified Zone I fracture of sacrum, subsequent encounter for fracture with routine healing: Secondary | ICD-10-CM | POA: Diagnosis not present

## 2023-12-25 DIAGNOSIS — N1831 Chronic kidney disease, stage 3a: Secondary | ICD-10-CM | POA: Diagnosis not present

## 2023-12-25 DIAGNOSIS — S32049D Unspecified fracture of fourth lumbar vertebra, subsequent encounter for fracture with routine healing: Secondary | ICD-10-CM | POA: Diagnosis not present

## 2023-12-25 DIAGNOSIS — S32592D Other specified fracture of left pubis, subsequent encounter for fracture with routine healing: Secondary | ICD-10-CM | POA: Diagnosis not present

## 2023-12-25 DIAGNOSIS — I129 Hypertensive chronic kidney disease with stage 1 through stage 4 chronic kidney disease, or unspecified chronic kidney disease: Secondary | ICD-10-CM | POA: Diagnosis not present

## 2023-12-25 DIAGNOSIS — I119 Hypertensive heart disease without heart failure: Secondary | ICD-10-CM | POA: Diagnosis not present

## 2023-12-25 DIAGNOSIS — S2242XD Multiple fractures of ribs, left side, subsequent encounter for fracture with routine healing: Secondary | ICD-10-CM | POA: Diagnosis not present

## 2023-12-25 DIAGNOSIS — S32591D Other specified fracture of right pubis, subsequent encounter for fracture with routine healing: Secondary | ICD-10-CM | POA: Diagnosis not present

## 2023-12-26 DIAGNOSIS — S32119D Unspecified Zone I fracture of sacrum, subsequent encounter for fracture with routine healing: Secondary | ICD-10-CM | POA: Diagnosis not present

## 2023-12-26 DIAGNOSIS — S32049D Unspecified fracture of fourth lumbar vertebra, subsequent encounter for fracture with routine healing: Secondary | ICD-10-CM | POA: Diagnosis not present

## 2023-12-26 DIAGNOSIS — S32592D Other specified fracture of left pubis, subsequent encounter for fracture with routine healing: Secondary | ICD-10-CM | POA: Diagnosis not present

## 2023-12-26 DIAGNOSIS — S2242XD Multiple fractures of ribs, left side, subsequent encounter for fracture with routine healing: Secondary | ICD-10-CM | POA: Diagnosis not present

## 2023-12-26 DIAGNOSIS — S2221XD Fracture of manubrium, subsequent encounter for fracture with routine healing: Secondary | ICD-10-CM | POA: Diagnosis not present

## 2023-12-26 DIAGNOSIS — S32059D Unspecified fracture of fifth lumbar vertebra, subsequent encounter for fracture with routine healing: Secondary | ICD-10-CM | POA: Diagnosis not present

## 2023-12-26 DIAGNOSIS — I119 Hypertensive heart disease without heart failure: Secondary | ICD-10-CM | POA: Diagnosis not present

## 2023-12-26 DIAGNOSIS — S52501D Unspecified fracture of the lower end of right radius, subsequent encounter for closed fracture with routine healing: Secondary | ICD-10-CM | POA: Diagnosis not present

## 2023-12-26 DIAGNOSIS — S32591D Other specified fracture of right pubis, subsequent encounter for fracture with routine healing: Secondary | ICD-10-CM | POA: Diagnosis not present

## 2023-12-30 DIAGNOSIS — S32049D Unspecified fracture of fourth lumbar vertebra, subsequent encounter for fracture with routine healing: Secondary | ICD-10-CM | POA: Diagnosis not present

## 2023-12-30 DIAGNOSIS — S2242XD Multiple fractures of ribs, left side, subsequent encounter for fracture with routine healing: Secondary | ICD-10-CM | POA: Diagnosis not present

## 2023-12-30 DIAGNOSIS — S32592D Other specified fracture of left pubis, subsequent encounter for fracture with routine healing: Secondary | ICD-10-CM | POA: Diagnosis not present

## 2023-12-30 DIAGNOSIS — S32119D Unspecified Zone I fracture of sacrum, subsequent encounter for fracture with routine healing: Secondary | ICD-10-CM | POA: Diagnosis not present

## 2023-12-30 DIAGNOSIS — S32591D Other specified fracture of right pubis, subsequent encounter for fracture with routine healing: Secondary | ICD-10-CM | POA: Diagnosis not present

## 2023-12-30 DIAGNOSIS — S52501D Unspecified fracture of the lower end of right radius, subsequent encounter for closed fracture with routine healing: Secondary | ICD-10-CM | POA: Diagnosis not present

## 2023-12-30 DIAGNOSIS — S32059D Unspecified fracture of fifth lumbar vertebra, subsequent encounter for fracture with routine healing: Secondary | ICD-10-CM | POA: Diagnosis not present

## 2023-12-30 DIAGNOSIS — I119 Hypertensive heart disease without heart failure: Secondary | ICD-10-CM | POA: Diagnosis not present

## 2023-12-30 DIAGNOSIS — S2221XD Fracture of manubrium, subsequent encounter for fracture with routine healing: Secondary | ICD-10-CM | POA: Diagnosis not present

## 2024-01-02 DIAGNOSIS — S32592D Other specified fracture of left pubis, subsequent encounter for fracture with routine healing: Secondary | ICD-10-CM | POA: Diagnosis not present

## 2024-01-02 DIAGNOSIS — S2221XD Fracture of manubrium, subsequent encounter for fracture with routine healing: Secondary | ICD-10-CM | POA: Diagnosis not present

## 2024-01-02 DIAGNOSIS — I119 Hypertensive heart disease without heart failure: Secondary | ICD-10-CM | POA: Diagnosis not present

## 2024-01-02 DIAGNOSIS — S32059D Unspecified fracture of fifth lumbar vertebra, subsequent encounter for fracture with routine healing: Secondary | ICD-10-CM | POA: Diagnosis not present

## 2024-01-02 DIAGNOSIS — S32049D Unspecified fracture of fourth lumbar vertebra, subsequent encounter for fracture with routine healing: Secondary | ICD-10-CM | POA: Diagnosis not present

## 2024-01-02 DIAGNOSIS — S32591D Other specified fracture of right pubis, subsequent encounter for fracture with routine healing: Secondary | ICD-10-CM | POA: Diagnosis not present

## 2024-01-02 DIAGNOSIS — S32119D Unspecified Zone I fracture of sacrum, subsequent encounter for fracture with routine healing: Secondary | ICD-10-CM | POA: Diagnosis not present

## 2024-01-02 DIAGNOSIS — S2242XD Multiple fractures of ribs, left side, subsequent encounter for fracture with routine healing: Secondary | ICD-10-CM | POA: Diagnosis not present

## 2024-01-02 DIAGNOSIS — S52501D Unspecified fracture of the lower end of right radius, subsequent encounter for closed fracture with routine healing: Secondary | ICD-10-CM | POA: Diagnosis not present

## 2024-01-10 DIAGNOSIS — S52501A Unspecified fracture of the lower end of right radius, initial encounter for closed fracture: Secondary | ICD-10-CM | POA: Diagnosis not present

## 2024-01-15 ENCOUNTER — Ambulatory Visit: Attending: Physician Assistant

## 2024-01-15 DIAGNOSIS — I4891 Unspecified atrial fibrillation: Secondary | ICD-10-CM

## 2024-01-17 ENCOUNTER — Ambulatory Visit: Payer: Self-pay | Admitting: Physician Assistant

## 2024-01-22 NOTE — Telephone Encounter (Signed)
Spoke with pt. Pt was notified of monitor results.  Pt voiced understanding.

## 2024-01-23 DIAGNOSIS — S52501A Unspecified fracture of the lower end of right radius, initial encounter for closed fracture: Secondary | ICD-10-CM | POA: Diagnosis not present

## 2024-01-28 ENCOUNTER — Telehealth: Payer: Self-pay | Admitting: Orthopaedic Surgery

## 2024-01-28 DIAGNOSIS — M25631 Stiffness of right wrist, not elsewhere classified: Secondary | ICD-10-CM | POA: Diagnosis not present

## 2024-01-28 DIAGNOSIS — R29898 Other symptoms and signs involving the musculoskeletal system: Secondary | ICD-10-CM | POA: Diagnosis not present

## 2024-01-28 NOTE — Telephone Encounter (Signed)
 Faxed an urgent request for xray reports on Pelvis from [facility] to [phone]. Patient has appt. 6/18 w/ Dr. Lucienne Ryder

## 2024-01-29 ENCOUNTER — Other Ambulatory Visit (INDEPENDENT_AMBULATORY_CARE_PROVIDER_SITE_OTHER): Payer: Self-pay

## 2024-01-29 ENCOUNTER — Ambulatory Visit: Admitting: Orthopaedic Surgery

## 2024-01-29 DIAGNOSIS — M25551 Pain in right hip: Secondary | ICD-10-CM
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
## 2024-01-30 DIAGNOSIS — M810 Age-related osteoporosis without current pathological fracture: Secondary | ICD-10-CM | POA: Diagnosis not present

## 2024-02-03 DIAGNOSIS — E039 Hypothyroidism, unspecified: Secondary | ICD-10-CM | POA: Diagnosis not present

## 2024-02-03 DIAGNOSIS — E559 Vitamin D deficiency, unspecified: Secondary | ICD-10-CM | POA: Diagnosis not present

## 2024-02-03 DIAGNOSIS — D630 Anemia in neoplastic disease: Secondary | ICD-10-CM | POA: Diagnosis not present

## 2024-02-03 DIAGNOSIS — K449 Diaphragmatic hernia without obstruction or gangrene: Secondary | ICD-10-CM | POA: Diagnosis not present

## 2024-02-03 DIAGNOSIS — I4891 Unspecified atrial fibrillation: Secondary | ICD-10-CM | POA: Diagnosis not present

## 2024-02-03 DIAGNOSIS — J982 Interstitial emphysema: Secondary | ICD-10-CM | POA: Diagnosis not present

## 2024-02-03 DIAGNOSIS — C50411 Malignant neoplasm of upper-outer quadrant of right female breast: Secondary | ICD-10-CM | POA: Diagnosis not present

## 2024-02-03 DIAGNOSIS — C50112 Malignant neoplasm of central portion of left female breast: Secondary | ICD-10-CM | POA: Diagnosis not present

## 2024-03-02 ENCOUNTER — Ambulatory Visit: Admitting: Cardiovascular Disease

## 2024-03-03 ENCOUNTER — Ambulatory Visit: Attending: Cardiology | Admitting: Cardiology

## 2024-03-03 ENCOUNTER — Encounter: Payer: Self-pay | Admitting: Cardiology

## 2024-03-03 VITALS — BP 82/64 | HR 72 | Ht 65.0 in | Wt 126.2 lb

## 2024-03-03 DIAGNOSIS — I480 Paroxysmal atrial fibrillation: Secondary | ICD-10-CM | POA: Insufficient documentation

## 2024-03-03 DIAGNOSIS — R0683 Snoring: Secondary | ICD-10-CM | POA: Diagnosis not present

## 2024-03-03 MED ORDER — METOPROLOL TARTRATE 25 MG PO TABS: 12.5000 mg | ORAL_TABLET | Freq: Every day | ORAL | Status: DC | PRN

## 2024-03-03 NOTE — Patient Instructions (Addendum)
 Medication Instructions:  CHANGE Metoprolol  to as needed daily for heart palpitations   *If you need a refill on your cardiac medications before your next appointment, please call your pharmacy*  Testing/Procedures: REFERRAL TO SLEEP STUDY  Your physician has recommended that you have a sleep study. This test records several body functions during sleep, including: brain activity, eye movement, oxygen and carbon dioxide blood levels, heart rate and rhythm, breathing rate and rhythm, the flow of air through your mouth and nose, snoring, body muscle movements, and chest and belly movement.   Follow-Up: At [facility], you and your health needs are our priority.  As part of our continuing mission to provide you with exceptional heart care, our providers are all part of one team.  This team includes your primary Cardiologist (physician) and Advanced Practice Providers or APPs (Physician Assistants and Nurse Practitioners) who all work together to provide you with the care you need, when you need it.  Your next appointment:   1 YEAR  Provider:   Newman JINNY Lawrence, MD
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
## 2024-03-23 DIAGNOSIS — N1831 Chronic kidney disease, stage 3a: Secondary | ICD-10-CM | POA: Diagnosis not present

## 2024-03-23 DIAGNOSIS — I129 Hypertensive chronic kidney disease with stage 1 through stage 4 chronic kidney disease, or unspecified chronic kidney disease: Secondary | ICD-10-CM | POA: Diagnosis not present

## 2024-03-23 DIAGNOSIS — R7301 Impaired fasting glucose: Secondary | ICD-10-CM | POA: Diagnosis not present

## 2024-03-23 DIAGNOSIS — E039 Hypothyroidism, unspecified: Secondary | ICD-10-CM | POA: Diagnosis not present

## 2024-03-23 DIAGNOSIS — E785 Hyperlipidemia, unspecified: Secondary | ICD-10-CM | POA: Diagnosis not present

## 2024-03-23 DIAGNOSIS — E7849 Other hyperlipidemia: Secondary | ICD-10-CM | POA: Diagnosis not present

## 2024-03-23 DIAGNOSIS — D649 Anemia, unspecified: Secondary | ICD-10-CM | POA: Diagnosis not present

## 2024-03-23 DIAGNOSIS — M810 Age-related osteoporosis without current pathological fracture: Secondary | ICD-10-CM | POA: Diagnosis not present

## 2024-03-25 DIAGNOSIS — M6281 Muscle weakness (generalized): Secondary | ICD-10-CM | POA: Diagnosis not present

## 2024-03-25 DIAGNOSIS — M25551 Pain in right hip: Secondary | ICD-10-CM | POA: Diagnosis not present

## 2024-03-25 DIAGNOSIS — N8184 Pelvic muscle wasting: Secondary | ICD-10-CM | POA: Diagnosis not present

## 2024-03-25 DIAGNOSIS — R262 Difficulty in walking, not elsewhere classified: Secondary | ICD-10-CM | POA: Diagnosis not present

## 2024-03-25 DIAGNOSIS — N3946 Mixed incontinence: Secondary | ICD-10-CM | POA: Diagnosis not present

## 2024-04-01 DIAGNOSIS — I480 Paroxysmal atrial fibrillation: Secondary | ICD-10-CM | POA: Diagnosis not present

## 2024-04-01 DIAGNOSIS — R82998 Other abnormal findings in urine: Secondary | ICD-10-CM | POA: Diagnosis not present

## 2024-04-01 DIAGNOSIS — R32 Unspecified urinary incontinence: Secondary | ICD-10-CM | POA: Diagnosis not present

## 2024-04-01 DIAGNOSIS — Z0000 Encounter for general adult medical examination without abnormal findings: Secondary | ICD-10-CM | POA: Diagnosis not present

## 2024-04-01 DIAGNOSIS — E039 Hypothyroidism, unspecified: Secondary | ICD-10-CM | POA: Diagnosis not present

## 2024-04-01 DIAGNOSIS — N1831 Chronic kidney disease, stage 3a: Secondary | ICD-10-CM | POA: Diagnosis not present

## 2024-04-01 DIAGNOSIS — M25551 Pain in right hip: Secondary | ICD-10-CM | POA: Diagnosis not present

## 2024-04-01 DIAGNOSIS — M6281 Muscle weakness (generalized): Secondary | ICD-10-CM | POA: Diagnosis not present

## 2024-04-01 DIAGNOSIS — R262 Difficulty in walking, not elsewhere classified: Secondary | ICD-10-CM | POA: Diagnosis not present

## 2024-04-01 DIAGNOSIS — E785 Hyperlipidemia, unspecified: Secondary | ICD-10-CM | POA: Diagnosis not present

## 2024-04-01 DIAGNOSIS — I129 Hypertensive chronic kidney disease with stage 1 through stage 4 chronic kidney disease, or unspecified chronic kidney disease: Secondary | ICD-10-CM | POA: Diagnosis not present

## 2024-04-01 DIAGNOSIS — Z1339 Encounter for screening examination for other mental health and behavioral disorders: Secondary | ICD-10-CM | POA: Diagnosis not present

## 2024-04-01 DIAGNOSIS — N3941 Urge incontinence: Secondary | ICD-10-CM | POA: Diagnosis not present

## 2024-04-01 DIAGNOSIS — M810 Age-related osteoporosis without current pathological fracture: Secondary | ICD-10-CM | POA: Diagnosis not present

## 2024-04-01 DIAGNOSIS — C50911 Malignant neoplasm of unspecified site of right female breast: Secondary | ICD-10-CM | POA: Diagnosis not present

## 2024-04-01 DIAGNOSIS — Z1331 Encounter for screening for depression: Secondary | ICD-10-CM | POA: Diagnosis not present

## 2024-04-06 DIAGNOSIS — M6281 Muscle weakness (generalized): Secondary | ICD-10-CM | POA: Diagnosis not present

## 2024-04-06 DIAGNOSIS — R262 Difficulty in walking, not elsewhere classified: Secondary | ICD-10-CM | POA: Diagnosis not present

## 2024-04-06 DIAGNOSIS — M25551 Pain in right hip: Secondary | ICD-10-CM | POA: Diagnosis not present

## 2024-04-06 DIAGNOSIS — R32 Unspecified urinary incontinence: Secondary | ICD-10-CM | POA: Diagnosis not present

## 2024-04-15 DIAGNOSIS — M25551 Pain in right hip: Secondary | ICD-10-CM | POA: Diagnosis not present

## 2024-04-15 DIAGNOSIS — R262 Difficulty in walking, not elsewhere classified: Secondary | ICD-10-CM | POA: Diagnosis not present

## 2024-04-15 DIAGNOSIS — M6281 Muscle weakness (generalized): Secondary | ICD-10-CM | POA: Diagnosis not present

## 2024-04-15 DIAGNOSIS — R32 Unspecified urinary incontinence: Secondary | ICD-10-CM | POA: Diagnosis not present

## 2024-04-17 ENCOUNTER — Other Ambulatory Visit: Payer: Self-pay | Admitting: Adult Health

## 2024-04-22 DIAGNOSIS — M6281 Muscle weakness (generalized): Secondary | ICD-10-CM | POA: Diagnosis not present

## 2024-04-22 DIAGNOSIS — R32 Unspecified urinary incontinence: Secondary | ICD-10-CM | POA: Diagnosis not present

## 2024-04-22 DIAGNOSIS — R262 Difficulty in walking, not elsewhere classified: Secondary | ICD-10-CM | POA: Diagnosis not present

## 2024-04-22 DIAGNOSIS — M25551 Pain in right hip: Secondary | ICD-10-CM | POA: Diagnosis not present

## 2024-04-29 DIAGNOSIS — R32 Unspecified urinary incontinence: Secondary | ICD-10-CM | POA: Diagnosis not present

## 2024-04-29 DIAGNOSIS — M25551 Pain in right hip: Secondary | ICD-10-CM | POA: Diagnosis not present

## 2024-04-29 DIAGNOSIS — M6281 Muscle weakness (generalized): Secondary | ICD-10-CM | POA: Diagnosis not present

## 2024-04-29 DIAGNOSIS — R262 Difficulty in walking, not elsewhere classified: Secondary | ICD-10-CM | POA: Diagnosis not present

## 2024-05-06 DIAGNOSIS — M6281 Muscle weakness (generalized): Secondary | ICD-10-CM | POA: Diagnosis not present

## 2024-05-06 DIAGNOSIS — M25551 Pain in right hip: Secondary | ICD-10-CM | POA: Diagnosis not present

## 2024-05-06 DIAGNOSIS — R32 Unspecified urinary incontinence: Secondary | ICD-10-CM | POA: Diagnosis not present

## 2024-05-06 DIAGNOSIS — R262 Difficulty in walking, not elsewhere classified: Secondary | ICD-10-CM | POA: Diagnosis not present

## 2024-05-13 ENCOUNTER — Telehealth: Payer: Self-pay | Admitting: Cardiology

## 2024-05-13 DIAGNOSIS — R32 Unspecified urinary incontinence: Secondary | ICD-10-CM | POA: Diagnosis not present

## 2024-05-13 DIAGNOSIS — M6281 Muscle weakness (generalized): Secondary | ICD-10-CM | POA: Diagnosis not present

## 2024-05-13 DIAGNOSIS — M25551 Pain in right hip: Secondary | ICD-10-CM | POA: Diagnosis not present

## 2024-05-13 DIAGNOSIS — R262 Difficulty in walking, not elsewhere classified: Secondary | ICD-10-CM | POA: Diagnosis not present

## 2024-05-13 NOTE — Telephone Encounter (Signed)
 Patient identification verified by 2 forms.   Called and spoke to patient.  Pt made aware a referral has been placed for sleep studies on 7/22. Pt verbalized understanding she thanked me for the call and stated no rush

## 2024-05-13 NOTE — Telephone Encounter (Signed)
 Pt calling to ask if she will still be referred for a sleep study as discuss per last appt. Please advise.

## 2024-05-14 ENCOUNTER — Ambulatory Visit: Admitting: Cardiology

## 2024-05-20 DIAGNOSIS — M25551 Pain in right hip: Secondary | ICD-10-CM | POA: Diagnosis not present

## 2024-05-20 DIAGNOSIS — M6281 Muscle weakness (generalized): Secondary | ICD-10-CM | POA: Diagnosis not present

## 2024-05-20 DIAGNOSIS — R262 Difficulty in walking, not elsewhere classified: Secondary | ICD-10-CM | POA: Diagnosis not present

## 2024-05-20 DIAGNOSIS — R32 Unspecified urinary incontinence: Secondary | ICD-10-CM | POA: Diagnosis not present

## 2024-06-04 ENCOUNTER — Telehealth: Payer: Self-pay | Admitting: Orthopaedic Surgery

## 2024-06-04 NOTE — Telephone Encounter (Signed)
 Pt called stating that she is having rt hip pain and would like to have an x-ray asap. Pt call back number is [phone]

## 2024-06-04 NOTE — Telephone Encounter (Signed)
 Left voicemail. I can work in with [place] on Wed. 10/29

## 2024-06-10 ENCOUNTER — Ambulatory Visit: Admitting: Physician Assistant

## 2024-06-10 DIAGNOSIS — M1611 Unilateral primary osteoarthritis, right hip: Secondary | ICD-10-CM
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
## 2024-06-15 ENCOUNTER — Encounter: Payer: Self-pay | Admitting: Radiology

## 2024-07-01 ENCOUNTER — Ambulatory Visit: Admitting: Orthopaedic Surgery

## 2024-07-06 ENCOUNTER — Inpatient Hospital Stay: Payer: Medicare PPO | Attending: Hematology and Oncology | Admitting: Hematology and Oncology

## 2024-07-06 VITALS — BP 159/54 | HR 60 | Temp 97.8°F | Resp 18 | Ht 65.0 in | Wt 124.4 lb

## 2024-07-06 DIAGNOSIS — C50112 Malignant neoplasm of central portion of left female breast: Secondary | ICD-10-CM | POA: Diagnosis not present

## 2024-07-06 DIAGNOSIS — C50411 Malignant neoplasm of upper-outer quadrant of right female breast: Secondary | ICD-10-CM | POA: Diagnosis not present

## 2024-07-06 DIAGNOSIS — Z79811 Long term (current) use of aromatase inhibitors: Secondary | ICD-10-CM | POA: Diagnosis not present

## 2024-07-06 DIAGNOSIS — Z1732 Human epidermal growth factor receptor 2 negative status: Secondary | ICD-10-CM | POA: Diagnosis not present

## 2024-07-06 DIAGNOSIS — Z1721 Progesterone receptor positive status: Secondary | ICD-10-CM | POA: Diagnosis not present

## 2024-07-06 DIAGNOSIS — Z923 Personal history of irradiation: Secondary | ICD-10-CM | POA: Diagnosis not present

## 2024-07-06 DIAGNOSIS — Z170 Estrogen receptor positive status [ER+]: Secondary | ICD-10-CM | POA: Diagnosis not present
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
## 2024-07-06 NOTE — Assessment & Plan Note (Signed)
 06/12/2006: Left breast cancer status post left lumpectomy T1CNX stage Ia grade 1 ER/PR positive HER2 negative Ki-67 3%, radiation, letrozole 03/23/2021: Right breast cancer T2N0 stage Ia grade 1 IDC ER/PR positive HER2 negative Ki-67 1% status post right lumpectomy stage Ib grade 2 IDC 0/5 lymph nodes, radiation, tamoxifen    Current treatment: Tamoxifen  started 08/13/2021 Tamoxifen  toxicities: Mild hot flashes, frequent urinary infections, leg cramps We reduced the dosage of tamoxifen  to 10 mg a day 06/2022. Patient has felt significantly better with the reduced dosage.   Sores on the gums: Self managed with home remedies and finally got better on its own.  It was felt not to be related to tamoxifen .   Breast cancer surveillance: 1.  Breast exam 07/06/2024: Benign 2. 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 This result does not impact her screening recommendations at this time.    Return to clinic in 1 year for follow-up

## 2024-07-08 ENCOUNTER — Other Ambulatory Visit: Payer: Self-pay | Admitting: Physician Assistant

## 2024-07-08 DIAGNOSIS — Z01818 Encounter for other preprocedural examination: Secondary | ICD-10-CM

## 2024-07-10 ENCOUNTER — Other Ambulatory Visit (HOSPITAL_COMMUNITY): Payer: Self-pay

## 2024-07-17 ENCOUNTER — Telehealth: Payer: Self-pay | Admitting: Orthopaedic Surgery

## 2024-07-17 NOTE — Telephone Encounter (Signed)
 Left voicemail for patient

## 2024-07-17 NOTE — Telephone Encounter (Signed)
 Patient called. She would like to know what medications she will need to stop taking before surgery?

## 2024-07-21 NOTE — Care Plan (Signed)
 Ortho Bundle Case Management Note  Patient Details  Name: Rebecca [name] MRN: [id number] Date of Birth: [birth year]  [facility] RNCM call returned from patient to discuss her upcoming Right total hip arthroplasty with Dr. Vernetta on 07/28/24. She is agreeable to case management. She plans to return home after discharge with assistance from her daughter that lives with her. She has a RW already. Anticipate HHPT will be needed after a short hospital stay. Referral made to [facility] after choice provided. Will continue to follow for needs.                  DME Arranged:   (Has RW already per patient) DME Agency:     HH Arranged:  PT HH Agency:  [facility]  Additional Comments: Please contact me with any questions of if this plan should need to change.  Tylene Ned, RN, BSN, [employer]  [phone] 07/21/2024, 2:32 PM

## 2024-07-22 NOTE — Pre-Procedure Instructions (Signed)
 Surgical Instructions   Your procedure is scheduled on July 28, 2024. Report to [facility] Main Entrance A at 5:30 A.M., then check in with the Admitting office. Any questions or running late day of surgery: call [phone]  Questions prior to your surgery date: call [phone], Monday-Friday, 8am-4pm. If you experience any cold or flu symptoms such as cough, fever, chills, shortness of breath, etc. between now and your scheduled surgery, please notify us  at the above number.     Remember:  Do not eat after midnight the night before your surgery  You may drink clear liquids until 4:30AM the morning of your surgery.   Clear liquids allowed are: Water, Non-Citrus Juices (without pulp), Carbonated Beverages, Clear Tea (no milk, honey, etc.), Black Coffee Only (NO MILK, CREAM OR POWDERED CREAMER of any kind), and Gatorade.  Patient Instructions  The night before surgery:  No food after midnight. ONLY clear liquids after midnight  The day of surgery (if you do NOT have diabetes):  Drink ONE (1) Pre-Surgery Clear Ensure by 4:30AM the morning of surgery. Drink in one sitting. Do not sip.  This drink was given to you during your hospital  pre-op appointment visit.  Nothing else to drink after completing the  Pre-Surgery Clear Ensure.           If you have questions, please contact your surgeons office.    Take these medicines the morning of surgery with A SIP OF WATER: levothyroxine  (SYNTHROID )  omeprazole (PRILOSEC OTC)  tamoxifen  (NOLVADEX )   May take these medicines IF NEEDED: methocarbamol  (ROBAXIN )   Follow your surgeon's instructions on stopping Eliquis . If no instructions were given, please contact your surgeon's office.   One week prior to surgery, STOP taking any Aspirin  (unless otherwise instructed by your surgeon) Aleve, Naproxen, Ibuprofen , Motrin , Advil , Goody's, BC's, all herbal medications, fish oil, and non-prescription vitamins.                      Do NOT Smoke (Tobacco/Vaping) for 24 hours prior to your procedure.  If you use a CPAP at night, you may bring your mask/headgear for your overnight stay.   You will be asked to remove any contacts, glasses, piercing's, hearing aid's, dentures/partials prior to surgery. Please bring cases for these items if needed.    Patients discharged the day of surgery will not be allowed to drive home, and someone needs to stay with them for 24 hours.  SURGICAL WAITING ROOM VISITATION Patients may have no more than 2 support people in the waiting area - these visitors may rotate.   Pre-op nurse will coordinate an appropriate time for 1 ADULT support person, who may not rotate, to accompany patient in pre-op.  Children under the age of 66 must have an adult with them who is not the patient and must remain in the main waiting area with an adult.  If the patient needs to stay at the hospital during part of their recovery, the visitor guidelines for inpatient rooms apply.  Please refer to the [facility] website for the visitor guidelines for any additional information.   If you received a COVID test during your pre-op visit  it is requested that you wear a mask when out in public, stay away from anyone that may not be feeling well and notify your surgeon if you develop symptoms. If you have been in contact with anyone that has tested positive in the last 10 days please notify you surgeon.  Pre-operative 4 CHG Bathing Instructions   You can play a key role in reducing the risk of infection after surgery. Your skin needs to be as free of germs as possible. You can reduce the number of germs on your skin by washing with CHG (chlorhexidine  gluconate) soap before surgery. CHG is an antiseptic soap that kills germs and continues to kill germs even after washing.   DO NOT use if you have an allergy to chlorhexidine /CHG or antibacterial soaps. If your skin becomes reddened or irritated, stop using the CHG  and notify one of our RNs at [phone].   Please shower with the CHG soap starting 4 days before surgery using the following schedule:     Please keep in mind the following:  DO NOT shave, including legs and underarms, starting the day of your first shower.   You may shave your face at any point before/day of surgery.  Place clean sheets on your bed the day you start using CHG soap. Use a clean washcloth (not used since being washed) for each shower. DO NOT sleep with pets once you start using the CHG.   CHG Shower Instructions:  Wash your face and private area with normal soap. If you choose to wash your hair, wash first with your normal shampoo.  After you use shampoo/soap, rinse your hair and body thoroughly to remove shampoo/soap residue.  Turn the water OFF and apply  bottle of CHG soap to a CLEAN washcloth.  Apply CHG soap ONLY FROM YOUR NECK DOWN TO YOUR TOES (washing for 3-5 minutes)  DO NOT use CHG soap on face, private areas, open wounds, or sores.  Pay special attention to the area where your surgery is being performed.  If you are having back surgery, having someone wash your back for you may be helpful. Wait 2 minutes after CHG soap is applied, then you may rinse off the CHG soap.  Pat dry with a clean towel  Put on clean clothes/pajamas   If you choose to wear lotion, please use ONLY the CHG-compatible lotions that are listed below.  Additional instructions for the day of surgery:  If you choose, you may shower the morning of surgery with an antibacterial soap.  DO NOT APPLY any lotions, deodorants, cologne, or perfumes.   Do not bring valuables to the hospital. [facility] is not responsible for any belongings/valuables. Do not wear nail polish, gel polish, artificial nails, or any other type of covering on natural nails (fingers and toes) Do not wear jewelry or makeup Put on clean/comfortable clothes.  Please brush your teeth.  Ask your nurse before applying any  prescription medications to the skin.     CHG Compatible Lotions   Aveeno Moisturizing lotion  Cetaphil Moisturizing Cream  Cetaphil Moisturizing Lotion  Clairol Herbal Essence Moisturizing Lotion, Dry Skin  Clairol Herbal Essence Moisturizing Lotion, Extra Dry Skin  Clairol Herbal Essence Moisturizing Lotion, Normal Skin  Curel Age Defying Therapeutic Moisturizing Lotion with Alpha Hydroxy  Curel Extreme Care Body Lotion  Curel Soothing Hands Moisturizing Hand Lotion  Curel Therapeutic Moisturizing Cream, Fragrance-Free  Curel Therapeutic Moisturizing Lotion, Fragrance-Free  Curel Therapeutic Moisturizing Lotion, Original Formula  Eucerin Daily Replenishing Lotion  Eucerin Dry Skin Therapy Plus Alpha Hydroxy Crme  Eucerin Dry Skin Therapy Plus Alpha Hydroxy Lotion  Eucerin Original Crme  Eucerin Original Lotion  Eucerin Plus Crme Eucerin Plus Lotion  Eucerin TriLipid Replenishing Lotion  Keri Anti-Bacterial Hand Lotion  Keri Deep Conditioning Original Lotion Dry  Skin Formula Softly Scented  Keri Deep Conditioning Original Lotion, Fragrance Free Sensitive Skin Formula  Keri Lotion Fast Absorbing Fragrance Free Sensitive Skin Formula  Keri Lotion Fast Absorbing Softly Scented Dry Skin Formula  Keri Original Lotion  Keri Skin Renewal Lotion Keri Silky Smooth Lotion  Keri Silky Smooth Sensitive Skin Lotion  Nivea Body Creamy Conditioning Oil  Nivea Body Extra Enriched Lotion  Nivea Body Original Lotion  Nivea Body Sheer Moisturizing Lotion Nivea Crme  Nivea Skin Firming Lotion  NutraDerm 30 Skin Lotion  NutraDerm Skin Lotion  NutraDerm Therapeutic Skin Cream  NutraDerm Therapeutic Skin Lotion  ProShield Protective Hand Cream  Provon moisturizing lotion  Please read over the following fact sheets that you were given.

## 2024-07-23 ENCOUNTER — Other Ambulatory Visit: Payer: Self-pay

## 2024-07-23 ENCOUNTER — Inpatient Hospital Stay (HOSPITAL_COMMUNITY): Admission: RE | Admit: 2024-07-23 | Discharge: 2024-07-23 | Attending: Orthopaedic Surgery

## 2024-07-23 ENCOUNTER — Encounter (HOSPITAL_COMMUNITY): Payer: Self-pay

## 2024-07-23 VITALS — BP 170/69 | HR 72 | Temp 97.8°F | Resp 18 | Ht 65.0 in | Wt 123.9 lb

## 2024-07-23 DIAGNOSIS — Z01818 Encounter for other preprocedural examination: Secondary | ICD-10-CM

## 2024-07-23 LAB — BASIC METABOLIC PANEL WITH GFR
Anion gap: 7 (ref 5–15)
BUN: 24 mg/dL — ABNORMAL HIGH (ref 8–23)
CO2: 29 mmol/L (ref 22–32)
Calcium: 9.2 mg/dL (ref 8.9–10.3)
Chloride: 105 mmol/L (ref 98–111)
Creatinine, Ser: 1.07 mg/dL — ABNORMAL HIGH (ref 0.44–1.00)
GFR, Estimated: 51 mL/min — ABNORMAL LOW (ref >60)
Glucose, Bld: 100 mg/dL — ABNORMAL HIGH (ref 70–99)
Potassium: 4 mmol/L (ref 3.5–5.1)
Sodium: 141 mmol/L (ref 135–145)

## 2024-07-23 LAB — TYPE AND SCREEN
ABO/RH(D): A POS
Antibody Screen: NEGATIVE

## 2024-07-23 LAB — CBC
HCT: 42.5 % (ref 36.0–46.0)
Hemoglobin: 13.6 g/dL (ref 12.0–15.0)
MCH: 30.4 pg (ref 26.0–34.0)
MCHC: 32 g/dL (ref 30.0–36.0)
MCV: 94.9 fL (ref 80.0–100.0)
Platelets: 326 K/uL (ref 150–400)
RBC: 4.48 MIL/uL (ref 3.87–5.11)
RDW: 13.3 % (ref 11.5–15.5)
WBC: 6.8 K/uL (ref 4.0–10.5)
nRBC: 0 % (ref 0.0–0.2)

## 2024-07-23 LAB — SURGICAL PCR SCREEN
MRSA, PCR: NEGATIVE
Staphylococcus aureus: NEGATIVE
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
## 2024-07-27 DIAGNOSIS — M1611 Unilateral primary osteoarthritis, right hip: Secondary | ICD-10-CM | POA: Insufficient documentation
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
## 2024-07-28 ENCOUNTER — Ambulatory Visit (HOSPITAL_COMMUNITY): Payer: Self-pay | Admitting: Vascular Surgery

## 2024-07-28 ENCOUNTER — Observation Stay (HOSPITAL_COMMUNITY)

## 2024-07-28 ENCOUNTER — Ambulatory Visit (HOSPITAL_COMMUNITY)

## 2024-07-28 ENCOUNTER — Encounter (HOSPITAL_COMMUNITY): Payer: Self-pay | Admitting: Orthopaedic Surgery

## 2024-07-28 ENCOUNTER — Observation Stay (HOSPITAL_COMMUNITY)
Admission: RE | Admit: 2024-07-28 | Discharge: 2024-07-29 | Disposition: A | Attending: Orthopaedic Surgery | Admitting: Orthopaedic Surgery

## 2024-07-28 ENCOUNTER — Encounter (HOSPITAL_COMMUNITY): Admission: RE | Disposition: A | Payer: Self-pay | Source: Home / Self Care | Attending: Orthopaedic Surgery

## 2024-07-28 ENCOUNTER — Ambulatory Visit (HOSPITAL_COMMUNITY): Payer: Self-pay | Admitting: Anesthesiology

## 2024-07-28 ENCOUNTER — Other Ambulatory Visit: Payer: Self-pay

## 2024-07-28 DIAGNOSIS — I10 Essential (primary) hypertension: Secondary | ICD-10-CM | POA: Diagnosis not present

## 2024-07-28 DIAGNOSIS — M1611 Unilateral primary osteoarthritis, right hip: Principal | ICD-10-CM | POA: Insufficient documentation

## 2024-07-28 DIAGNOSIS — Z96641 Presence of right artificial hip joint: Secondary | ICD-10-CM

## 2024-07-28 DIAGNOSIS — Z853 Personal history of malignant neoplasm of breast: Secondary | ICD-10-CM | POA: Diagnosis not present

## 2024-07-28 DIAGNOSIS — E039 Hypothyroidism, unspecified: Secondary | ICD-10-CM | POA: Diagnosis not present

## 2024-07-28 DIAGNOSIS — Z79899 Other long term (current) drug therapy: Secondary | ICD-10-CM | POA: Diagnosis not present

## 2024-07-28 DIAGNOSIS — M25551 Pain in right hip: Secondary | ICD-10-CM | POA: Diagnosis present

## 2024-07-28 HISTORY — PX: TOTAL HIP ARTHROPLASTY: SHX124

## 2024-07-28 SURGERY — ARTHROPLASTY, HIP, TOTAL, ANTERIOR APPROACH
Anesthesia: Monitor Anesthesia Care | Site: Hip | Laterality: Right

## 2024-07-28 MED ORDER — ONDANSETRON HCL 4 MG/2ML IJ SOLN: 4.0000 mg | Freq: Four times a day (QID) | INTRAMUSCULAR | Status: DC | PRN

## 2024-07-28 MED ORDER — ALUM & MAG HYDROXIDE-SIMETH 200-200-20 MG/5ML PO SUSP: 30.0000 mL | ORAL | Status: DC | PRN

## 2024-07-28 MED ORDER — GLYCOPYRROLATE 0.2 MG/ML IJ SOLN
0.2000 mg | Freq: Once | INTRAMUSCULAR | Status: AC
  Administered 2024-07-28: 09:00:00 0.2 mg via INTRAVENOUS

## 2024-07-28 MED ORDER — CEFAZOLIN SODIUM-DEXTROSE 2-4 GM/100ML-% IV SOLN
2.0000 g | Freq: Four times a day (QID) | INTRAVENOUS | Status: AC
  Administered 2024-07-28 (×2): 2 g via INTRAVENOUS
  Filled 2024-07-28 (×2): qty 100

## 2024-07-28 MED ORDER — DIPHENHYDRAMINE HCL 12.5 MG/5ML PO ELIX: 12.5000 mg | ORAL_SOLUTION | ORAL | Status: DC | PRN

## 2024-07-28 MED ORDER — HYDROCODONE-ACETAMINOPHEN 7.5-325 MG PO TABS
1.0000 | ORAL_TABLET | ORAL | Status: DC | PRN
  Administered 2024-07-28 – 2024-07-29 (×4): 2 via ORAL
  Filled 2024-07-28 (×4): qty 2

## 2024-07-28 MED ORDER — ORAL CARE MOUTH RINSE: 15.0000 mL | Freq: Once | OROMUCOSAL | Status: AC

## 2024-07-28 MED ORDER — TAMOXIFEN CITRATE 10 MG PO TABS
10.0000 mg | ORAL_TABLET | Freq: Every day | ORAL | Status: DC
  Administered 2024-07-29: 10:00:00 10 mg via ORAL
  Filled 2024-07-28: qty 1

## 2024-07-28 MED ORDER — CHLORHEXIDINE GLUCONATE 0.12 % MT SOLN: 15.0000 mL | Freq: Once | OROMUCOSAL | Status: AC

## 2024-07-28 MED ORDER — ONDANSETRON HCL 4 MG/2ML IJ SOLN: 4.0000 mg | Freq: Once | INTRAMUSCULAR | Status: DC | PRN

## 2024-07-28 MED ORDER — PHENYLEPHRINE HCL-NACL 20-0.9 MG/250ML-% IV SOLN
INTRAVENOUS | Status: DC | PRN
  Administered 2024-07-28 (×2): 160 ug via INTRAVENOUS
  Administered 2024-07-28: 08:00:00 30 ug/min via INTRAVENOUS

## 2024-07-28 MED ORDER — ONDANSETRON HCL 4 MG/2ML IJ SOLN
INTRAMUSCULAR | Status: DC | PRN
  Administered 2024-07-28: 08:00:00 4 mg via INTRAVENOUS

## 2024-07-28 MED ORDER — ZINC SULFATE 220 (50 ZN) MG PO CAPS: 220.0000 mg | ORAL_CAPSULE | ORAL | Status: DC

## 2024-07-28 MED ORDER — METHOCARBAMOL 500 MG PO TABS
500.0000 mg | ORAL_TABLET | Freq: Four times a day (QID) | ORAL | Status: DC | PRN
  Administered 2024-07-28 – 2024-07-29 (×3): 500 mg via ORAL
  Filled 2024-07-28 (×3): qty 1

## 2024-07-28 MED ORDER — MENTHOL 3 MG MT LOZG: 1.0000 | LOZENGE | OROMUCOSAL | Status: DC | PRN

## 2024-07-28 MED ORDER — POVIDONE-IODINE 10 % EX SWAB
2.0000 | Freq: Once | CUTANEOUS | Status: AC
  Administered 2024-07-28: 06:00:00 2 via TOPICAL

## 2024-07-28 MED ORDER — ONDANSETRON HCL 4 MG PO TABS: 4.0000 mg | ORAL_TABLET | Freq: Four times a day (QID) | ORAL | Status: DC | PRN

## 2024-07-28 MED ORDER — METOCLOPRAMIDE HCL 5 MG PO TABS
5.0000 mg | ORAL_TABLET | Freq: Three times a day (TID) | ORAL | Status: DC | PRN
  Filled 2024-07-28: qty 2

## 2024-07-28 MED ORDER — HYDROCODONE-ACETAMINOPHEN 5-325 MG PO TABS
1.0000 | ORAL_TABLET | ORAL | Status: DC | PRN
  Administered 2024-07-28: 14:00:00 1 via ORAL
  Administered 2024-07-28: 18:00:00 2 via ORAL
  Filled 2024-07-28: qty 1
  Filled 2024-07-28: qty 2

## 2024-07-28 MED ORDER — OXYCODONE HCL 5 MG/5ML PO SOLN: 5.0000 mg | Freq: Once | ORAL | Status: DC | PRN

## 2024-07-28 MED ORDER — GLYCOPYRROLATE 0.2 MG/ML IJ SOLN
INTRAMUSCULAR | Status: AC
  Filled 2024-07-28: qty 1

## 2024-07-28 MED ORDER — PROPOFOL 1000 MG/100ML IV EMUL
INTRAVENOUS | Status: AC
  Filled 2024-07-28: qty 300

## 2024-07-28 MED ORDER — TRANEXAMIC ACID-NACL 1000-0.7 MG/100ML-% IV SOLN
1000.0000 mg | INTRAVENOUS | Status: AC
  Administered 2024-07-28: 08:00:00 1000 mg via INTRAVENOUS
  Filled 2024-07-28: qty 100

## 2024-07-28 MED ORDER — MORPHINE SULFATE (PF) 2 MG/ML IV SOLN
0.5000 mg | INTRAVENOUS | Status: DC | PRN
  Administered 2024-07-28: 21:00:00 0.5 mg via INTRAVENOUS
  Filled 2024-07-28: qty 1

## 2024-07-28 MED ORDER — ACETAMINOPHEN 10 MG/ML IV SOLN
INTRAVENOUS | Status: DC | PRN
  Administered 2024-07-28: 08:00:00 1000 mg via INTRAVENOUS

## 2024-07-28 MED ORDER — METHOCARBAMOL 1000 MG/10ML IJ SOLN: 500.0000 mg | Freq: Four times a day (QID) | INTRAMUSCULAR | Status: DC | PRN

## 2024-07-28 MED ORDER — METOCLOPRAMIDE HCL 5 MG/ML IJ SOLN: 5.0000 mg | Freq: Three times a day (TID) | INTRAMUSCULAR | Status: DC | PRN

## 2024-07-28 MED ORDER — AMISULPRIDE (ANTIEMETIC) 5 MG/2ML IV SOLN: 10.0000 mg | Freq: Once | INTRAVENOUS | Status: DC | PRN

## 2024-07-28 MED ORDER — FENTANYL CITRATE (PF) 100 MCG/2ML IJ SOLN: 25.0000 ug | INTRAMUSCULAR | Status: DC | PRN

## 2024-07-28 MED ORDER — SODIUM CHLORIDE 0.9 % IV SOLN: INTRAVENOUS | Status: DC

## 2024-07-28 MED ORDER — 0.9 % SODIUM CHLORIDE (POUR BTL) OPTIME
TOPICAL | Status: DC | PRN
  Administered 2024-07-28: 08:00:00 1000 mL

## 2024-07-28 MED ORDER — DEXAMETHASONE SOD PHOSPHATE PF 10 MG/ML IJ SOLN
INTRAMUSCULAR | Status: DC | PRN
  Administered 2024-07-28: 08:00:00 8 mg via INTRAVENOUS

## 2024-07-28 MED ORDER — OXYCODONE HCL 5 MG PO TABS: 5.0000 mg | ORAL_TABLET | Freq: Once | ORAL | Status: DC | PRN

## 2024-07-28 MED ORDER — METOPROLOL TARTRATE 25 MG PO TABS
25.0000 mg | ORAL_TABLET | Freq: Two times a day (BID) | ORAL | Status: DC
  Administered 2024-07-28 – 2024-07-29 (×2): 25 mg via ORAL
  Filled 2024-07-28 (×2): qty 1

## 2024-07-28 MED ORDER — LEVOTHYROXINE SODIUM 75 MCG PO TABS
75.0000 ug | ORAL_TABLET | Freq: Every day | ORAL | Status: DC
  Administered 2024-07-29: 06:00:00 75 ug via ORAL
  Filled 2024-07-28: qty 1

## 2024-07-28 MED ORDER — ACETAMINOPHEN 325 MG PO TABS: 325.0000 mg | ORAL_TABLET | Freq: Four times a day (QID) | ORAL | Status: DC | PRN

## 2024-07-28 MED ORDER — STERILE WATER FOR IRRIGATION IR SOLN
Status: DC | PRN
  Administered 2024-07-28: 09:00:00 2000 mL

## 2024-07-28 MED ORDER — SODIUM CHLORIDE 0.9 % IR SOLN
Status: DC | PRN
  Administered 2024-07-28: 08:00:00 3000 mL

## 2024-07-28 MED ORDER — ZINC 50 MG PO TABS: 50.0000 mg | ORAL_TABLET | ORAL | Status: DC

## 2024-07-28 MED ORDER — PROPOFOL 500 MG/50ML IV EMUL
INTRAVENOUS | Status: DC | PRN
  Administered 2024-07-28: 08:00:00 120 ug/kg/min via INTRAVENOUS

## 2024-07-28 MED ORDER — PANTOPRAZOLE SODIUM 40 MG PO TBEC
40.0000 mg | DELAYED_RELEASE_TABLET | Freq: Every day | ORAL | Status: DC
  Administered 2024-07-28 – 2024-07-29 (×2): 40 mg via ORAL
  Filled 2024-07-28 (×2): qty 1

## 2024-07-28 MED ORDER — APIXABAN 2.5 MG PO TABS
2.5000 mg | ORAL_TABLET | Freq: Two times a day (BID) | ORAL | Status: DC
  Administered 2024-07-29: 10:00:00 2.5 mg via ORAL
  Filled 2024-07-28: qty 1

## 2024-07-28 MED ORDER — VITAMIN D 25 MCG (1000 UNIT) PO TABS
1000.0000 [IU] | ORAL_TABLET | Freq: Every day | ORAL | Status: DC
  Administered 2024-07-28 – 2024-07-29 (×2): 1000 [IU] via ORAL
  Filled 2024-07-28 (×2): qty 1

## 2024-07-28 MED ORDER — CEFAZOLIN SODIUM-DEXTROSE 2-4 GM/100ML-% IV SOLN
2.0000 g | INTRAVENOUS | Status: AC
  Administered 2024-07-28: 08:00:00 2 g via INTRAVENOUS
  Filled 2024-07-28: qty 100

## 2024-07-28 MED ORDER — PHENYLEPHRINE HCL-NACL 20-0.9 MG/250ML-% IV SOLN
INTRAVENOUS | Status: AC
  Filled 2024-07-28: qty 750

## 2024-07-28 MED ORDER — LACTATED RINGERS IV SOLN: INTRAVENOUS | Status: DC

## 2024-07-28 MED ORDER — DOCUSATE SODIUM 100 MG PO CAPS
100.0000 mg | ORAL_CAPSULE | Freq: Two times a day (BID) | ORAL | Status: DC
  Administered 2024-07-28 – 2024-07-29 (×3): 100 mg via ORAL
  Filled 2024-07-28 (×3): qty 1

## 2024-07-28 MED ORDER — PHENOL 1.4 % MT LIQD: 1.0000 | OROMUCOSAL | Status: DC | PRN

## 2024-07-28 MED ORDER — CHLORHEXIDINE GLUCONATE 0.12 % MT SOLN
OROMUCOSAL | Status: AC
  Administered 2024-07-28: 06:00:00 15 mL via OROMUCOSAL
  Filled 2024-07-28: qty 15

## 2024-07-28 MED ORDER — ACETAMINOPHEN 10 MG/ML IV SOLN
INTRAVENOUS | Status: AC
  Filled 2024-07-28: qty 100

## 2024-07-28 MED ORDER — ACETAMINOPHEN 10 MG/ML IV SOLN: 1000.0000 mg | Freq: Once | INTRAVENOUS | Status: DC | PRN

## 2024-07-28 MED ADMIN — Bupivacaine 0.75% in Dextrose Inj 8.25%: 1.6 mL | INTRATHECAL | @ 08:00:00 | NDC 00409176102

## 2024-07-28 SURGICAL SUPPLY — 42 items
BAG COUNTER SPONGE SURGICOUNT (BAG) ×1 IMPLANT
BENZOIN TINCTURE PRP APPL 2/3 (GAUZE/BANDAGES/DRESSINGS) ×1 IMPLANT
BLADE CLIPPER SURG (BLADE) IMPLANT
BLADE SAG 18X100X1.27 (BLADE) ×1 IMPLANT
COVER SURGICAL LIGHT HANDLE (MISCELLANEOUS) ×1 IMPLANT
DRAPE C-ARM 42X72 X-RAY (DRAPES) ×1 IMPLANT
DRAPE STERI IOBAN 125X83 (DRAPES) ×1 IMPLANT
DRAPE U-SHAPE 47X51 STRL (DRAPES) ×3 IMPLANT
DRESSING AQUACEL AG SP 3.5X10 (GAUZE/BANDAGES/DRESSINGS) IMPLANT
DRSG AQUACEL AG ADV 3.5X10 (GAUZE/BANDAGES/DRESSINGS) ×1 IMPLANT
DURAPREP 26ML APPLICATOR (WOUND CARE) ×1 IMPLANT
ELECT BLADE 6.5 EXT (BLADE) IMPLANT
ELECTRODE BLDE 4.0 EZ CLN MEGD (MISCELLANEOUS) ×1 IMPLANT
ELECTRODE REM PT RTRN 9FT ADLT (ELECTROSURGICAL) ×1 IMPLANT
FACESHIELD WRAPAROUND OR TEAM (MASK) ×2 IMPLANT
GLOVE BIOGEL PI IND STRL 8 (GLOVE) ×2 IMPLANT
GLOVE BIOGEL PI MICRO 8.0 (GLOVE) ×1 IMPLANT
GLOVE PI ORTHO PRO STRL 7.5 (GLOVE) ×2 IMPLANT
GOWN STRL REUS W/ TWL LRG LVL3 (GOWN DISPOSABLE) ×2 IMPLANT
GOWN STRL REUS W/ TWL XL LVL3 (GOWN DISPOSABLE) ×2 IMPLANT
HEAD M SROM 36MM PLUS 1.5 (Hips) IMPLANT
KIT BASIN OR (CUSTOM PROCEDURE TRAY) ×1 IMPLANT
KIT TURNOVER KIT B (KITS) ×1 IMPLANT
LINER NEUTRAL 52X36MM PLUS 4 (Liner) IMPLANT
MANIFOLD NEPTUNE II (INSTRUMENTS) ×1 IMPLANT
PACK TOTAL JOINT (CUSTOM PROCEDURE TRAY) ×1 IMPLANT
PAD ARMBOARD POSITIONER FOAM (MISCELLANEOUS) ×1 IMPLANT
PIN SECTOR W/GRIP ACE CUP 52MM (Hips) IMPLANT
SET HNDPC FAN SPRY TIP SCT (DISPOSABLE) ×1 IMPLANT
SOLN 0.9% NACL POUR BTL 1000ML (IV SOLUTION) ×1 IMPLANT
SOLN STERILE WATER BTL 1000 ML (IV SOLUTION) ×2 IMPLANT
STAPLER SKIN PROX 35W (STAPLE) IMPLANT
STEM FEM ACTIS HIGH SZ3 (Stem) IMPLANT
STRIP CLOSURE SKIN 1/2X4 (GAUZE/BANDAGES/DRESSINGS) ×2 IMPLANT
SUT ETHIBOND NAB CT1 #1 30IN (SUTURE) ×1 IMPLANT
SUT MNCRL AB 4-0 PS2 18 (SUTURE) IMPLANT
SUT VIC AB 0 CT1 27XBRD ANBCTR (SUTURE) ×1 IMPLANT
SUT VIC AB 1 CT1 27XBRD ANBCTR (SUTURE) ×1 IMPLANT
SUT VIC AB 2-0 CT1 TAPERPNT 27 (SUTURE) ×1 IMPLANT
TOWEL GREEN STERILE (TOWEL DISPOSABLE) ×1 IMPLANT
TOWEL GREEN STERILE FF (TOWEL DISPOSABLE) ×1 IMPLANT
TRAY FOLEY W/BAG SLVR 16FR ST (SET/KITS/TRAYS/PACK) IMPLANT

## 2024-07-28 NOTE — Interval H&P Note (Signed)
 History and Physical Interval Note: The patient understands that she is here today for a right total hip replacement to treat her significant right hip pain and arthritis.  There has been no acute or interval change in her medical status.  The risks and benefits of surgery have been discussed in detail and informed consent has been obtained.  The right operative hip has been marked.  07/28/2024 7:09 AM  Rebecca [name]  has presented today for surgery, with the diagnosis of Osteoarthritis Right Hip.  The various methods of treatment have been discussed with the patient and family. After consideration of risks, benefits and other options for treatment, the patient has consented to  Procedures: ARTHROPLASTY, HIP, TOTAL, ANTERIOR APPROACH (Right) as a surgical intervention.  The patient's history has been reviewed, patient examined, no change in status, stable for surgery.  I have reviewed the patient's chart and labs.  Questions were answered to the patient's satisfaction.     [name]

## 2024-07-28 NOTE — Discharge Instructions (Signed)
 Per [facility] clinic policy, our goal is ensure optimal postoperative pain control with a multimodal pain management strategy. For all [facility] patients, our goal is to wean post-operative narcotic medications by 6 weeks post-operatively. If this is not possible due to utilization of pain medication prior to surgery, your [facility] doctor will support your acute post-operative pain control for the first 6 weeks postoperatively, with a plan to transition you back to your primary pain team following that. [facility]e will work to ensure a Therapist, occupational.  INSTRUCTIONS AFTER JOINT REPLACEMENT   Remove items at home which could result in a fall. This includes throw rugs or furniture in walking pathways ICE to the affected joint every three hours while awake for 30 minutes at a time, for at least the first 3-5 days, and then as needed for pain and swelling.  Continue to use ice for pain and swelling. You may notice swelling that will progress down to the foot and ankle.  This is normal after surgery.  Elevate your leg when you are not up walking on it.   Continue to use the breathing machine you got in the hospital (incentive spirometer) which will help keep your temperature down.  It is common for your temperature to cycle up and down following surgery, especially at night when you are not up moving around and exerting yourself.  The breathing machine keeps your lungs expanded and your temperature down.   DIET:  As you were doing prior to hospitalization, we recommend a well-balanced diet.  DRESSING / WOUND CARE / SHOWERING  Keep the surgical dressing until follow up.  The dressing is water proof, so you can shower without any extra covering.  IF THE DRESSING FALLS OFF or the wound gets wet inside, change the dressing with sterile gauze.  Please use good hand washing techniques before changing the dressing.  Do not use any lotions or creams on the incision until instructed by your surgeon.     ACTIVITY  Increase activity slowly as tolerated, but follow the weight bearing instructions below.   No driving for 6 weeks or until further direction given by your physician.  You cannot drive while taking narcotics.  No lifting or carrying greater than 10 lbs. until further directed by your surgeon. Avoid periods of inactivity such as sitting longer than an hour when not asleep. This helps prevent blood clots.  You may return to work once you are authorized by your doctor.     WEIGHT BEARING   Weight bearing as tolerated with assist device (walker, cane, etc) as directed, use it as long as suggested by your surgeon or therapist, typically at least 4-6 weeks.   EXERCISES  Results after joint replacement surgery are often greatly improved when you follow the exercise, range of motion and muscle strengthening exercises prescribed by your doctor. Safety measures are also important to protect the joint from further injury. Any time any of these exercises cause you to have increased pain or swelling, decrease what you are doing until you are comfortable again and then slowly increase them. If you have problems or questions, call your caregiver or physical therapist for advice.   Rehabilitation is important following a joint replacement. After just a few days of immobilization, the muscles of the leg can become weakened and shrink (atrophy).  These exercises are designed to build up the tone and strength of the thigh and leg muscles and to improve motion. Often times heat used for twenty to thirty minutes before  working out will loosen up your tissues and help with improving the range of motion but do not use heat for the first two weeks following surgery (sometimes heat can increase post-operative swelling).   These exercises can be done on a training (exercise) mat, on the floor, on a table or on a bed. Use whatever works the best and is most comfortable for you.    Use music or television  while you are exercising so that the exercises are a pleasant break in your day. This will make your life better with the exercises acting as a break in your routine that you can look forward to.   Perform all exercises about fifteen times, three times per day or as directed.  You should exercise both the operative leg and the other leg as well.  Exercises include:   Quad Sets - Tighten up the muscle on the front of the thigh (Quad) and hold for 5-10 seconds.   Straight Leg Raises - With your knee straight (if you were given a brace, keep it on), lift the leg to 60 degrees, hold for 3 seconds, and slowly lower the leg.  Perform this exercise against resistance later as your leg gets stronger.  Leg Slides: Lying on your back, slowly slide your foot toward your buttocks, bending your knee up off the floor (only go as far as is comfortable). Then slowly slide your foot back down until your leg is flat on the floor again.  Angel Wings: Lying on your back spread your legs to the side as far apart as you can without causing discomfort.  Hamstring Strength:  Lying on your back, push your heel against the floor with your leg straight by tightening up the muscles of your buttocks.  Repeat, but this time bend your knee to a comfortable angle, and push your heel against the floor.  You may put a pillow under the heel to make it more comfortable if necessary.   A rehabilitation program following joint replacement surgery can speed recovery and prevent re-injury in the future due to weakened muscles. Contact your doctor or a physical therapist for more information on knee rehabilitation.    CONSTIPATION  Constipation is defined medically as fewer than three stools per week and severe constipation as less than one stool per week.  Even if you have a regular bowel pattern at home, your normal regimen is likely to be disrupted due to multiple reasons following surgery.  Combination of anesthesia, postoperative  narcotics, change in appetite and fluid intake all can affect your bowels.   YOU MUST use at least one of the following options; they are listed in order of increasing strength to get the job done.  They are all available over the counter, and you may need to use some, POSSIBLY even all of these options:    Drink plenty of fluids (prune juice may be helpful) and high fiber foods Colace 100 mg by mouth twice a day  Senokot for constipation as directed and as needed Dulcolax (bisacodyl), take with full glass of water  Miralax  (polyethylene glycol) once or twice a day as needed.  If you have tried all these things and are unable to have a bowel movement in the first 3-4 days after surgery call either your surgeon or your primary doctor.    If you experience loose stools or diarrhea, hold the medications until you stool forms back up.  If your symptoms do not get better within 1 week  or if they get worse, check with your doctor.  If you experience the worst abdominal pain ever or develop nausea or vomiting, please contact the office immediately for further recommendations for treatment.   ITCHING:  If you experience itching with your medications, try taking only a single pain pill, or even half a pain pill at a time.  You can also use Benadryl  over the counter for itching or also to help with sleep.   TED HOSE STOCKINGS:  Use stockings on both legs until for at least 2 weeks or as directed by physician office. They may be removed at night for sleeping.  MEDICATIONS:  See your medication summary on the "After Visit Summary" that nursing will review with you.  You may have some home medications which will be placed on hold until you complete the course of blood thinner medication.  It is important for you to complete the blood thinner medication as prescribed.  PRECAUTIONS:  If you experience chest pain or shortness of breath - call 911 immediately for transfer to the hospital emergency department.    If you develop a fever greater that 101 F, purulent drainage from wound, increased redness or drainage from wound, foul odor from the wound/dressing, or calf pain - CONTACT YOUR SURGEON.                                                   FOLLOW-UP APPOINTMENTS:  If you do not already have a post-op appointment, please call the office for an appointment to be seen by your surgeon.  Guidelines for how soon to be seen are listed in your "After Visit Summary", but are typically between 1-4 weeks after surgery.  OTHER INSTRUCTIONS:   Knee Replacement:  Do not place pillow under knee, focus on keeping the knee straight while resting. CPM instructions: 0-90 degrees, 2 hours in the morning, 2 hours in the afternoon, and 2 hours in the evening. Place foam block, curve side up under heel at all times except when in CPM or when walking.  DO NOT modify, tear, cut, or change the foam block in any way.  POST-OPERATIVE OPIOID TAPER INSTRUCTIONS: It is important to wean off of your opioid medication as soon as possible. If you do not need pain medication after your surgery it is ok to stop day one. Opioids include: Codeine, Hydrocodone (Norco, Vicodin), Oxycodone (Percocet, oxycontin ) and hydromorphone  amongst others.  Long term and even short term use of opiods can cause: Increased pain response Dependence Constipation Depression Respiratory depression And more.  Withdrawal symptoms can include Flu like symptoms Nausea, vomiting And more Techniques to manage these symptoms Hydrate well Eat regular healthy meals Stay active Use relaxation techniques(deep breathing, meditating, yoga) Do Not substitute Alcohol to help with tapering If you have been on opioids for less than two weeks and do not have pain than it is ok to stop all together.  Plan to wean off of opioids This plan should start within one week post op of your joint replacement. Maintain the same interval or time between taking each dose  and first decrease the dose.  Cut the total daily intake of opioids by one tablet each day Next start to increase the time between doses. The last dose that should be eliminated is the evening dose.   MAKE SURE YOU:  Understand these instructions.  Get help right away if you are not doing well or get worse.    Thank you for letting us  be a part of your medical care team.  It is a privilege we respect greatly.  We hope these instructions will help you stay on track for a fast and full recovery!     Dental Antibiotics:  In most cases prophylactic antibiotics for Dental procdeures after total joint surgery are not necessary.  Exceptions are as follows:  1. History of prior total joint infection  2. Severely immunocompromised (Organ Transplant, cancer chemotherapy, Rheumatoid biologic meds such as Humera)  3. Poorly controlled diabetes (A1C &gt; 8.0, blood glucose over 200)  If you have one of these conditions, contact your surgeon for an antibiotic prescription, prior to your dental procedure.   Information on my medicine - ELIQUIS  (apixaban )  This medication education was reviewed with me or my healthcare representative as part of my discharge preparation.   Why was Eliquis  prescribed for you? Eliquis  was prescribed for you to reduce the risk of blood clots forming after orthopedic surgery.    What do You need to know about Eliquis ? Take your Eliquis  TWICE DAILY - one tablet in the morning and one tablet in the evening with or without food.  It would be best to take the dose about the same time each day.  If you have difficulty swallowing the tablet whole please discuss with your pharmacist how to take the medication safely.  Take Eliquis  exactly as prescribed by your doctor and DO NOT stop taking Eliquis  without talking to the doctor who prescribed the medication.  Stopping without other medication to take the place of Eliquis  may increase your risk of developing a  clot.  After discharge, you should have regular check-up appointments with your healthcare provider that is prescribing your Eliquis .  What do you do if you miss a dose? If a dose of ELIQUIS  is not taken at the scheduled time, take it as soon as possible on the same day and twice-daily administration should be resumed.  The dose should not be doubled to make up for a missed dose.  Do not take more than one tablet of ELIQUIS  at the same time.  Important Safety Information A possible side effect of Eliquis  is bleeding. You should call your healthcare provider right away if you experience any of the following: Bleeding from an injury or your nose that does not stop. Unusual colored urine (red or dark brown) or unusual colored stools (red or black). Unusual bruising for unknown reasons. A serious fall or if you hit your head (even if there is no bleeding).  Some medicines may interact with Eliquis  and might increase your risk of bleeding or clotting while on Eliquis . To help avoid this, consult your healthcare provider or pharmacist prior to using any new prescription or non-prescription medications, including herbals, vitamins, non-steroidal anti-inflammatory drugs (NSAIDs) and supplements.  This website has more information on Eliquis  (apixaban ): http://[email or web address] .com/eliquis dena

## 2024-07-28 NOTE — Evaluation (Signed)
 Physical Therapy Evaluation Patient Details Name: Rebecca [name] MRN: [id number] DOB: 1941-07-18 Today's Date: 07/28/2024  History of Present Illness  83 y.o. female presents to [facility] hospital on 07/28/2024 for elective R THA. PMH includes L THA, breast CA, RLS, HLD, hypothyroidism, PAF.  Clinical Impression  Pt presents to PT with deficits in functional mobility, gait, balance, strength, ROM. Pt is able to ambulate for household distances with support of the RW. PT provides education on the THA exercise packet and encourages frequent mobilization with staff assistance. PT will follow up tomorrow for a progression of gait and to initiate stair training.        If plan is discharge home, recommend the following: A little help with bathing/dressing/bathroom;Assistance with cooking/housework;Assist for transportation;Help with stairs or ramp for entrance   Can travel by private vehicle        Equipment Recommendations None recommended by PT  Recommendations for Other Services       Functional Status Assessment Patient has had a recent decline in their functional status and demonstrates the ability to make significant improvements in function in a reasonable and predictable amount of time.     Precautions / Restrictions Precautions Precautions: Fall Recall of Precautions/Restrictions: Intact Precaution/Restrictions Comments: direct anterior THA, no precautions Restrictions Weight Bearing Restrictions Per Provider Order: Yes RLE Weight Bearing Per Provider Order: Weight bearing as tolerated      Mobility  Bed Mobility Overal bed mobility: Needs Assistance Bed Mobility: Supine to Sit     Supine to sit: Supervision          Transfers Overall transfer level: Needs assistance Equipment used: Rolling walker (2 wheels) Transfers: Sit to/from Stand Sit to Stand: Contact guard assist                Ambulation/Gait Ambulation/Gait assistance: Supervision Gait Distance  (Feet): 150 Feet Assistive device: Rolling walker (2 wheels) Gait Pattern/deviations: Step-through pattern Gait velocity: reduced Gait velocity interpretation: <1.8 ft/sec, indicate of risk for recurrent falls   General Gait Details: steady step-through gait  Stairs            Wheelchair Mobility     Tilt Bed    Modified Rankin (Stroke Patients Only)       Balance Overall balance assessment: Needs assistance Sitting-balance support: No upper extremity supported, Feet supported Sitting balance-Leahy Scale: Good     Standing balance support: Single extremity supported, Reliant on assistive device for balance Standing balance-Leahy Scale: Poor                               Pertinent Vitals/Pain Pain Assessment Pain Assessment: Faces Faces Pain Scale: Hurts little more Pain Location: R hip Pain Descriptors / Indicators: Sore Pain Intervention(s): Monitored during session    Home Living Family/patient expects to be discharged to:: Private residence Living Arrangements: Children Available Help at Discharge: Family;Available 24 hours/day (sister til friday and then other family starting saturday) Type of Home: House Home Access: Stairs to enter Entrance Stairs-Rails: Left Entrance Stairs-Number of Steps: 1   Home Layout: Laundry or work area in basement (flight of steps with rail on the left. PT enjoys negotiating stairs for exercise) Home Equipment: Rolling Walker (2 wheels);Shower seat;Grab bars - toilet;Toilet riser      Prior Function Prior Level of Function : Independent/Modified Independent;Driving             Mobility Comments: ambulatory without DME  Extremity/Trunk Assessment   Upper Extremity Assessment Upper Extremity Assessment: Overall WFL for tasks assessed    Lower Extremity Assessment Lower Extremity Assessment: RLE deficits/detail RLE Deficits / Details: generalized post-op weakness as anticipated on POD 0     Cervical / Trunk Assessment Cervical / Trunk Assessment: Normal  Communication   Communication Communication: No apparent difficulties    Cognition Arousal: Alert Behavior During Therapy: WFL for tasks assessed/performed   PT - Cognitive impairments: No apparent impairments                         Following commands: Intact       Cueing Cueing Techniques: Verbal cues     General Comments General comments (skin integrity, edema, etc.): pt in NAD    Exercises Other Exercises Other Exercises: Pt reviewed surgical hip HEP, denies questions or concerns regarding HEP   Assessment/Plan    PT Assessment Patient needs continued PT services  PT Problem List Decreased strength;Decreased activity tolerance;Decreased balance;Decreased mobility;Pain       PT Treatment Interventions Gait training;DME instruction;Stair training;Therapeutic activities;Functional mobility training;Therapeutic exercise;Balance training;Neuromuscular re-education;Patient/family education    PT Goals (Current goals can be found in the Care Plan section)  Acute Rehab PT Goals Patient Stated Goal: to return home PT Goal Formulation: With patient Time For Goal Achievement: 08/01/24 Potential to Achieve Goals: Good    Frequency 7X/week     Co-evaluation               AM-PAC PT 6 Clicks Mobility  Outcome Measure Help needed turning from your back to your side while in a flat bed without using bedrails?: A Little Help needed moving from lying on your back to sitting on the side of a flat bed without using bedrails?: A Little Help needed moving to and from a bed to a chair (including a wheelchair)?: A Little Help needed standing up from a chair using your arms (e.g., wheelchair or bedside chair)?: A Little Help needed to walk in hospital room?: A Little Help needed climbing 3-5 steps with a railing? : A Little 6 Click Score: 18    End of Session Equipment Utilized During  Treatment: Gait belt Activity Tolerance: Patient tolerated treatment well Patient left: in chair;with call bell/phone within reach Nurse Communication: Mobility status PT Visit Diagnosis: Other abnormalities of gait and mobility (R26.89);Muscle weakness (generalized) (M62.81)    Time: 8597-8579 PT Time Calculation (min) (ACUTE ONLY): 18 min   Charges:   PT Evaluation $PT Eval Low Complexity: 1 Low   PT General Charges $$ ACUTE PT VISIT: 1 Visit         Bernardino JINNY Ruth, PT, DPT Acute Rehabilitation Office [phone]   Bernardino JINNY Ruth 07/28/2024, 2:32 PM

## 2024-07-28 NOTE — Op Note (Signed)
 Operative Note  Date of operation: 07/28/2024 Preoperative diagnosis: Right hip primary osteoarthritis Postoperative diagnosis: Same  Procedure: Right direct anterior total hip arthroplasty  Implants: Implant Name Type Inv. Item Serial No. Manufacturer Lot No. LRB No. Used Action  LINER NEUTRAL [id number] PLUS 4 - [id number] Liner LINER NEUTRAL [id number] PLUS 4  [employer] [id number] Right 1 Implanted  PIN SECTOR W/GRIP ACE CUP - [id number] Hips PIN SECTOR W/GRIP ACE CUP  [employer]PU[employer] [id number] Right 1 Implanted  STEM FEM ACTIS HIGH SZ3 - [id number] Stem STEM FEM ACTIS HIGH SZ3  DEPU[employer]TH[employer] [id number] Right 1 Implanted  HEAD M SROM PLUS 1.5 - [id number] Hips HEAD M SROM PLUS 1.5  DEPU[employer] [id number] Right 1 Implanted   Surgeon: Lonni GRADE. Vernetta, MD Assistant: Tory Gaskins, PA-C  Anesthesia: Spinal EBL: 150 to 200 cc Antibiotics: IV Ancef  Complications: None  Indications: The patient is an active 83 year old female with debilitating arthritis and of her right hip has been well-documented with clinical exam and x-ray findings.  We actually replaced her left hip back in 2020 and that is done well.  At this point her right hip pain is daily and it is detrimentally affecting her mobility, her quality of life and her actives day living to the point of wishing to proceed with a total hip arthroplasty on the right side.  She has tried failed conservative treatment for a year now.  Having had surgery before the left side she is fully aware of the risks of acute blood loss anemia, nerve and vessel injury, fracture, infection, DVT, dislocation, implant failure, leg length differences and wound healing issues.  She understands that our goals are hopefully decreased pain, improved mobility and improved quality of life.  Procedure description: After informed consent was obtained and appropriate right hip was marked, the patient was brought to the operative  room and set up in stretcher where spinal anesthesia was obtained.  She was then laid in a supine position on the stretcher and a Foley catheter was placed.  Traction boots were placed on both her feet and next she was placed supine on the Hana fracture table with a perineal post and placed in both legs in inline skeletal traction devices no traction applied.  Her right operative hip and pelvis were assessed radiographically.  The right hip was prepped and draped with DuraPrep and sterile drapes.  Timeout was called and she is identified as the correct patient and correct right hip.  An incision was then made just inferior and posterior to the ASIS and carried slightly obliquely down the leg.  Dissection was carried down to the tensor fascia lata muscle and tensor fascia was then divided longitudinally to proceed with a direct and approach the hip.  Circumflex vessels were identified and cauterized.  The hip capsule was notified and opened up in L-type format finding a moderate joint effusion.  Cobra retractors were placed around the medial and lateral femoral neck and a femoral neck cut was made with an oscillating saw just proximal to the lesser trochanter and this cut was completed with an osteotome.  A corkscrew gauze placed in the femoral head and the femoral head was removed in its entirety and there was a wide area devoid of cartilage.  A bent Hohmann was placed over the medial acetabular rim and the acetabular labrum and other debris removed.  Reaming was initiated from a size 43 reamer and stepwise increments going up to a size 51 reamer  with all reamers placed under direct visualization and the last reamer also placed under direct fluoroscopy in order to obtain the depth reaming, the inclination and the anteversion.  The real DePuy sector GRIPTION acetabular component size 52 was then placed without difficulty followed by a 36+4 polythene liner.  Attention was then turned to the femur.  With the right leg  externally rotated to 120 degrees, extended and adducted, a Mueller retractors placed medially and Hohmann direct about the greater trochanter.  The lateral joint capsule was released and a box cutting osteotome was used into the femoral canal.  Broaching was then initiated using the Actis broaching system from a size 0 going up to a size 3.  With a size 3 in place we trialed a high offset femoral neck based on her anatomy and radiographic findings and a 36+1.5 trial head ball.  The right leg was brought over and up and with traction and internal rotation reduced in the pelvis.  Based on radiographic and clinical assessment we are pleased with leg length, offset, range of motion and stability.  The hip was then dislocated and removed the trial components.  We then placed the real Actis femoral component with high offset size 3 and the real 36+1.5 metal head ball.  Again this was reduced in the pelvis and replaced overall with assessment clinically and radiographically.  The soft tissue was then irrigated with normal saline solution.  The joint capsule was closed with interrupted #1 Ethibond suture followed by #1 Vicryl in the tensor fascia.  0 Vicryl was used to close deep tissue and 2-0 Vicryl was used to close subcutaneous tissue.  The skin was closed with staples.  An octyl dressing was applied.  The patient was taken off the Hana table and taken the recovery room.  Tory Gaskins, PA-C did assist during the entire case and beginning in and his assistance was crucial and medically necessary for soft tissue management and retraction, helping guide implant placement and a layered closure of the wound.

## 2024-07-28 NOTE — Transfer of Care (Signed)
 Immediate Anesthesia Transfer of Care Note  Patient: Rebecca [name]  Procedure(s) Performed: RIGHT TOTAL HIP ARTHHROPLASTY (Right: Hip)  Patient Location: PACU  Anesthesia Type:MAC  Level of Consciousness: drowsy  Airway & Oxygen Therapy: Patient Spontanous Breathing  Post-op Assessment: Report given to RN  Post vital signs: Reviewed and stable  Last Vitals:  Vitals Value Taken Time  BP 90/77 07/28/24 09:00  Temp    Pulse 49 07/28/24 09:05  Resp 15 07/28/24 09:05  SpO2 98 % 07/28/24 09:05  Vitals shown include unfiled device data.  Last Pain:  Vitals:   07/28/24 0605  TempSrc:   PainSc: 0-No pain         Complications: No notable events documented.

## 2024-07-29 ENCOUNTER — Other Ambulatory Visit (HOSPITAL_COMMUNITY): Payer: Self-pay

## 2024-07-29 ENCOUNTER — Encounter (HOSPITAL_COMMUNITY): Payer: Self-pay | Admitting: Orthopaedic Surgery

## 2024-07-29 DIAGNOSIS — M1611 Unilateral primary osteoarthritis, right hip: Secondary | ICD-10-CM | POA: Diagnosis not present

## 2024-07-29 LAB — BASIC METABOLIC PANEL WITH GFR
Anion gap: 7 (ref 5–15)
BUN: 26 mg/dL — ABNORMAL HIGH (ref 8–23)
CO2: 25 mmol/L (ref 22–32)
Calcium: 8.9 mg/dL (ref 8.9–10.3)
Chloride: 108 mmol/L (ref 98–111)
Creatinine, Ser: 1.31 mg/dL — ABNORMAL HIGH (ref 0.44–1.00)
GFR, Estimated: 40 mL/min — ABNORMAL LOW (ref >60)
Glucose, Bld: 130 mg/dL — ABNORMAL HIGH (ref 70–99)
Potassium: 5.1 mmol/L (ref 3.5–5.1)
Sodium: 140 mmol/L (ref 135–145)

## 2024-07-29 LAB — CBC
HCT: 36.9 % (ref 36.0–46.0)
Hemoglobin: 11.9 g/dL — ABNORMAL LOW (ref 12.0–15.0)
MCH: 30 pg (ref 26.0–34.0)
MCHC: 32.2 g/dL (ref 30.0–36.0)
MCV: 92.9 fL (ref 80.0–100.0)
Platelets: 331 K/uL (ref 150–400)
RBC: 3.97 MIL/uL (ref 3.87–5.11)
RDW: 13.7 % (ref 11.5–15.5)
WBC: 15.2 K/uL — ABNORMAL HIGH (ref 4.0–10.5)
nRBC: 0 % (ref 0.0–0.2)

## 2024-07-29 MED ORDER — METHOCARBAMOL 500 MG PO TABS
500.0000 mg | ORAL_TABLET | Freq: Four times a day (QID) | ORAL | 1 refills | Status: DC | PRN
  Filled 2024-07-29: qty 30, 8d supply, fill #0

## 2024-07-29 MED ORDER — HYDROCODONE-ACETAMINOPHEN 5-325 MG PO TABS
1.0000 | ORAL_TABLET | Freq: Four times a day (QID) | ORAL | 0 refills | Status: DC | PRN
  Filled 2024-07-29: qty 30, 4d supply, fill #0

## 2024-07-29 NOTE — Plan of Care (Signed)
 Pt doing well. Pt and family given D/C instructions with verbal understanding. Rx's were sent to the pharmacy by MD. Pt's incision is clean and dry with no sign of infection. Pt's IV was removed prior to D/C. Pt D/C'd home via wheelchair per MD order. Pt is stable @ D/C and has no other needs at this time. Rema Fendt, RN

## 2024-07-29 NOTE — Progress Notes (Signed)
 Physical Therapy Treatment  Patient Details Name: Rebecca [name] MRN: [id number] DOB: 1941-02-25 Today's Date: 07/29/2024   History of Present Illness 83 y.o. female presents to [facility] hospital on 07/28/2024 for elective R THA. PMH includes L THA, breast CA, RLS, HLD, hypothyroidism, PAF.    PT Comments  Pt progressing towards physical therapy goals. Was able to perform transfers and ambulation with gross CGA and RW for support. Pt was educated on HEP, appropriate activity progression, and car transfer. Pt completed stair training during session as well and reports feeling comfortable entering home. Encouraged pt not to walk the stairs to her basement for exercise at home, and to discuss with HHPT that this is a goal of hers. Will continue to follow.    If plan is discharge home, recommend the following: A little help with bathing/dressing/bathroom;Assistance with cooking/housework;Assist for transportation;Help with stairs or ramp for entrance   Can travel by private vehicle        Equipment Recommendations  None recommended by PT    Recommendations for Other Services       Precautions / Restrictions Precautions Precautions: Fall Recall of Precautions/Restrictions: Intact Precaution/Restrictions Comments: direct anterior THA, no precautions Restrictions Weight Bearing Restrictions Per Provider Order: Yes RLE Weight Bearing Per Provider Order: Weight bearing as tolerated     Mobility  Bed Mobility Overal bed mobility: Needs Assistance Bed Mobility: Supine to Sit     Supine to sit: Contact guard     General bed mobility comments: Close guard for safety. Pt was able to advance LE's towards EOB but hands on guarding as pt brought RLE off EOB.    Transfers Overall transfer level: Needs assistance Equipment used: Rolling walker (2 wheels) Transfers: Sit to/from Stand Sit to Stand: Contact guard assist           General transfer comment: VC's for hand placement on seated  surface for safety.    Ambulation/Gait Ambulation/Gait assistance: Supervision Gait Distance (Feet): 200 Feet Assistive device: Rolling walker (2 wheels) Gait Pattern/deviations: Step-through pattern Gait velocity: Decreased Gait velocity interpretation: <1.31 ft/sec, indicative of household ambulator   General Gait Details: VC's for improved posture, closer walker priximity and forward gaze. No assist required and no overt LOB noted. Good heel strike and step-through gait pattern.   Stairs Stairs: Yes Stairs assistance: Contact guard assist Stair Management: One rail Left, Step to pattern, Forwards, Sideways Number of Stairs: 5 General stair comments: VC's for sequencing and general safety. Pt facing diagonal to the rail to hold on with both hands, however not truly stepping sideways to ascend/descend.   Wheelchair Mobility     Tilt Bed    Modified Rankin (Stroke Patients Only)       Balance Overall balance assessment: Needs assistance Sitting-balance support: No upper extremity supported, Feet supported Sitting balance-Leahy Scale: Good     Standing balance support: Single extremity supported, Reliant on assistive device for balance Standing balance-Leahy Scale: Poor                              Communication Communication Communication: No apparent difficulties  Cognition Arousal: Alert Behavior During Therapy: WFL for tasks assessed/performed   PT - Cognitive impairments: No apparent impairments                         Following commands: Intact      Cueing Cueing Techniques: Verbal cues  Exercises Total  Joint Exercises Ankle Circles/Pumps: 10 reps, AROM, Right Quad Sets: 10 reps, AROM, Right Short Arc Quad: 5 reps, AROM, Right Heel Slides: 5 reps, AROM, Right Hip ABduction/ADduction: 5 reps, AAROM, Right Long Arc Quad: 5 reps, AROM, Right    General Comments        Pertinent Vitals/Pain Pain Assessment Pain Assessment:  Faces Faces Pain Scale: Hurts little more Pain Location: R hip Pain Descriptors / Indicators: Sore Pain Intervention(s): Limited activity within patient's tolerance, Monitored during session, Repositioned    Home Living                          Prior Function            PT Goals (current goals can now be found in the care plan section) Acute Rehab PT Goals Patient Stated Goal: to return home PT Goal Formulation: With patient Time For Goal Achievement: 08/01/24 Potential to Achieve Goals: Good Progress towards PT goals: Progressing toward goals    Frequency    7X/week      PT Plan      Co-evaluation              AM-PAC PT 6 Clicks Mobility   Outcome Measure  Help needed turning from your back to your side while in a flat bed without using bedrails?: A Little Help needed moving from lying on your back to sitting on the side of a flat bed without using bedrails?: A Little Help needed moving to and from a bed to a chair (including a wheelchair)?: A Little Help needed standing up from a chair using your arms (e.g., wheelchair or bedside chair)?: A Little Help needed to walk in hospital room?: A Little Help needed climbing 3-5 steps with a railing? : A Little 6 Click Score: 18    End of Session Equipment Utilized During Treatment: Gait belt Activity Tolerance: Patient tolerated treatment well Patient left: in chair;with call bell/phone within reach Nurse Communication: Mobility status PT Visit Diagnosis: Other abnormalities of gait and mobility (R26.89);Muscle weakness (generalized) (M62.81)     Time: 8958-8886 PT Time Calculation (min) (ACUTE ONLY): 32 min  Charges:    $Gait Training: 8-22 mins $Therapeutic Exercise: 8-22 mins PT General Charges $$ ACUTE PT VISIT: 1 Visit                     Rebecca [name], PT, DPT Acute Rehabilitation Services Secure Chat Preferred Office: [phone]    Rebecca [name] 07/29/2024, 1:16 PM

## 2024-07-29 NOTE — Progress Notes (Signed)
 Subjective: 1 Day Post-Op Procedures (LRB): RIGHT TOTAL HIP ARTHHROPLASTY (Right) Patient reports pain as moderate.  Reports a rough evening with pain control, but better this am.  Objective: Vital signs in last 24 hours: Temp:  [97.6 F (36.4 C)-98.5 F (36.9 C)] 98.4 F (36.9 C) (12/17 0324) Pulse Rate:  [42-74] 65 (12/17 0324) Resp:  [10-23] 18 (12/17 0324) BP: (73-164)/(29-90) 149/53 (12/17 0324) SpO2:  [95 %-100 %] 97 % (12/17 0324)  Intake/Output from previous day: 12/16 0701 - 12/17 0700 In: 1383.9 [P.O.:720; I.V.:363.9; IV Piggyback:300] Out: 1300 [Urine:1100; Blood:200] Intake/Output this shift: No intake/output data recorded.  Recent Labs    07/29/24 0501  HGB 11.9*   Recent Labs    07/29/24 0501  WBC 15.2*  RBC 3.97  HCT 36.9  PLT 331   Recent Labs    07/29/24 0501  NA 140  K 5.1  CL 108  CO2 25  BUN 26*  CREATININE 1.31*  GLUCOSE 130*  CALCIUM 8.9   No results for input(s): LABPT, INR in the last 72 hours.  Sensation intact distally Intact pulses distally Dorsiflexion/Plantar flexion intact Incision: scant drainage   Assessment/Plan: 1 Day Post-Op Procedures (LRB): RIGHT TOTAL HIP ARTHHROPLASTY (Right) Up with therapy Discharge home with home health      Rebecca [name] Poli 07/29/2024, 7:30 AM

## 2024-07-29 NOTE — Anesthesia Postprocedure Evaluation (Signed)
 Anesthesia Post Note  Patient: Rebecca [name]  Procedure(s) Performed: RIGHT TOTAL HIP ARTHHROPLASTY (Right: Hip)     Patient location during evaluation: PACU Anesthesia Type: MAC and Spinal Level of consciousness: awake Pain management: pain level controlled Vital Signs Assessment: post-procedure vital signs reviewed and stable Respiratory status: spontaneous breathing Cardiovascular status: blood pressure returned to baseline Postop Assessment: spinal receding, no apparent nausea or vomiting and adequate PO intake Anesthetic complications: no   No notable events documented.                Lauraine DASEN Colhoun

## 2024-07-29 NOTE — Discharge Summary (Signed)
 Patient ID: Rebecca [name] MRN: [id number] DOB/AGE: January 23, 1941 83 y.o.  Admit date: 07/28/2024 Discharge date: 07/29/2024  Admission Diagnoses:  Principal Problem:   Unilateral primary osteoarthritis, right hip Active Problems:   Status post total replacement of right hip   Discharge Diagnoses:  Same  Past Medical History:  Diagnosis Date   Anemia    Breast CA (HCC)    Left   GERD (gastroesophageal reflux disease)    History of cataract    History of hiatal hernia    History of radiation therapy    Right breast- 06/29/21-07/21/21- Dr. Lynwood Nasuti   Hypertension 2022   Hypothyroidism    OA (osteoarthritis)    Personal history of radiation therapy    PONV (postoperative nausea and vomiting)    Vitamin D  deficiency     Surgeries: Procedures: RIGHT TOTAL HIP ARTHHROPLASTY on 07/28/2024   Consultants:   Discharged Condition: Improved  Hospital Course: Rebecca [name] is an 83 y.o. female who was admitted 07/28/2024 for operative treatment ofUnilateral primary osteoarthritis, right hip. Patient has severe unremitting pain that affects sleep, daily activities, and work/hobbies. After pre-op clearance the patient was taken to the operating room on 07/28/2024 and underwent  Procedures: RIGHT TOTAL HIP ARTHHROPLASTY.    Patient was given perioperative antibiotics:  Anti-infectives (From admission, onward)    Start     Dose/Rate Route Frequency Ordered Stop   07/28/24 1330  ceFAZolin  (ANCEF ) IVPB 2g/100 mL premix        2 g 200 mL/hr over 30 Minutes Intravenous Every 6 hours 07/28/24 1156 07/28/24 1958   07/28/24 0600  ceFAZolin  (ANCEF ) IVPB 2g/100 mL premix        2 g 200 mL/hr over 30 Minutes Intravenous On call to O.R. 07/28/24 [id number] 07/28/24 0741        Patient was given sequential compression devices, early ambulation, and chemoprophylaxis to prevent DVT.  Inpatient Morphine  Milligram Equivalents Per Day 12/16 - 12/17   Values displayed are in units of MME/Day     Order Start / End Date Yesterday Today    oxyCODONE  (Oxy IR/ROXICODONE ) immediate release tablet 5 mg 12/16 - 12/16 0 of Unknown --    oxyCODONE  (ROXICODONE ) 5 MG/5ML solution 5 mg 12/16 - 12/16 0 of Unknown --      Group total: 0 of Unknown     fentaNYL  (SUBLIMAZE ) injection 25-50 mcg 12/16 - 12/16 0 of 45-90 --    HYDROcodone -acetaminophen  (NORCO/VICODIN) 5-325 MG per tablet 1-2 tablet 12/16 - No end date 15 of 20-40 0 of 30-60    HYDROcodone -acetaminophen  (NORCO) 7.5-325 MG per tablet 1-2 tablet 12/16 - No end date 15 of 30-60 45 of 45-90    morphine  (PF) 2 MG/ML injection 0.5-1 mg 12/16 - No end date 1.5 of 10.5-21 0 of 18-36    Daily Totals  31.5 of Unknown (at least 105.5-211) 45 of 93-186    Calculation Errors     Order Type Date Details   oxyCODONE  (Oxy IR/ROXICODONE ) immediate release tablet 5 mg Ordered Dose -- Insufficient frequency information   oxyCODONE  (ROXICODONE ) 5 MG/5ML solution 5 mg Ordered Dose -- Insufficient frequency information            Patient benefited maximally from hospital stay and there were no complications.    Recent vital signs: Patient Vitals for the past 24 hrs:  BP Temp Temp src Pulse Resp SpO2  07/29/24 0823 (!) 110/40 98 F (36.7 C) Oral (!) 54 16 95 %  07/29/24 0324 (!) 149/53 98.4 F (36.9 C) Oral 65 18 97 %  07/28/24 2224 (!) 164/57 -- -- (!) 56 18 --  07/28/24 1911 (!) 148/68 98.5 F (36.9 C) Oral (!) 57 18 99 %  07/28/24 1626 (!) 144/62 97.6 F (36.4 C) Oral (!) 58 19 100 %     Recent laboratory studies:  Recent Labs    07/29/24 0501  WBC 15.2*  HGB 11.9*  HCT 36.9  PLT 331  NA 140  K 5.1  CL 108  CO2 25  BUN 26*  CREATININE 1.31*  GLUCOSE 130*  CALCIUM 8.9     Discharge Medications:   Allergies as of 07/29/2024   No Known Allergies      Medication List     TAKE these medications    Acetaminophen  Extra Strength 500 MG Tabs Take 2 tablets (1,000 mg total) by mouth every 6 (six) hours.   alendronate 70  MG tablet Commonly known as: FOSAMAX Take 70 mg by mouth once a week.   AZO-DINE URINARY ANALGESIC PO Take 1 tablet by mouth once a week.   Eliquis  2.5 MG Tabs tablet Generic drug: apixaban  Take 1 tablet (2.5 mg total) by mouth 2 (two) times daily.   HYDROcodone -acetaminophen  5-325 MG tablet Commonly known as: NORCO/VICODIN Take 1-2 tablets by mouth every 6 (six) hours as needed for moderate pain (pain score 4-6).   levothyroxine  75 MCG tablet Commonly known as: SYNTHROID  Take 75 mcg by mouth daily before breakfast.   methocarbamol  500 MG tablet Commonly known as: ROBAXIN  Take 500 mg by mouth every 8 (eight) hours as needed for muscle spasms. What changed: Another medication with the same name was added. Make sure you understand how and when to take each.   methocarbamol  500 MG tablet Commonly known as: ROBAXIN  Take 1 tablet (500 mg total) by mouth every 6 (six) hours as needed for muscle spasms. What changed: You were already taking a medication with the same name, and this prescription was added. Make sure you understand how and when to take each.   metoprolol  tartrate 25 MG tablet Commonly known as: LOPRESSOR  Take 25 mg by mouth 2 (two) times daily.   omeprazole 20 MG tablet Commonly known as: PRILOSEC OTC Take 10 mg by mouth every other day.   SUPER B COMPLEX PO Take 1 tablet by mouth daily.   tamoxifen  20 MG tablet Commonly known as: NOLVADEX  TAKE 1/2 TABLET EVERY DAY   Vitamin D  50 MCG (2000 UT) Caps Take 1,000 Units by mouth daily.   Zinc  50 MG Tabs Take 50 mg by mouth 2 (two) times a week.   ZINC  PO Take 25 mg by mouth every other day.               Durable Medical Equipment  (From admission, onward)           Start     Ordered   07/28/24 1157  DME 3 n 1  Once        07/28/24 1156   07/28/24 1157  DME Walker rolling  Once       Question Answer Comment  Walker: With 5 Inch Wheels   Patient needs a walker to treat with the following  condition Status post total replacement of right hip      07/28/24 1156            Diagnostic Studies: DG Pelvis Portable Result Date: 07/28/2024 CLINICAL DATA:  Status post right hip replacement.  EXAM: PORTABLE PELVIS 1-2 VIEWS COMPARISON:  11/26/2023 FINDINGS: Right hip arthroplasty in expected alignment. No periprosthetic lucency or fracture. Recent postsurgical change includes air and edema in the soft tissues. Overlying skin staples in place. Previous left hip arthroplasty. Remote fracture of right inferior pubic ramus. IMPRESSION: Right hip arthroplasty without immediate postoperative complication. Electronically Signed   By: Andrea Gasman M.D.   On: 07/28/2024 13:00   DG HIP UNILAT WITH PELVIS 1V RIGHT Result Date: 07/28/2024 CLINICAL DATA:  Elective surgery. EXAM: DG HIP (WITH OR WITHOUT PELVIS) 1V RIGHT COMPARISON:  None Available. FINDINGS: Five fluoroscopic spot views of the pelvis and right hip obtained in the operating room. Sequential images during hip arthroplasty. Fluoroscopy time 24.6 seconds. Dose 1.6630 mGy. IMPRESSION: Intraoperative fluoroscopy during right hip arthroplasty. Electronically Signed   By: Andrea Gasman M.D.   On: 07/28/2024 09:02   DG C-Arm 1-60 Min-No Report Result Date: 07/28/2024 Fluoroscopy was utilized by the requesting physician.  No radiographic interpretation.    Disposition: Discharge disposition: 01-Home or Self Care          Follow-up Information     Vernetta Lonni GRADE, MD. Go on 08/10/2024.   Specialty: Orthopedic Surgery Why: at 10:30 am for your first post op appointment with Dr. Vernetta Pass information: [address] [place] [address] [phone]                  Signed: Lonni GRADE Vernetta 07/29/2024, 12:44 PM

## 2024-07-29 NOTE — Anesthesia Procedure Notes (Signed)
 Spinal  Patient location during procedure: OR Start time: 07/28/2024 7:40 AM End time: 07/28/2024 7:40 AM Reason for block: surgical anesthesia  Staffing Performed: anesthesiologist  Authorized by: Dene Lauraine DASEN, MD   Performed by: Dene Lauraine DASEN, MD  Preanesthetic Checklist Completed: patient identified, IV checked, site marked, risks and benefits discussed, surgical consent, monitors and equipment checked, pre-op evaluation and timeout performed Spinal Block Patient position: sitting Prep: DuraPrep Patient monitoring: heart rate, cardiac monitor, continuous pulse ox and blood pressure Approach: midline Location: L3-4 Injection technique: single-shot Needle Needle type: Pencan  Needle gauge: 24 G Needle length: 9 cm Assessment Sensory level: Pending. Events: CSF return  Additional Notes Patient identified. Risks/Benefits/Options discussed with patient including but not limited to bleeding, infection, nerve damage, paralysis, failed block, blood pressure changes. Confirmed with bedside nurse the patient's most recent platelet count. Confirmed with patient that they are not currently taking any anticoagulation, have any bleeding history or any family history of bleeding disorders. Sterile technique was used throughout the entire procedure as documented above. See intraoperative record for vital signs throughout.   CANDIE Dene, MD

## 2024-08-05 ENCOUNTER — Other Ambulatory Visit: Payer: Self-pay | Admitting: Orthopaedic Surgery

## 2024-08-05 ENCOUNTER — Telehealth: Payer: Self-pay | Admitting: Orthopaedic Surgery

## 2024-08-05 MED ORDER — HYDROCODONE-ACETAMINOPHEN 5-325 MG PO TABS: 1.0000 | ORAL_TABLET | Freq: Four times a day (QID) | ORAL | 0 refills | Status: DC | PRN

## 2024-08-05 MED ORDER — METHOCARBAMOL 500 MG PO TABS: 500.0000 mg | ORAL_TABLET | Freq: Four times a day (QID) | ORAL | 1 refills | Status: DC | PRN

## 2024-08-05 NOTE — Telephone Encounter (Signed)
 Pt called saying that she wants to refill her prescriptions. They are Methocarbamol  550 mg tablets, Hydrocodone /Acetaminophen  5-325. Pharmacy [facility] on [address]. Call back number is [phone]

## 2024-08-10 ENCOUNTER — Encounter: Payer: Self-pay | Admitting: Orthopaedic Surgery

## 2024-08-10 ENCOUNTER — Ambulatory Visit (INDEPENDENT_AMBULATORY_CARE_PROVIDER_SITE_OTHER): Admitting: Orthopaedic Surgery

## 2024-08-10 DIAGNOSIS — Z96641 Presence of right artificial hip joint: Secondary | ICD-10-CM

## 2024-08-10 NOTE — Progress Notes (Signed)
 The patient is here today for her first postoperative visit status post a right total hip replacement to treat significant right hip pain and arthritis.  We replaced her left hip back in July 2020.  She is on Eliquis  chronically.  She is an active 83 year old female.  Her son is with her today.  She is walking with a walker.  She does report that she did drive before surgery.  On exam her right hip incision looks good.  There is a small seroma and I did aspirate about 30 to 40 cc of fluid from the soft tissue.  Staples removed and Steri-Strips applied.  Her calf is soft.  She will slowly increase her activities as comfort allows.  She can drive but she does need to practice and have family comfortable allowing her to drive.  We will see her back in a month to see how she is doing overall but no x-rays are needed.

## 2024-09-07 ENCOUNTER — Ambulatory Visit: Admitting: Orthopaedic Surgery

## 2024-09-17 ENCOUNTER — Encounter: Payer: Self-pay | Admitting: Physician Assistant

## 2024-09-17 ENCOUNTER — Ambulatory Visit: Admitting: Physician Assistant

## 2024-09-17 DIAGNOSIS — Z96641 Presence of right artificial hip joint: Secondary | ICD-10-CM

## 2024-09-17 NOTE — Progress Notes (Signed)
 HPI: [name] returns today 7 weeks 2 days status post right total hip arthroplasty.  She states she is doing well no pain.  Reports that she was having some pain going up and down stairs but this is overall improving.  She is back to her normal activities.  She does have some discomfort mainly at night and takes some type of magnesium supplement that she feels this helps with.  Review of systems: See HPI otherwise negative.  Physical exam: General Well-developed well-nourished female who is able to get on and off the exam table on her own.  Ambulates without any assistive device. Psych: Alert and oriented x 3 Bilateral hips: Good range of motion of both hips without pain.  Slightly limited internal/external rotation of the right hip compared to full left.  Calves are supple nontender.  Dorsi flexion plantarflexion right ankle intact..  Impression: Status post right total hip arthroplasty 07/28/24  Plan: Will see her back at 6 months postop at that time we will obtain AP pelvis and lateral view of the right hip.  She will follow-up with us  sooner if there is any questions concerns.  Questions were encouraged and answered.

## 2025-07-06 ENCOUNTER — Inpatient Hospital Stay: Admitting: Hematology and Oncology
